# Patient Record
Sex: Male | Born: 1955 | Race: White | Hispanic: No | Marital: Married | Smoking: Never smoker
Health system: Southern US, Community
[De-identification: ages and names within clinical notes are randomized; demographics above are authoritative.]

## PROBLEM LIST (undated history)

## (undated) DIAGNOSIS — E669 Obesity, unspecified: Secondary | ICD-10-CM

## (undated) DIAGNOSIS — R609 Edema, unspecified: Secondary | ICD-10-CM

## (undated) DIAGNOSIS — G629 Polyneuropathy, unspecified: Secondary | ICD-10-CM

## (undated) DIAGNOSIS — R269 Unspecified abnormalities of gait and mobility: Secondary | ICD-10-CM

## (undated) DIAGNOSIS — Z87898 Personal history of other specified conditions: Secondary | ICD-10-CM

## (undated) DIAGNOSIS — K859 Acute pancreatitis without necrosis or infection, unspecified: Secondary | ICD-10-CM

## (undated) DIAGNOSIS — H269 Unspecified cataract: Secondary | ICD-10-CM

## (undated) DIAGNOSIS — G919 Hydrocephalus, unspecified: Secondary | ICD-10-CM

## (undated) DIAGNOSIS — G4733 Obstructive sleep apnea (adult) (pediatric): Secondary | ICD-10-CM

## (undated) DIAGNOSIS — M545 Low back pain, unspecified: Secondary | ICD-10-CM

## (undated) DIAGNOSIS — K831 Obstruction of bile duct: Secondary | ICD-10-CM

## (undated) DIAGNOSIS — F5104 Psychophysiologic insomnia: Secondary | ICD-10-CM

## (undated) DIAGNOSIS — I10 Essential (primary) hypertension: Secondary | ICD-10-CM

## (undated) DIAGNOSIS — Z85820 Personal history of malignant melanoma of skin: Secondary | ICD-10-CM

## (undated) DIAGNOSIS — R600 Localized edema: Secondary | ICD-10-CM

## (undated) DIAGNOSIS — Z9989 Dependence on other enabling machines and devices: Secondary | ICD-10-CM

## (undated) DIAGNOSIS — G8929 Other chronic pain: Secondary | ICD-10-CM

## (undated) HISTORY — DX: Obesity, unspecified: E66.9

## (undated) HISTORY — DX: Dependence on other enabling machines and devices: Z99.89

## (undated) HISTORY — DX: Low back pain: M54.5

## (undated) HISTORY — PX: CHOLECYSTECTOMY: SHX55

## (undated) HISTORY — DX: Acute pancreatitis without necrosis or infection, unspecified: K85.90

## (undated) HISTORY — DX: Polyneuropathy, unspecified: G62.9

## (undated) HISTORY — DX: Hydrocephalus, unspecified: G91.9

## (undated) HISTORY — DX: Localized edema: R60.0

## (undated) HISTORY — DX: Obstruction of bile duct: K83.1

## (undated) HISTORY — PX: CARPAL TUNNEL RELEASE: SHX101

## (undated) HISTORY — DX: Edema, unspecified: R60.9

## (undated) HISTORY — DX: Psychophysiologic insomnia: F51.04

## (undated) HISTORY — DX: Obstructive sleep apnea (adult) (pediatric): G47.33

## (undated) HISTORY — DX: Other chronic pain: G89.29

## (undated) HISTORY — PX: LUMBAR SPINE SURGERY: SHX701

## (undated) HISTORY — DX: Personal history of malignant melanoma of skin: Z85.820

## (undated) HISTORY — PX: TONSILLECTOMY: SUR1361

## (undated) HISTORY — DX: Essential (primary) hypertension: I10

## (undated) HISTORY — DX: Unspecified abnormalities of gait and mobility: R26.9

## (undated) HISTORY — PX: INGUINAL HERNIA REPAIR: SUR1180

## (undated) HISTORY — DX: Personal history of other specified conditions: Z87.898

## (undated) HISTORY — DX: Low back pain, unspecified: M54.50

---

## 1999-05-10 ENCOUNTER — Ambulatory Visit (HOSPITAL_COMMUNITY): Admission: RE | Admit: 1999-05-10 | Discharge: 1999-05-10 | Payer: Self-pay | Admitting: Emergency Medicine

## 1999-05-10 ENCOUNTER — Encounter: Payer: Self-pay | Admitting: Emergency Medicine

## 1999-06-16 ENCOUNTER — Encounter: Payer: Self-pay | Admitting: Neurosurgery

## 1999-06-18 ENCOUNTER — Observation Stay (HOSPITAL_COMMUNITY): Admission: RE | Admit: 1999-06-18 | Discharge: 1999-06-19 | Payer: Self-pay | Admitting: Neurosurgery

## 1999-06-18 ENCOUNTER — Encounter: Payer: Self-pay | Admitting: Neurosurgery

## 1999-10-15 ENCOUNTER — Ambulatory Visit (HOSPITAL_COMMUNITY): Admission: RE | Admit: 1999-10-15 | Discharge: 1999-10-15 | Payer: Self-pay | Admitting: Neurosurgery

## 1999-10-15 ENCOUNTER — Encounter: Payer: Self-pay | Admitting: Neurosurgery

## 2000-12-07 ENCOUNTER — Inpatient Hospital Stay (HOSPITAL_COMMUNITY): Admission: EM | Admit: 2000-12-07 | Discharge: 2000-12-08 | Payer: Self-pay | Admitting: *Deleted

## 2000-12-07 ENCOUNTER — Encounter: Payer: Self-pay | Admitting: Emergency Medicine

## 2001-06-26 ENCOUNTER — Inpatient Hospital Stay (HOSPITAL_COMMUNITY): Admission: EM | Admit: 2001-06-26 | Discharge: 2001-06-28 | Payer: Self-pay | Admitting: Emergency Medicine

## 2001-09-22 ENCOUNTER — Ambulatory Visit (HOSPITAL_BASED_OUTPATIENT_CLINIC_OR_DEPARTMENT_OTHER): Admission: RE | Admit: 2001-09-22 | Discharge: 2001-09-22 | Payer: Self-pay | Admitting: Orthopedic Surgery

## 2001-11-24 ENCOUNTER — Emergency Department (HOSPITAL_COMMUNITY): Admission: EM | Admit: 2001-11-24 | Discharge: 2001-11-24 | Payer: Self-pay | Admitting: Emergency Medicine

## 2002-06-06 ENCOUNTER — Encounter: Payer: Self-pay | Admitting: Emergency Medicine

## 2002-06-06 ENCOUNTER — Inpatient Hospital Stay (HOSPITAL_COMMUNITY): Admission: EM | Admit: 2002-06-06 | Discharge: 2002-06-11 | Payer: Self-pay | Admitting: Emergency Medicine

## 2002-06-07 ENCOUNTER — Encounter: Payer: Self-pay | Admitting: Internal Medicine

## 2003-12-27 ENCOUNTER — Inpatient Hospital Stay (HOSPITAL_COMMUNITY): Admission: EM | Admit: 2003-12-27 | Discharge: 2004-01-01 | Payer: Self-pay | Admitting: Emergency Medicine

## 2004-05-06 ENCOUNTER — Inpatient Hospital Stay (HOSPITAL_COMMUNITY): Admission: EM | Admit: 2004-05-06 | Discharge: 2004-05-13 | Payer: Self-pay | Admitting: Emergency Medicine

## 2004-05-11 ENCOUNTER — Encounter (INDEPENDENT_AMBULATORY_CARE_PROVIDER_SITE_OTHER): Payer: Self-pay | Admitting: *Deleted

## 2004-05-22 ENCOUNTER — Ambulatory Visit (HOSPITAL_BASED_OUTPATIENT_CLINIC_OR_DEPARTMENT_OTHER): Admission: RE | Admit: 2004-05-22 | Discharge: 2004-05-22 | Payer: Self-pay | Admitting: Family Medicine

## 2005-04-03 ENCOUNTER — Inpatient Hospital Stay (HOSPITAL_COMMUNITY): Admission: EM | Admit: 2005-04-03 | Discharge: 2005-04-06 | Payer: Self-pay | Admitting: Internal Medicine

## 2006-04-11 ENCOUNTER — Encounter: Admission: RE | Admit: 2006-04-11 | Discharge: 2006-05-11 | Payer: Self-pay | Admitting: Neurology

## 2009-03-06 ENCOUNTER — Ambulatory Visit (HOSPITAL_COMMUNITY): Admission: RE | Admit: 2009-03-06 | Discharge: 2009-03-06 | Payer: Self-pay | Admitting: General Surgery

## 2009-04-08 ENCOUNTER — Encounter (INDEPENDENT_AMBULATORY_CARE_PROVIDER_SITE_OTHER): Payer: Self-pay | Admitting: General Surgery

## 2009-04-08 ENCOUNTER — Ambulatory Visit (HOSPITAL_COMMUNITY): Admission: RE | Admit: 2009-04-08 | Discharge: 2009-04-08 | Payer: Self-pay | Admitting: General Surgery

## 2009-12-01 ENCOUNTER — Inpatient Hospital Stay (HOSPITAL_COMMUNITY): Admission: EM | Admit: 2009-12-01 | Discharge: 2009-12-05 | Payer: Self-pay | Admitting: Emergency Medicine

## 2009-12-03 ENCOUNTER — Encounter (INDEPENDENT_AMBULATORY_CARE_PROVIDER_SITE_OTHER): Payer: Self-pay | Admitting: General Surgery

## 2009-12-03 ENCOUNTER — Encounter: Payer: Self-pay | Admitting: General Surgery

## 2011-01-06 LAB — COMPREHENSIVE METABOLIC PANEL
ALT: 102 U/L — ABNORMAL HIGH (ref 0–53)
ALT: 139 U/L — ABNORMAL HIGH (ref 0–53)
ALT: 95 U/L — ABNORMAL HIGH (ref 0–53)
AST: 38 U/L — ABNORMAL HIGH (ref 0–37)
Albumin: 3.3 g/dL — ABNORMAL LOW (ref 3.5–5.2)
Albumin: 3.7 g/dL (ref 3.5–5.2)
Alkaline Phosphatase: 101 U/L (ref 39–117)
Alkaline Phosphatase: 68 U/L (ref 39–117)
BUN: 12 mg/dL (ref 6–23)
BUN: 12 mg/dL (ref 6–23)
BUN: 8 mg/dL (ref 6–23)
CO2: 28 mEq/L (ref 19–32)
Calcium: 8.3 mg/dL — ABNORMAL LOW (ref 8.4–10.5)
Calcium: 8.8 mg/dL (ref 8.4–10.5)
Chloride: 103 mEq/L (ref 96–112)
Creatinine, Ser: 0.95 mg/dL (ref 0.4–1.5)
GFR calc Af Amer: >60 mL/min (ref >60)
GFR calc non Af Amer: >60 mL/min (ref >60)
Glucose, Bld: 115 mg/dL — ABNORMAL HIGH (ref 70–99)
Potassium: 3.8 mEq/L (ref 3.5–5.1)
Potassium: 4.1 mEq/L (ref 3.5–5.1)
Sodium: 135 mEq/L (ref 135–145)
Total Bilirubin: 0.8 mg/dL (ref 0.3–1.2)
Total Protein: 6.6 g/dL (ref 6.0–8.3)
Total Protein: 7.3 g/dL (ref 6.0–8.3)

## 2011-01-06 LAB — CBC
HCT: 40.9 % (ref 39.0–52.0)
HCT: 41.4 % (ref 39.0–52.0)
Hemoglobin: 14.2 g/dL (ref 13.0–17.0)
MCHC: 34.2 g/dL (ref 30.0–36.0)
MCV: 96.2 fL (ref 78.0–100.0)
Platelets: 207 10*3/uL (ref 150–400)
RDW: 13.6 % (ref 11.5–15.5)
RDW: 13.6 % (ref 11.5–15.5)
RDW: 13.9 % (ref 11.5–15.5)
WBC: 10.5 10*3/uL (ref 4.0–10.5)
WBC: 7.9 10*3/uL (ref 4.0–10.5)

## 2011-01-06 LAB — URINALYSIS, ROUTINE W REFLEX MICROSCOPIC
Bilirubin Urine: NEGATIVE
Hgb urine dipstick: NEGATIVE
Ketones, ur: NEGATIVE mg/dL
Nitrite: NEGATIVE
Protein, ur: NEGATIVE mg/dL
pH: 7 (ref 5.0–8.0)

## 2011-01-06 LAB — PROTIME-INR
INR: 1.22 (ref 0.00–1.49)
Prothrombin Time: 15.3 seconds — ABNORMAL HIGH (ref 11.6–15.2)

## 2011-01-06 LAB — DIFFERENTIAL
Basophils Relative: 1 % (ref 0–1)
Eosinophils Relative: 2 % (ref 0–5)
Lymphocytes Relative: 10 % — ABNORMAL LOW (ref 12–46)
Lymphs Abs: 1 10*3/uL (ref 0.7–4.0)
Monocytes Absolute: 0.3 10*3/uL (ref 0.1–1.0)
Neutro Abs: 8.9 10*3/uL — ABNORMAL HIGH (ref 1.7–7.7)

## 2011-01-06 LAB — AMYLASE: Amylase: 39 U/L (ref 0–105)

## 2011-01-06 LAB — GLUCOSE, CAPILLARY: Glucose-Capillary: 108 mg/dL — ABNORMAL HIGH (ref 70–99)

## 2011-01-06 LAB — LIPASE, BLOOD: Lipase: 97 U/L — ABNORMAL HIGH (ref 11–59)

## 2011-01-08 LAB — COMPREHENSIVE METABOLIC PANEL
AST: 49 U/L — ABNORMAL HIGH (ref 0–37)
Albumin: 3.2 g/dL — ABNORMAL LOW (ref 3.5–5.2)
CO2: 26 mEq/L (ref 19–32)
Chloride: 102 mEq/L (ref 96–112)
GFR calc Af Amer: >60 mL/min (ref >60)
GFR calc non Af Amer: >60 mL/min (ref >60)
Potassium: 4.2 mEq/L (ref 3.5–5.1)
Sodium: 134 mEq/L — ABNORMAL LOW (ref 135–145)
Total Bilirubin: 1.6 mg/dL — ABNORMAL HIGH (ref 0.3–1.2)

## 2011-01-08 LAB — DIFFERENTIAL
Basophils Absolute: 0 10*3/uL (ref 0.0–0.1)
Basophils Relative: 0 % (ref 0–1)
Monocytes Relative: 6 % (ref 3–12)
Neutro Abs: 9.2 10*3/uL — ABNORMAL HIGH (ref 1.7–7.7)

## 2011-01-08 LAB — CBC: Hemoglobin: 13.8 g/dL (ref 13.0–17.0)

## 2011-01-14 ENCOUNTER — Other Ambulatory Visit: Payer: Self-pay

## 2011-01-25 LAB — COMPREHENSIVE METABOLIC PANEL
ALT: 35 U/L (ref 0–53)
AST: 25 U/L (ref 0–37)
CO2: 26 mEq/L (ref 19–32)
Chloride: 107 mEq/L (ref 96–112)
Creatinine, Ser: 0.85 mg/dL (ref 0.4–1.5)
GFR calc Af Amer: >60 mL/min (ref >60)
GFR calc non Af Amer: >60 mL/min (ref >60)
Total Bilirubin: 0.9 mg/dL (ref 0.3–1.2)

## 2011-01-25 LAB — CBC
HCT: 48.3 % (ref 39.0–52.0)
MCHC: 33.6 g/dL (ref 30.0–36.0)
MCV: 95.9 fL (ref 78.0–100.0)
Platelets: 186 10*3/uL (ref 150–400)
RBC: 5.04 MIL/uL (ref 4.22–5.81)
RDW: 13.8 % (ref 11.5–15.5)

## 2011-01-25 LAB — DIFFERENTIAL
Basophils Absolute: 0 10*3/uL (ref 0.0–0.1)
Eosinophils Absolute: 0.5 10*3/uL (ref 0.0–0.7)
Eosinophils Relative: 6 % — ABNORMAL HIGH (ref 0–5)
Lymphocytes Relative: 23 % (ref 12–46)

## 2011-02-03 ENCOUNTER — Other Ambulatory Visit: Payer: Self-pay

## 2011-02-12 ENCOUNTER — Ambulatory Visit
Admission: RE | Admit: 2011-02-12 | Discharge: 2011-02-12 | Disposition: A | Discharge status: Home / Self Care | Payer: Medicare Other | Source: Ambulatory Visit | Attending: Family Medicine | Admitting: Family Medicine

## 2011-02-12 ENCOUNTER — Other Ambulatory Visit: Payer: Self-pay | Admitting: Family Medicine

## 2011-02-12 DIAGNOSIS — R52 Pain, unspecified: Secondary | ICD-10-CM

## 2011-03-02 NOTE — Op Note (Signed)
[name], [name]                  ACCOUNT NO.:  [id number]   MEDICAL RECORD NO.:  [id number]          PATIENT TYPE:  AMB   LOCATION:  SDS                          FACILITY:  [facility]   PHYSICIAN:  Cherylynn Ridges, M.D.    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   PROCEDURES:  1. Excision of left upper back melanoma.  2. Excision of right upper back spreading pigmented lesion.  3. Left axillary sentinel lymph node biopsy.  4. Lymphatic mapping.   SURGEON:  Marta Lamas. Lindie Spruce, MD   ANESTHESIA:  General endotracheal.   ESTIMATED BLOOD LOSS:  Less than 50 mL.  It was done in a prone position  and placed back into the supine position.   COMPLICATIONS:  None.   CONDITION:  Stable.   FINDINGS:  Positive by NeoProbe sentinel lymph node with high counts and  some pigmentation from Lymphazurin.   INDICATIONS FOR OPERATION:  The patient is a 55 year old gentleman with  a diagnosis of melanoma of the left back.  He comes in for excision of  that plus a suspicious lesion in the right upper back.   OPERATION IN DETAIL:  The patient was taken to the operating room and  placed on table initially in the supine position.  After an adequate  endotracheal anesthetic was administered, he was flipped over into the  prone position on the table for preparation and injection with  Lymphazurin around the melanotic lesion.   The lesion on his left upper back was identified and we injected 5 mL of  Lymphazurin in the upper and lateral border of the lesion.  We rubbed  that in using a sponge and  then we went to excise the lesion on the  right back which was a speckled, fading pigmented lesion resembling a  superficial spreading melanoma.   We marked the borders which were approximately 1 cm-1.5 cm from the edge  of the pigmented lesion.  We made an oval incision using #10 blade and  taken down to the muscle fascia.  We used electrocautery to cut off the  lesion down to the fascia and then excised it completely.  We marked the  medial border and a deep border with a nylon suture.  The longer suture  being on the medial border.  We obtained hemostasis with electrocautery.  Then we closed in 2 layers of 3-0 Vicryl deep layer, then the skin was  closed using interrupted simple stitches of 3-0 nylon.  The total length  of the incision on the right side was about 13 cm long.   We excised the melanotic lesion in a similar manner getting a 2-cm  margin at least circumferentially.  We excised it out to the deep  fascia, however, our initial borders were somewhat slanted inward,  therefore we took the wall more lateral and deep margin.  We excised  down to the fascia, obtained hemostasis with electrocautery, and then  closed in 2 layers of 3-0 Vicryl deep layer and then the skin using  interrupted simple stitches of 2-0 nylon.  The total length of this was  approximately 15 cm.   Once we had completely excised these and placed Betadine ointment, 4 x  4s, and Hypafix dressing on both these lesions, we flipped the patient  on into the supine position with his left arm extended in 90 degrees to  his body.  We then prepped in usual manner with ChloraPrep and then  draped.  We identified the area of hyperactivity with the NeoProbe in  the lower portion of the left axilla.  We made an incision transversely  using the #15 blade and dissected into the subcu using electrocautery.  We used a Neoprobe to guide [place] to the highest level of activity where we  did find a group of lymph nodes which had  the highest activity of over  500 counts with the gain being 100.  No other areas had similar activity  once this area had been removed and it continued to show high level of  activity after it was removed.   What was removed appeared to be a group of about 3 lymph nodes, however,  the highest activity was within this group.  We obtained hemostasis with  electrocautery, then closed with 3-0 Vicryl deep subcu layer, and then  the skin was closed using stainless steel staples.  Betadine ointment, 4  x 4s, and Hypafix dressing was applied.  All counts were correct  including needles, sponges, and instruments.      Cherylynn Ridges, M.D.  Electronically Signed     JOW/MEDQ  D:  04/08/2009  T:  04/09/2009  Job:  [id number]   cc:   Joni Fears D. Maple Hudson, MD, FCCP, FACP

## 2011-03-05 NOTE — Discharge Summary (Signed)
[name], [name]                              ACCOUNT NO.:  [id number]   MEDICAL RECORD NO.:  [id number]                   PATIENT TYPE:  INP   LOCATION:  3030                                 FACILITY:  [facility]   PHYSICIAN:  Isla Pence, M.D.             DATE OF BIRTH:  [birth year]   DATE OF ADMISSION:  05/06/2004  DATE OF DISCHARGE:  05/13/2004                                 DISCHARGE SUMMARY   DISCHARGE DIAGNOSES:  1. Left lower extremity cellulitis secondary to chronic venous stasis.  2. Atypical chest pain, ruled out for myocardial infarction and pulmonary     embolus.  3. Question of obstructive sleep apnea or other sleep disorder.  Recommend     outpatient referral to pulmonary for workup of this.  4. History of previous deep venous thrombosis with cellulitis.  5. History of hypertension.  6. History of lumbar disc disease, status post surgery in 2000, at L1-L2 and     also at S1, currently with disease of the L2-L3 and L4-L5 that Dr. Luiz Blare     is following him up on.  The patient has chronic neuropathy as a result     of this lumbar disc disease and chronic sense of paresthesias in his     lower extremity.  7. Previous history of heme positive stools with colonoscopy done in     [place], [place], by Dr. Donnie Coffin, all of which were apparently     normal.  In 2003, it was followed by a barium enema which was apparently     also negative.  Positive findings were for diverticulosis.  8. History of status post tonsillectomy.  9. Left hydrocele repair.  10.      Surgery to L1-L2 and S1 in 2000.  11.      Status post left inguinal repair in 1997.  12.      History of insomnia.   DISCHARGE MEDICATIONS:  1. Keflex 500 mg p.o. q.i.d. x3 more days.  This will complete a 10 day     course of antibiotics for his left lower extremity cellulitis.  2. Lasix 40 mg p.o. q.d.  This is an increased dose from his previous of 20.  3. Toprol XL 100 mg p.o. q.d.  4. Altace 10 mg  p.o. b.i.d.  This is a decrease from 15 mg p.o. b.i.d. that     he came in with.  5. Neurontin 300 mg tablets, three tablets p.o. q.d. either q.a.m. or     q.h.s., it depends on him.  6. Elavil 25 mg two tablets p.o. q.h.s.  7. Ambien 5 mg p.o. every other day per patient.  8. Celebrex 400 mg p.o. q.d. with meals as before that was prescribed by Dr.     Luiz Blare.  9. Ultram 100 mg p.o. q.6h. as before.  10.      Triamcinolone cream as before.  He is to use it sparingly whenever     he has crusting on his left lower extremity.  He has been advised against     using it chronically every day.  11.      Prescription for Protonix 40 mg p.o. q.d. in case his stomach gets     upset while on Celebrex.  12.      Small hiatal hernia as seen on computed tomography of chest.   ACTIVITY:  The patient is to keep his lower extremities up when seated or in  bed.  I have also advised him not to wear his compression stockings until he  is done with his antibiotics.   DIET:  Low salt diet.   FOLLOW UP:  Follow up with Dr. Theresia Lo next week.  He will need a potassium  checked at that time so a BMET will need to be done.  Also, suggest referral  to pulmonary for workup of possible sleep apnea.   HOSPITAL COURSE:  This 55 year old gentleman, who is morbidly obese with  previous history of DVT in his lower extremity with chronic venous stasis,  comes in with complaints of atypical chest pain and also increasing warmth  and swelling in his left leg.  Please refer to the initial H&P for details  on the presentation of that.  The patient was brought in and started on IV  antibiotics.  He was initially on ceftriaxone and subsequently switched to  IV Kefzol which he responded nicely with.  He was then subsequently switched  to oral Keflex and has continued to respond to that.  His initial white  count was 25.9, H&H 17.2 and 49.9, platelet count 155,000.  On his  differential at the time of admission, he had  95% neutrophils and 3% lymphs.  His last white count done on the day of discharge after being on oral Keflex  for a few days shows a white count of 7.5, H&H 14.7 and 42.8, platelet count  of 242,000.  As part of his workup for his lower extremity swelling with  chronic venous stasis, he did have CT of his extremity as part of the  protocol for rule out PE which was not conclusive for excluding DVT  secondary to the swelling.  Therefore, a Doppler ultrasound was done which  showed no evidence of acute DVT bilaterally from the upper calf to the  common femoral vein.  Proximal to mid calf veins were not clearly visualized  secondary to swelling.  There was old fluid collection noted at the distal  medial ankle.  We felt overall comfortable that we were not dealing with an  acute DVT since this is a recurrent problem for the patient.  He was,  however, placed on Lovenox protocol initially and at the time of discharge,  he will be taken off of the Lovenox.  The patient was started on IV  diuretics to diurese some of his lower extremity edema which will help in  the long run with his cellulitis.  He was subsequently switched to oral  Lasix.   In regards to his atypical chest pain, the patient was ruled out for  myocardial infarction using serial cardiac enzymes and all were fairly  unremarkable with his highest CPK being 127 and the highest troponin being  0.01.  In light of his morbid obesity and his chronic venous stasis and his  previous DVT, spiral CT of the chest for PE protocol was obtained and that  was negative for pulmonary embolism.  Noted on the CT was a small hiatal  hernia.  A few days into his stay, the patient apparently got a little  wheezy and it was felt that maybe he was having cardiac asthma.  It was  during that time that the possibility of obstructive sleep apnea was obtained.  The patient apparently snores and this was verified by the wife  and that he does not have a  good night rest.  He actually, in fact, takes  Ambien and Elavil to help him with sleep.  We went ahead and did a 2D  echocardiogram to see if there was any suggestion of right ventricular  dysfunction or left ventricular diastolic dysfunction or any suggestion of  pulmonary hypertension.  Overall, the left ventricular systolic function was  normal.  His left ventricular size was normal.  His left ventricular  ejection fraction was 55-65%.  It was an inadequate study for left  ventricular regional wall motion which we were not looking for.  His left  ventricular wall thickness was at the upper limits of normal.  There was no  significant aortic valve disease noted.  There was no AI or AS.  There was  trivial mitral regurgitation.  The right ventricle was not visualized  clearly and the pulmonic valve was not visualized properly either.  Left  atrial size was on the upper limits of normal.  Based on this, we felt that  this was not likely cardiac in origin and that he probably might have a  component of obstructive sleep apnea.  Dr. Nehemiah Settle had given him 80 mg of  Lasix IV and he responded very nicely with good diuresis and was better for  that period of time.  He was on nebulizer treatments.  At the time of  discharge, the patient is feeling fine.  Based on this information and to  get a little bit more adequate diuresis of this legs and if he does have  pulmonary hypertension, which was not mentioned on the echocardiogram, his  Lasix dose is increased to 40 mg p.o. q.d.  Because his blood pressures had  come down with this regimen, his Altace was decreased down to 10 mg p.o.  b.i.d.  This can be modified by Dr. Theresia Lo.   In regards to his hypertension, as mentioned earlier because of the Lasix  dose being increased, we went ahead and decreased his Altace down since his  blood pressure was trending down.  At the time of discharge, in the morning,  his blood pressure is 104/54 and the  patient is feeling fine.   The patient, in spite of his diuresis, did need slight potassium  supplementation because of the IV Lasix.  He was then switched to oral Lasix  and without any additional supplementation, his potassium is keeping up  nicely with his BMET on July 26, showing a sodium of 134, potassium 4.1,  chloride 97, CO2 27, glucose 117, BUN 12, creatinine 0.9 with a calcium of  8.6.   The patient did get two sets of blood cultures at the time of admission and  these have remained negative x5 days.  The patient was given prescriptions  for all of his medications except for Celebrex which he says he has.  I will  also give him his prescription for Ultram at least to cover x2 weeks until  he sees Dr. Theresia Lo next week.  Isla Pence, M.D.    RRV/MEDQ  D:  05/13/2004  T:  05/13/2004  Job:  [id number]  cc:   Vikki Ports, M.D.  [address] [place] [place]  [place]  [place] [address]  Fax: [phone]   Harvie Junior, M.D.  [address]  [place]  [place] [address]  Fax: [phone]

## 2011-03-05 NOTE — Procedures (Signed)
[name], [name]                  ACCOUNT NO.:  [id number]   MEDICAL RECORD NO.:  [id number]          PATIENT TYPE:  OUT   LOCATION:  SLEEP CENTER                 FACILITY:  [facility]   PHYSICIAN:  Clinton D. Maple Hudson, M.D. DATE OF BIRTH:  10/31/1955   DATE OF ADMISSION:  05/22/2004  DATE OF DISCHARGE:  05/22/2004                              NOCTURNAL POLYSOMNOGRAM   REFERRING PHYSICIAN:  Dr. Lanell Persons   INDICATION FOR STUDY:  Hypersomnia with sleep apnea.   NECK SIZE:  17.5 inches   BODY MASS INDEX:  38.3   WEIGHT:  290 pounds   EPWORTH SLEEPINESS SCORE:  9/24   MEDICATIONS LISTED:  Altace, Toprol, Protonix, Lasix, Ultram, Elavil,  Celebrex, aspirin.   SLEEP ARCHITECTURE:  Total sleep time recorded 351 minutes for sleep  efficiency of 71%.  Stage I was 11%.  Stage II 45%.  Stages III and IV 3%.  REM was 12% of total sleep time.  Latency to sleep onset 42 minutes.  Latency to REM 207 minutes.  Awake after sleep onset 106 minutes reflecting  sustained waking for about two hours after midnight.  Arousal index was 31x  per hour.  The technician indicated that the patient had difficulty  maintaining sleep, but does not state whether he took his Elavil at bedtime.   RESPIRATORY DATA:  Split study protocol.  RDI 55 per hour consistent with  severe obstructive sleep apnea/hypopnea syndrome before CPAP.  This included  49 obstructive apneas and 44 hypopneas before CPAP.  Events were not  positional.  REM RDI was 3 per hour.  CPAP was titrated to 12 CDP, RDI 0 per  hour.  A large comfort curve mask was used with heated humidifier.  He  tolerated the CPAP pressure well, but could not maintain sleep easily with  this initial exposure to CPAP.   OXYGEN DATA:  Moderate snoring before CPAP with moderate to severe oxygen  desaturation to 77%.  At final CPAP pressures, oxygenation was being  maintained at 92-93%.  Baseline room air saturation while awake were 97%.   CARDIAC DATA:   Normal cardiac rhythm.   MOVEMENT/PARASOMNIA:  Occasional leg jerks with insignificant effect on  sleep.   IMPRESSION/RECOMMENDATION:  Severe obstructive sleep apnea/hypopnea  syndrome, RDI 55 per hour.  Good control at CPAP 12 CWP using a large  comfort curve mask with heated humidifier.  Difficulty maintaining sleep  before and after CPAP application.  Suggest providing sedative hypnotic  sleep medication during initial home trial of CPAP if appropriate.                                   ______________________________                                Rennis Chris. Maple Hudson, M.D.                                Diplomate, American  Board of Sleep Medicine    CDY/MEDQ  D:  05/31/2004 10:49:37  T:  05/31/2004 23:16:19  Job:  [id number]

## 2011-03-05 NOTE — H&P (Signed)
NAMEOSHEN, WLODARCZYK                              ACCOUNT NO.:  [id number]   MEDICAL RECORD NO.:  [id number]                   PATIENT TYPE:  EMS   LOCATION:  MINO                                 FACILITY:  [facility]   PHYSICIAN:  Isla Pence, M.D.             DATE OF BIRTH:  11-02-1955   DATE OF ADMISSION:  12/26/2003  DATE OF DISCHARGE:                                HISTORY & PHYSICAL   IDENTIFICATION STATEMENT:  This is a 55 year old gentleman whose primary  care physician is Dr. Theresia Lo.   CHIEF COMPLAINT:  Swelling and redness in the left leg times one day.   HISTORY OF PRESENT ILLNESS:  This is a gentleman with previous history of  left lower extremity cellulitis complicated with bacteremia with Strep  agalactiae in August 2003, and with history of chronic venous stasis comes  to the emergency room on the evening of March 10 with complaints of redness  and swelling.  The initial redness and swelling started out in the morning  of March 10, and since it progressed further, he decided to come on in  knowing his previous history.  He denies any previous injury, no known  fever.  He has paresthesias in his legs; therefore, he has no feelings in  his legs to complain of any pain.  He just has felt a sense of tightness in  his ankles.  The patient, also of significance, does have history of DVT in  both legs and that was diagnosed in early 2004 by Doppler ultrasound and he  was on Lovenox for six months.  He has had no other recurrence since then.   The patient denies any complaints of shortness of breath or chest pain,  although he says he has a sense of chest pressure when he lays on his right  to left side but that has resolved.   ALLERGIES:  No known drug allergies.   CURRENT MEDICATIONS:  1. Altace 10 mg p.o. b.i.d.  2. Norvasc 5 mg and he takes two p.o. daily. He has been on Norvasc for the     past year, he said.  3. Toprol XL 50 mg two p.o. daily.  4.  Hydrochlorothiazide 25 mg p.o. daily.  5. Neurontin 300 mg size tablets.  He takes one p.o. q.a.m., two tablets     p.o. at noon, 5 p.o. q.h.s.  6. Elavil 25 mg two tablets p.o. at night for sleep.  7. Ambien 5 mg one every other day at bedtime.  8. He stopped Celebrex recently for his chronic pain also.   PAST MEDICAL HISTORY:  1. As mentioned earlier, in August 2003, he was admitted for the left lower     extremity cellulitis with positive blood cultures for Strep agalactiae at     that time.  He was initially on Ancef and then subsequently switched to     ceftriaxone and  left him on Ceftin.  2. History of hypertension with his Toprol apparently just being recent     increased by Dr. Theresia Lo.  3. History of lumbar disk disease, status post surgery to the back pain,     with chronic pain in his back.  4. He was admitted in February 2002 with chest pains and underwent     Cardiolite and cardiac catheterization by Dr. Fraser Din which was     essentially negative for any coronary artery disease.  He had an EF of     70% at that time.  5. He denies history of irritable bowel, although there is mention of it in     his E-chart, but he definitely denies this.  6. History of insomnia.  7. History of heme-positive stool with colonoscopies four different times in     [place] by Dr. Donnie Coffin.  Last one was done in 2003 that had to be     followed with barium enema study because the prep was not fully clean,     but in any case all of his colonoscopies were negative for polyps or     malignancies.  Was positive just for hemorrhoids and diverticulosis.  8. As mentioned earlier, history of DVT diagnosed in early 2004 in both his     legs.  Was on Lovenox for six months.   PAST SURGICAL HISTORY:  1. He is status post tonsillectomy.  2. Status post left hydrocele repair.  3. Status post back surgery to the lumbar area back in August 2000.  4. Status post left inguinal hernia repair in 1977.    SOCIAL HISTORY:  He has been married for the past 20 years.  He has two  children, a boy and a girl.  Denies tobacco. He has an occasional  cigarette when guys have a baby.  May have an occasional glass of wine but  does not make a habit of drinking alcohol.  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   REVIEW OF SYSTEMS:  As per HPI, but otherwise denies any change in his  urinary symptoms, it sounds like related to his nocturia, and this can be  further evaluated by Dr. Theresia Lo.  Otherwise denies any melena,  hematochezia.   PHYSICAL EXAMINATION:  VITAL SIGNS:  His initial blood pressure was 159/93,  pulse 104, respiratory rate 24, temperature 97.6.  Subsequent blood pressure  138/83 and a heart rate of 85.  Most recent one which I think may have been  taken when he was asleep showed a blood pressure of 90/40 which I do not  really think is correct for him.  His saturations have been running 94-97%.  GENERAL:  He is in no apparent distress.  He is an obese gentleman, very  pleasant man.  LUNGS:  Clear to auscultation bilaterally without any crackles or wheezes.  HEART:  Regular rate and rhythm without any murmurs or rubs.  ABDOMEN:  Protuberant, large, obese abdomen.  Bowel sounds are normal.  Soft, nontender.  No appreciable organomegaly.  EXTREMITIES:  Lower extremity exam is chronic venous stasis changes in both  legs.  Left lower extremity has area of redness, just slightly below the  knee but involving the calf and the shin areas and maybe slightly into the  ankle.  It is certainly warm to touch but he cannot feel pain.   LABORATORY DATA:  He had a white count of 6.3, hemoglobin 16.2, hematocrit  46.9, platelet count  222,000.  He had normal differential.  His BMP showed a  sodium of 138, potassium 3.6, chloride and CO2 99 and 32, BUN 16, creatinine  1.2, glucose 95.   ASSESSMENT/PLAN: 1. Left lower extremity cellulitis with chronic venous stasis with a history      of deep venous thrombosis.  Will admit him for IV antibiotics.  He was     given Avelox in the emergency room but will use ceftriaxone on him since     he had done well with that in the past.  Will also set him up for a     Doppler of the lower extremities to rule out a deep venous thrombosis.     If that becomes positive for a new deep venous thrombosis, then he buys     himself chronic long-term anticoagulation, especially with his chronic     venous stasis.   Will use Lovenox subcutaneously until the deep venous thrombosis is ruled  out or he becomes a little bit more ambulatory.  Will also discontinue the  Norvasc since it can cause venous stasis and he is already having problems  with it.  Blood cultures have not been done at this time since his white  count really is essentially normal in the past when he came in 2003 and when  is white count was significantly elevated.   1. History of hypertension.  Since I am discontinuing the Norvasc, will     increase his Altace, continue his other medicines.   1. He had some transient diarrhea which has since resolved.   1. History of chronic pain, neuropathy secondary to disk disease.  Will     continue all of his medications.   1. History of insomnia.  Continue his current medications.   1. Nocturia.  We will leave this to Dr. Theresia Lo to work up as an     outpatient, looking for benign prostatic hypertrophy-like symptoms.                                                Isla Pence, M.D.    RRV/MEDQ  D:  12/27/2003  T:  12/28/2003  Job:  [id number]   cc:   Vikki Ports, M.D.  [address]  [place]  [place] [address]  Fax: [phone]
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
## 2011-03-05 NOTE — H&P (Signed)
NAMEJAMAAL, [name]                              ACCOUNT NO.:  [id number]   MEDICAL RECORD NO.:  [id number]                   PATIENT TYPE:  INP   LOCATION:  1826                                 FACILITY:  [facility]   PHYSICIAN:  [name], M.D.             DATE OF BIRTH:  1956-09-18   DATE OF ADMISSION:  05/06/2004  DATE OF DISCHARGE:                                HISTORY & PHYSICAL   IDENTIFYING INFORMATION/JUSTIFICATION FOR ADMISSION AND CARE:  This is a 55-  year-old gentleman who is morbidly obese whose primary care physician is Dr.  Theresia [name] with [facility], whose orthopedic doctor is Dr. Luiz [name], whose chief  complaint is chest pain and left lower extremity swelling and warmth this  morning.   HISTORY OF PRESENT ILLNESS:  This patient with history of recurrent DVTs was  seen at the [facility] by one of Dr. Aurelio [name] partners.  The patient  apparently was cleaning up after a birthday party of his daughter's when he  started having epigastric/substernal chest pain that he describes as sharp  and breathtaking and this happened this morning.  The pain has persisted in  spite of three nitroglycerin, however, he says it is tender to touch also.  He had nausea with it and also shortness of breath and diaphoresis.  Of  significance is the fact that he did have a cardiac catheterization done in  2002 by Dr. Fraser [name] and that showed normal coronaries and this was done  after he had episodes of chest pain or shortness of breath with an abnormal  Cardiolite.  It was felt that the shortness of breath at that time was  noncardiac in origin.  In terms of cardiac risk factors, he is morbidly  obese.  He has hypertension.  He apparently has high cholesterol for which  he is going through research study at [facility] and is on  nonpharmacotherapy.  He has an occasional cigarette but it is only on  special occasions like when someone has a baby or something and that is what  he told me the time  before.  He is not a diabetic.  He had received  nitroglycerin in the clinic and then one by the EMS people and one in the  emergency room here.  He says it relieved some of his pain but he still has  it persistent.  He describes the severity of the right shoulder pain as  being 7/8 where he says the pain has radiated to the shoulder and into the  posterior neck.  The base of the neck, he says the severity is a 7 to 9, the  chest is a 5 to 6.  The EKG in the clinic apparently showed some new ST  elevation which was apparently new.  The patient also has had some coughing  over the past one week on and off which has been nonproductive, but  denies  any fever, rhinorrhea or sore throat.  The patient took an extra dose of  Altace and Toprol and also Ultram which he says he has had, thinking that it  might help with his pain.  Apparently it did not help much.   In regard to his left lower extremity swelling and warmth to touch, the  daughter noticed that he was warm to touch but he had felt that it was also  swollen today.  He has significant pain all the way from his calf to the  spine but some of this may be related to his lumbar disc disease since he  continues to have pain in his back radiating down to both his legs with  paresthesias.  He denies any fever.   ALLERGIES:  No known drug allergies.   CURRENT MEDICATIONS:  1. Triamcinolone cream 0.1% where he applies a thin smear to his stasis     dermatitis changes.  He says now he only does it maybe twice a week, on a     Monday and a Friday, but not otherwise.  2. Lasix 20 mg p.o. daily.  3. Toprol XL 100 mg p.o. daily.  4. Altace 50 mg p.o. b.i.d.  5. Neurontin 900 mg p.o. daily. He takes it either in the morning or at     bedtime.  6. Elavil 25 mg two tablets p.o. q.h.s.  7. Ambien 5 mg every other day q.h.s.  8. Celebrex 400 mg p.o. daily that was just started on Tuesday by Dr.     Luiz [name], his orthopedic doctor, for his back.    PAST MEDICAL HISTORY:  1. He has had recurrent DVTs and cellulitis of his leg in August of 2003     when he was admitted with the left lower extremity cellulitis, he was     also bacteremic with Strep agalactiae that responded nicely with     ceftriaxone.  He also, as mentioned earlier, has the history of DVTs.  2. History of hypertension.  3. History of lumbar disc disease, status post surgery in 2000 at L1-S2     level, and also at S1.  He currently has disease of his L2-L3 and L4-5     that Dr. Luiz [name] is wanting to do surgery on.  4. He was admitted in February of 2002 with chest pain and underwent     Cardiolite and cardiac catheterization as previously mentioned.  His EF     at that time was 70%.  The patient has history of insomnia.  5. He also has history of heme-positive stools with colonoscopies at four     different times in [place], [place], by Dr. Donnie [name].  The last     one was done in 2003 that was followed by a barium enema study because a     prep was not fully clean, but apparently all of the colonoscopies were     negative.  It was positive only for hemorrhoids and diverticulosis.  6. 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   PAST SURGICAL HISTORY:  1. Status post tonsillectomy.  2. Status post left  hydrocele repair.  3. Status post surgery to the L1-L2 and S1 in 2000.  4. Status post left inguinal hernia repair in 1977.   SOCIAL HISTORY:  He has been married for the  past 20 years.  He has two  children, a boy and a girl.  He denies tobacco, although he may have an  occasional cigarette on very special rare occasions.  He may have an  occasional glass of wine but does not make a habit of drinking alcohol,  according to him.  He works as a Lawyer.  REVIEW OF SYMPTOMS:  As per HPI.  He otherwise denies any change in his  bowel  habits, heartburn, burning on urination or frequency of urination.  He  has continued persistent paresthesias in his leg.   PHYSICAL EXAMINATION:  GENERAL APPEARANCE:  He does not appear to be in any  distress.  VITAL SIGNS:  His blood pressure is 139/61, pulse 88, respiratory rate 20,  temperature 99.9, saturations 97% on room air.  HEENT:  He certainly is very tender along the right shoulder where he was  saying the pain had radiated to.  The rest of the HEENT shows some mild  postnasal drip in the oropharynx.  TMs were normal on the left.  The right  was not clearly visualized secondary to cerumen.  Nose was normal mucosa.  CHEST:  He had chest wall tenderness when I pushed at the epigastric  substernal area and he was actually exquisitely tender.  LUNGS:  Actually clear to auscultation bilaterally without any crackles or  wheezes.  CARDIOVASCULAR:  Regular rate and rhythm.  ABDOMEN:  He has a very protuberant abdomen secondary to morbid obesity,  moderately soft, bowel sounds are present.  He is slightly tender in the  left lower quadrant.  EXTREMITIES:  Lower extremity examination shows he does have some edema at  about 1+.  The left lower extremity certainly is erythematous around his  calf and into his shin area and is certainly warm to touch.  I have marked  out the extent of the erythema.  He has certainly edema there also with some  chronic venostasis changes in the shin area.  He has about 1 to 2+ edema.  I  did not push it all the way down since he is tender.  NEUROLOGIC:  There are no gross deficits.  I did not do a full neurologic  examination since it was not pertinent to this visit.   Chest x-ray was done that showed no acute disease.  There was no pulmonary  vascular congestion either.   An EKG that accompanied him from the clinic shows normal sinus rhythm.  I  think it is early repolarization rather than actual ST elevation.   The rest of his laboratory work-up  shows cardiac markers, the first set is  negative with a troponin of less than 0.5, CK-MB 1.1, his myoglobin is 6.9.  His CBC with differential and electrolyte panels are still pending.  I think  they were just drawn.  The ER physician called me before any labs were  actually done.   ASSESSMENT/PLAN:  1. Patient with atypical chest pain that seems more musculoskeletal with     tenderness on examination, however, with some of his risk factors, will     go ahead and rule him out for rule out myocardial infarction protocol.     Will place him on aspirin, continue his Toprol, place him on oxygen.     Will place him on Lovenox also for two reasons, from his cellulitis and     deep venous thrombosis prophylaxis standpoint, and also as a rule out     myocardial infarction  protocol.  We will also set him up for a CT of the     chest and extremities to rule out pulmonary embolus and deep venous    thrombosis.  I am fairly certain he is going to be negative for an     myocardial infarction.  2. In regard to left lower extremity cellulitis secondary to venostasis,     will once again rule him out for a deep venous thrombosis.  Will place     him on ceftriaxone which he had done well on and the Lovenox as deep     venous thrombosis prophylaxis.  3. In regard to his history of hypertension, we will continue his     medications.  4. In regard to his gastrointestinal prophylaxis, we will place him on     Protonix.  5. In regard to his lumbar disc disease with neuropathy, I am going to hold     his Celebrex while we are ruling him out for an myocardial infarction.     Will place him on morphine sulfate to help with pain.  Will continue his     Neurontin.  6. For his insomnia, will continue his medications.                                                [name], M.D.    RRV/MEDQ  D:  05/06/2004  T:  05/06/2004  Job:  [id number]   cc:   Vikki Ports, M.D.  [address]  [place]  [place] [address]  Fax: [phone]   Harvie Junior, M.D.  [address]  [place]  [place] [address]  Fax: [phone]

## 2011-03-05 NOTE — H&P (Signed)
[name], [name]                  ACCOUNT NO.:  [id number]   MEDICAL RECORD NO.:  [id number]          PATIENT TYPE:  INP   LOCATION:  5710                         FACILITY:  [facility]   PHYSICIAN:  Corinna L. Lendell Caprice, MDDATE OF BIRTH:  Feb 03, 1956   DATE OF ADMISSION:  04/03/2005  DATE OF DISCHARGE:                                HISTORY & PHYSICAL   CHIEF COMPLAINT:  Leg pain.   HISTORY OF PRESENT ILLNESS:  [name] is a 55 year old white male with  chronic venous stasis and recurrent cellulitis of the legs as well as DVTs  who was directly admitted from the [facility] with cellulitis of  his left lower extremity. He woke up with severe pain, warmth, redness, and  swelling of his leg and this was similar to previous episodes of cellulitis.  He went to the walk-in clinic and was directly admitted to the hospital. He  has been having chills. No fevers. He has felt nauseated. He has had no  recent long trips.   PAST MEDICAL HISTORY:  1.  Recurrent cellulitis of the legs.  2.  Venous stasis dermatitis.  3.  Chronic peripheral edema.  4.  Peripheral neuropathy.  5.  Hypertension.  6.  Degenerative disc disease.   MEDICATIONS:  1.  Altace 10 mg p.o. b.i.d.  2.  Toprol XL 100 mg daily.  3.  Neurontin 1200 mg in the morning, in the afternoon, and 900 mg at night.  4.  Celebrex 200 mg p.o. b.i.d.  5.  Ambien 10 mg p.o. q.h.s. p.r.n.  6.  Elavil 25 mg p.o. q.h.s.  7.  Tramadol p.r.n.  8.  Lasix 40 mg daily.  9.  Aspirin 325 mg p.o. b.i.d.   ALLERGIES:  None.   SOCIAL HISTORY:  Reviewed and as per previous H&P's.   FAMILY HISTORY:  Reviewed and as per previous H&P's.   REVIEW OF SYMPTOMS:  As above. Otherwise negative.   PHYSICAL EXAMINATION:  VITAL SIGNS:  Not entered into the computer yet.  GENERAL:  The patient is an obese white male in no acute distress.  HEENT:  Normocephalic and atraumatic. Pupils are equal, round, and reactive  to light. Moist mucus membranes.  NECK:  Supple.  LUNGS:  Clear to auscultation bilaterally without wheezes, rhonchi, or  rales.  CARDIOVASCULAR:  Regular rate and rhythm without murmurs, gallops, or rubs.  ABDOMEN:  Obese, soft, and nontender.  GENITOURINARY:  Deferred.  EXTREMITIES:  He has circumferential erythema, induration, and warmth from  the ankle proximally to the mid-calf and then somewhat less erythematous  area to the knee circumferentially.  NEUROLOGICAL:  The patient is alert and oriented. Cranial nerves and sensory  motor exam are grossly intact.   LABORATORY DATA:  Pending.   ASSESSMENT/PLAN:  1.  Cellulitis, recurrent of the left lower extremity:  I will give      intravenous vancomycin as he has had deep vein thrombosis in the past. I      will check a Doppler to rule out deep vein thrombosis. His legs should  remain elevated.  2.  Chronic venous stasis.  3.  Hypertension.  4.  Peripheral neuropathy.  5.  Obesity.   DISPOSITION:  I will continue his outpatient medications and give DVT  prophylaxis.       CLS/MEDQ  D:  04/03/2005  T:  04/03/2005  Job:  [id number]   cc:   Vikki Ports, M.D.  [address]  [place]  [place] [address]  Fax: [phone]

## 2011-03-05 NOTE — Discharge Summary (Signed)
[name], [name]                              ACCOUNT NO.:  [id number]   MEDICAL RECORD NO.:  [id number]                   PATIENT TYPE:  INP   LOCATION:  5019                                 FACILITY:  [facility]   PHYSICIAN:  Isla Pence, M.D.             DATE OF BIRTH:  03-23-1956   DATE OF ADMISSION:  12/26/2003  DATE OF DISCHARGE:  01/01/2004                                 DISCHARGE SUMMARY   DISCHARGE DIAGNOSES:  1. Left lower extremity cellulitis with chronic venostasis changes     contributing towards his cellulitis.  2. 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  3. History of deep venous thrombosis in both his legs in early 2004     contributing some towards his chronic venostasis.  4. History of hypertension.  5. History of lumbar disk disease status post surgery to the back with     chronic pain in his back.  6. He was admitted in February 2002 with chest pain and underwent Cardiolite     and cardiac cath by Dr. Fraser Din which was essentially negative for     coronary artery disease.  He had an ejection fraction of 70%.  7. History of insomnia.  8. History of heme-positive stool with colonoscopies 4 different times in     [place] by Dr. Kathe Becton.  All of these apparently have been negative.     Please refer to the initial H&P for further details.  9. He also has a history of similar left lower extremity cellulitis with     positive blood cultures for __________  TA in August 2003.   DISCHARGE MEDICATIONS:  1. Triamcinolone cream 0.1%, apply thin smear to the stasis dermatitis areas     on his legs twice a day for the most 1 week at a time.  The patient has     been advised against chronic use of this since it is a steroid and can     leave long-term sequela just like taking oral steroids.  2. Ceftin 500 mg p.o. b.i.d. for another 7 days.  3. Lasix 20 mg p.o. q.d.  This is to replace his hydrochlorothiazide in the     hopes that we can try  and get slightly more fluid off of his legs.  4. Toprol XL 100 mg p.o. q.d.  5. Altace 15 mg p.o. b.i.d.  This is an increase from his home dose.  6. Neurontin as he has been taking at home.  7. Elavil as he has been taking at home.  8. Ambien as he has been taking at home.  He is to stop HCTZ and Norvasc.   DISCHARGE ACTIVITY:  The patient has been advised to minimize extensive  ambulation.  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   DISCHARGE DIET:  Low salt.   FOLLOWUP:  The patient has been advised to follow up with Dr. Theresia Lo in 1  week.  I would recommend a BMP in 1 week since he has been put on Lasix and  with this increased dose in the Altace.   HOSPITAL COURSE:  This patient was admitted on March 10 with swelling and  redness in the left leg x 1 day prior to admission.  Because of previous  history and concern for worsening condition, he brought himself in to the  emergency room late that night.  His initial examination showed certainly  the edema in both his legs though left was more than the right.  His initial  white count showed a white count of 6.3, H&H of 16.2 and 46.9, platelet  count of 222,000.  He had a normal differential.  The patient was afebrile.  He was brought in to the hospital, placed on IV antibiotics, initially was  placed on ceftriaxone and subsequently switched to Zosyn by Dr. Ladona Ridgel who  as taking care of him with concerns that maybe ceftriaxone was not quite  doing the job.  Three days prior to discharge, there was some concern that  maybe there was slight worsening of the cellulitis, but it sounds like there  was just condensing more to localized area.  Patient, therefore, was  switched to Zosyn at that time.  His blood cultures have remained negative,  and this was verified by calling the micro lab since today will be the  final  for the blood culture from the 10th and 11th.  The patient did have Doppler  of his legs to rule out a DVT, and that was negative for any DVTs.   Because of the chronic venostasis dermatitis contributing towards his  cellulitis, the patient was started on triamcinolone cream which does a very  nice job with the dermatitis related to this situation, and that remedy has  helped with the crackly skin.  The patient has been advised not to use this  chronically every day because of the nature of the medication.   Also, because of his lower extremity edema, his Norvasc was discontinued.  He was taking 10 mg q.d.  This was discontinued and for his hypertension,  his Altace was increased to 15 mg p.o. b.i.d.  Hopefully, all of these will  help with his lower extremity edema.  In addition, I switched him from  hydrochlorothiazide to Lasix hoping that maybe we might get a little bit  more of the edema in his legs removed which will certainly help with the  venostasis changes.  Would recommend that BMP be done at the time of visit  with Dr. Theresia Lo since the Lasix was just started yesterday, and he had  received just 1 dose yesterday.  I had ordered a BMP for this morning,  however, they never did get it, and I do not feel the need for his discharge  to be held back because of that.   In terms of his latest blood work, he had a white count done on March 13 and  that has remained normal at 10.1 thousand, H&H of 15.9 and 47 on the  hematocrit, platelet count of 191,000.  As mentioned earlier, his initial  white count was 6.3 thousand and on March 12 he had a slight bump in his  white count to  13.2 which was when the Zosyn change was made.  In terms of  his latest BMP, that too was done on March 13, and that showed a sodium of  135, potassium of 3.8, chloride of 100, CO2 of 27, glucose 108, BUN and  creatinine 11 and 0.9 and calcium of 8.3.  For his chronic pain, the patient is continued on  all of his home  medications.   The patient is being discharged to home in stable condition.                                                Isla Pence, M.D.    RRV/MEDQ  D:  01/01/2004  T:  01/03/2004  Job:  [id number]   cc:   Vikki Ports, M.D.  [address]  [place]  [place] [address]  Fax: [phone]
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
## 2011-03-05 NOTE — Discharge Summary (Signed)
[name], [name]                  ACCOUNT NO.:  [id number]   MEDICAL RECORD NO.:  [id number]          PATIENT TYPE:  INP   LOCATION:  5710                         FACILITY:  [facility]   PHYSICIAN:  Hollice Espy, M.D.DATE OF BIRTH:  05/11/1956   DATE OF ADMISSION:  04/03/2005  DATE OF DISCHARGE:  04/06/2005                                 DISCHARGE SUMMARY   DISCHARGE DIAGNOSES:  1.  Cellulitis of the left lower extremity.  2.  Episode of hypotension.  3.  Hypertension.  4.  Obesity.  5.  History of peripheral neuropathy.  6.  Diabetes.   DISCHARGE MEDICATIONS:  1.  Keflex 500 mg one p.o. q.6h.  This will be continued for one more week      following discharge.  2.  Patient will continue all his previous medications.  Lasix 40 p.o.      daily.  3.  Neurontin 1200 mg at 8 p.m. and 2 p.m. and then 900 mg at 9 p.m.  4.  Celebrex 200 mg one p.o. b.i.d.  5.  Altace 10 mg p.o. b.i.d.  6.  Aspirin 325 p.o. b.i.d.  7.  Toprol XL 100 mg one p.o. daily.  8.  Elavil 25 p.o. daily.  9.  Ultram 100 mg q.4h. p.r.n.  10. Ambien 10 q.h.s. p.r.n.   HOSPITAL COURSE:  Patient is a 55 year old white male.  Past medical history  of hypertension, diabetes, venous stasis ulcers, and previous DVT who  presented with increased swelling of his leg and fever.  Was found to have a  cellulitis of his leg.  Initially he was started on broad-spectrum  antibiotics, vancomycin, and Zosyn which he responded to well.  He was  admitted use of IV antibiotics as he had failed previous outpatient therapy.  By June 19 the area of cellulitis by previous markings had decreased in size  and coverage of the leg.  Initially he continued his medications.  However,  his blood pressure dropped to 108/61 on June 18.  Decision was made to hold  his antihypertensives until following discharge.  By April 06, 2005 the  patient was feeling much better.  His leg while swollen was clinically  clearly much better.  Cellulitis  continued to improve.  He remained  afebrile.  He was changed over to p.o. antibiotics and sent home.  Plan will  be for him to follow up with his PCP, Dr. Theresia Lo.  He is limited to no  heavy exertion or exercise until the cellulitis is fully resolved.   DISCHARGE DIET:  Low sodium, carbohydrate modified diet.   DISPOSITION:  Overall is improved.  He is being discharged to home.      Hollice Espy, M.D.  Electronically Signed     SKK/MEDQ  D:  06/23/2005  T:  06/24/2005  Job:  [id number]   cc:   Vikki Ports, M.D.  [address]  [place]  [place] [address]  Fax: [phone]
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
## 2011-03-05 NOTE — Op Note (Signed)
[facility]  Patient:    [name], Warren Visit Number: [id number] MRN: [id number]          Service Type: DSU Location: [facility] Attending Physician:  Milly Jakob Dictated by:   Harvie Junior, M.D. Proc. Date: 09/22/01 Admit Date:  09/22/2001                             Operative Report  PREOPERATIVE DIAGNOSIS:  Carpal tunnel syndrome, right.  POSTOPERATIVE DIAGNOSIS:  Carpal tunnel syndrome, right.  OPERATION PERFORMED:  Right carpal tunnel release.  SURGEON:  Harvie Junior, M.D.  ANESTHESIA:  Forearm based IV regional.  INDICATIONS FOR PROCEDURE:  The patient is a 55 year old male with a long history of having multiple significant nerve problems to the back.  He also had some upper extremity nerve problems in the neck and had a ____________ documented right carpal tunnel syndrome.  I also had him worked up by a neurosurgeon who felt that the neck would not be an issue with his carpal tunnel and ultimately felt that a carpal tunnel release would be most appropriate and he came back in for evaluation of this.  DESCRIPTION OF PROCEDURE:  The patient was brought to the operating room and after adequate anesthesia was obtained with a forearm based IV regional, the patient was placed supine on the operating table.  The right arm was prepped and draped in the usual sterile fashion.  Following this, a curvilinear incision was made just ulnar to the midline wrist crease.  Subcutaneous tissues were dissected down to the level of the volar carpal ligament which was clearly identified and divided in its midportion.  Care was taken with a Freer elevator to make sure the nerve was not adherent to the undersurface and the nerve was divided both proximally and distally with a gloved finger put in the wound proximally and distally.  The nerve was then identified and was noted to be compressed significantly just under the carpal ligament.  The motor branch  of the median nerve was identified and noted to be clear.  At this point the wound was copiously irrigated and suctioned dry.  The skin was then closed with a combination of 4-0 interrupted and running sutures.  A sterile compressive dressing was applied as well as volar plaster.  The patient was taken to the recovery room where she was noted to be in satisfactory condition.  Estimated blood loss for this procedure was none. Dictated by:   Harvie Junior, M.D. Attending Physician:  Milly Jakob DD:  09/22/01 TD:  09/22/01 Job: [id number]

## 2011-03-05 NOTE — Discharge Summary (Signed)
[name], [name]                              ACCOUNT NO.:  [id number]   MEDICAL RECORD NO.:  [id number]                   PATIENT TYPE:  INP   LOCATION:  5004                                 FACILITY:  [facility]   PHYSICIAN:  Jackie Plum, M.D.             DATE OF BIRTH:  Apr 21, 1956   DATE OF ADMISSION:  06/06/2002  DATE OF DISCHARGE:  06/11/2002                                 DISCHARGE SUMMARY   DISCHARGE DIAGNOSES:  1. Acute left lower extremity cellulitis -- improved.  2. Sepsis secondary to diagnosis #1 -- improved.     a. Blood culture positive for Streptococcus agalactiae in two bottles.  3. Morbid obesity.  4. Hypertension.  5. History of lumbar disk disease.  6. History of chronic pain.   DISCHARGE MEDICATIONS:  The patient is going to continue all his pre-  admission medications as he was taking prior to admission; these medications  include:  1. Neurontin 300 mg t.i.d.  2. Altace 10 mg b.i.d.  3. Celebrex 200 mg b.i.d.  4. Elavil 25 mg q.h.s.  5. Ambien 10 mg q.h.s.  6. Ultram 50 mg q.i.d.  7. Aspirin 325 mg q.d.   In addition, the patient is going to continue these two new medications  which are:  1. Percocet one to two tablets every four hours p.r.n.  2. Ceftin 500 mg p.o. b.i.d. to make a total of 14 days of antibiotic     treatment.   DISCHARGE LABORATORY DATA:  WBC count 8.6, hemoglobin 14.4, hematocrit  __________ , platelet count 219,000, MCV 94.4.  Sodium 140, potassium 3.8,  chloride 104, CO2 30, glucose 111, BUN 9, creatinine 0.9, calcium 8.2.   ACTIVITY:  Activities as tolerated.   DIET:  Low-salt diet.   SPECIAL INSTRUCTIONS:  The patient is to keep his affected leg elevated  while sleeping or sitting.   FOLLOWUP:  Follow up in one to two weeks with his primary care physician,  Dr. Vikki Ports, and Dr. Roxan Hockey; the patient is to call for an  appointment.   CONSULTANTS:  Dr. Rockey Situ. Roxan Hockey and Dr. Ginnie Smart of  infectious  disease.   PROCEDURES:  Not applicable.   CONDITION ON DISCHARGE:  Improved.   REASON FOR HOSPITALIZATION:  Acute cellulitis and sepsis.   HOSPITAL COURSE:  1. SEPSIS:  The patient presented with a temperature of 105 degrees     Fahrenheit.  He was tachycardic with admitting pulse rate of 120 per     minute.  His physical exam was unremarkable except for left lower     extremity erythema and swelling with warmth to touch on skin with     cellulitis.  He had leukocytosis of 20,800 on admission.  He was     initiated on IV antibiotics, initially Ancef, which was subsequently     changed to Rocephin on growing Strep group  B in his blood cultures in two     bottles.  With antibiotic treatment, the patient's temperature came down     and he was afebrile 24 hours prior to discharge.  His leukocytosis     resolved, with his discharge leukocyte count of 8600.  His constitutional     symptomatology also improved and the patient is going home to continue     with p.o. antibiotics as mentioned above as outpatient.   1. CELLULITIS:  The patient was continued on antibiotics as mentioned above.     Ultrasound was obtained which ruled out any evidence of DVT.  MRI was     obtained which was negative for any abscess.  There was questionable     osteomyelitis on the patient's lower extremity x-ray which was negated by     the MRI study.  The patient is going home to continue lower extremity     elevations as mentioned above with p.o. antibiotics.   1. UNCONTROLLED HYPERTENSION:    The patient's blood pressure was     uncontrolled during hospitalization.  His discharge BP was 154/98; I     therefore have added hydrochlorothiazide 25 mg p.o. q.d. to the patient's     medication regimen for optimization of hypertension control.   CONDITION ON DISCHARGE:  Improved.                                               Jackie Plum, M.D.    GO/MEDQ  D:  06/11/2002  T:  06/13/2002  Job:   [id number]   cc:   Vikki Ports, M.D.   Ginnie Smart, M.D.

## 2011-03-05 NOTE — Cardiovascular Report (Signed)
[place]  Patient:    [name], [name]                           MRN: [id number] Proc. Date: 12/08/00 Adm. Date:  12/07/00 Attending:  Meade Maw, M.D. CC:         Vikki Ports, M.D.   Cardiac Catheterization  INDICATIONS:  Persistent chest pain and anxiety component.  PROCEDURES PERFORMED: 1. Selective coronary angiography. 2. Left ventriculography.  REFERRING PHYSICIAN:  Vikki Ports, M.D.  DESCRIPTION OF PROCEDURE:  After obtaining written informed consent, the patient was brought to the cardiac catheterization lab in the postabsorptive state. Preoperative sedation was achieved using IV Versed and IV fentanyl. The right groin was prepped and draped using standard fashion. Local anesthesia was achieved using 1% Xylocaine. A #6 French hemostasis sheath was placed into the right femoral artery using modified Seldinger technique. Selective coronary angiography was performed using a JL-4 and JR-4 Judkins catheter. Multiple views were obtained. Single plane ventriculogram was performed in the RAO position using a #6 French pigtail curved catheter. All catheter exchanges were made over a guidewire. Following the procedure, the patient was transferred to the holding area. Hemostasis was achieved using digital pressure.  FINDINGS:  CORONARY ANGIOGRAPHY: 1. Left main coronary artery:  The left main coronary artery bifurcated into    the left anterior descending artery and circumflex vessels. There was no    significant disease in the left main coronary artery. 2. Left anterior descending artery:  The left anterior descending artery gave    rise to a small diagonal one and moderate diagonal two and went on to end    as an apical recurrent branch. There was no disease in the left anterior    descending artery nor its branches. 3. Circumflex coronary artery:  The circumflex vessel was a large codominant    vessel that gave rise to a  moderate sized obtuse marginal one and    large obtuse marginal two, moderate obtuse marginal three, moderate    obtuse marginal four and a posterior descending vessel. There was no    disease in the circumflex or its branches. 4. Right coronary artery:  The right coronary artery was a large dominant    artery that gave rise to a small RV marginal one, small RV marginal    two, a small RV marginal three and a posterior descending branch. There    was no disease in the right coronary artery.  LEFT VENTRICULOGRAM: 1. Single plane ventriculogram revealed normal wall motion. 2. There was no mitral regurgitation noted. 3. Ejection fraction of approximately 70%.  IMPRESSION: 1. Normal coronary arteries. 2. Normal ventriculogram.  RECOMMENDATIONS:  To consider other etiologies for his chest pain. DD:  12/08/00 TD:  12/09/00 Job: [id number]

## 2011-07-07 ENCOUNTER — Other Ambulatory Visit: Payer: Self-pay | Admitting: Dermatology

## 2012-01-26 ENCOUNTER — Other Ambulatory Visit: Payer: Self-pay | Admitting: Dermatology

## 2012-02-24 ENCOUNTER — Ambulatory Visit
Admission: RE | Admit: 2012-02-24 | Discharge: 2012-02-24 | Disposition: A | Discharge status: Home / Self Care | Payer: Medicare Other | Source: Ambulatory Visit | Attending: Neurology | Admitting: Neurology

## 2012-02-24 ENCOUNTER — Other Ambulatory Visit: Payer: Self-pay | Admitting: Neurology

## 2012-02-24 DIAGNOSIS — R269 Unspecified abnormalities of gait and mobility: Secondary | ICD-10-CM

## 2012-03-09 ENCOUNTER — Other Ambulatory Visit: Payer: Self-pay | Admitting: Neurology

## 2012-03-09 DIAGNOSIS — R269 Unspecified abnormalities of gait and mobility: Secondary | ICD-10-CM

## 2012-03-09 DIAGNOSIS — M545 Low back pain: Secondary | ICD-10-CM

## 2012-03-09 DIAGNOSIS — G4733 Obstructive sleep apnea (adult) (pediatric): Secondary | ICD-10-CM

## 2012-03-09 DIAGNOSIS — R51 Headache: Secondary | ICD-10-CM

## 2012-03-09 DIAGNOSIS — G63 Polyneuropathy in diseases classified elsewhere: Secondary | ICD-10-CM

## 2012-03-25 ENCOUNTER — Ambulatory Visit
Admission: RE | Admit: 2012-03-25 | Discharge: 2012-03-25 | Disposition: A | Discharge status: Home / Self Care | Payer: Medicare Other | Source: Ambulatory Visit | Attending: Neurology | Admitting: Neurology

## 2012-03-25 DIAGNOSIS — G4733 Obstructive sleep apnea (adult) (pediatric): Secondary | ICD-10-CM

## 2012-03-25 DIAGNOSIS — R269 Unspecified abnormalities of gait and mobility: Secondary | ICD-10-CM

## 2012-03-25 DIAGNOSIS — G63 Polyneuropathy in diseases classified elsewhere: Secondary | ICD-10-CM

## 2012-03-25 DIAGNOSIS — M545 Low back pain: Secondary | ICD-10-CM

## 2012-03-25 DIAGNOSIS — R51 Headache: Secondary | ICD-10-CM

## 2012-03-25 MED ORDER — GADOBENATE DIMEGLUMINE 529 MG/ML IV SOLN
20.0000 mL | Freq: Once | INTRAVENOUS | Status: AC | PRN
  Administered 2012-03-25: 20 mL via INTRAVENOUS

## 2012-03-29 ENCOUNTER — Other Ambulatory Visit: Payer: Self-pay | Admitting: Neurology

## 2012-03-29 DIAGNOSIS — M545 Low back pain: Secondary | ICD-10-CM

## 2012-04-03 ENCOUNTER — Ambulatory Visit
Admission: RE | Admit: 2012-04-03 | Discharge: 2012-04-03 | Disposition: A | Discharge status: Home / Self Care | Payer: Medicare Other | Source: Ambulatory Visit | Attending: Neurology | Admitting: Neurology

## 2012-04-03 VITALS — BP 120/53 | HR 82

## 2012-04-03 DIAGNOSIS — M545 Low back pain: Secondary | ICD-10-CM

## 2012-04-03 MED ORDER — METHYLPREDNISOLONE ACETATE 40 MG/ML INJ SUSP (RADIOLOG
120.0000 mg | Freq: Once | INTRAMUSCULAR | Status: AC
  Administered 2012-04-03: 120 mg via EPIDURAL

## 2012-04-03 MED ORDER — IOHEXOL 180 MG/ML  SOLN
1.0000 mL | Freq: Once | INTRAMUSCULAR | Status: AC | PRN
  Administered 2012-04-03: 1 mL via EPIDURAL

## 2012-04-03 NOTE — Discharge Instructions (Signed)

## 2012-11-22 DIAGNOSIS — M542 Cervicalgia: Secondary | ICD-10-CM | POA: Insufficient documentation

## 2012-11-22 DIAGNOSIS — R51 Headache: Secondary | ICD-10-CM | POA: Insufficient documentation

## 2012-11-22 DIAGNOSIS — R269 Unspecified abnormalities of gait and mobility: Secondary | ICD-10-CM | POA: Insufficient documentation

## 2012-11-22 DIAGNOSIS — G911 Obstructive hydrocephalus: Secondary | ICD-10-CM | POA: Insufficient documentation

## 2012-11-22 DIAGNOSIS — R519 Headache, unspecified: Secondary | ICD-10-CM | POA: Insufficient documentation

## 2012-11-22 DIAGNOSIS — IMO0002 Reserved for concepts with insufficient information to code with codable children: Secondary | ICD-10-CM | POA: Insufficient documentation

## 2012-11-22 DIAGNOSIS — M4802 Spinal stenosis, cervical region: Secondary | ICD-10-CM | POA: Insufficient documentation

## 2012-11-22 DIAGNOSIS — M79609 Pain in unspecified limb: Secondary | ICD-10-CM | POA: Insufficient documentation

## 2012-11-22 DIAGNOSIS — G63 Polyneuropathy in diseases classified elsewhere: Secondary | ICD-10-CM | POA: Insufficient documentation

## 2012-11-22 DIAGNOSIS — G4733 Obstructive sleep apnea (adult) (pediatric): Secondary | ICD-10-CM | POA: Insufficient documentation

## 2012-11-22 DIAGNOSIS — M545 Low back pain: Secondary | ICD-10-CM | POA: Insufficient documentation

## 2012-11-22 DIAGNOSIS — M5030 Other cervical disc degeneration, unspecified cervical region: Secondary | ICD-10-CM | POA: Insufficient documentation

## 2012-12-07 ENCOUNTER — Ambulatory Visit: Payer: Medicare Other | Attending: Neurology | Admitting: Rehabilitative and Restorative Service Providers"

## 2012-12-07 DIAGNOSIS — IMO0001 Reserved for inherently not codable concepts without codable children: Secondary | ICD-10-CM | POA: Insufficient documentation

## 2012-12-07 DIAGNOSIS — M545 Low back pain, unspecified: Secondary | ICD-10-CM | POA: Insufficient documentation

## 2012-12-07 DIAGNOSIS — M542 Cervicalgia: Secondary | ICD-10-CM | POA: Insufficient documentation

## 2012-12-07 DIAGNOSIS — G609 Hereditary and idiopathic neuropathy, unspecified: Secondary | ICD-10-CM | POA: Insufficient documentation

## 2012-12-08 ENCOUNTER — Ambulatory Visit: Payer: Medicare Other | Admitting: Rehabilitative and Restorative Service Providers"

## 2012-12-11 ENCOUNTER — Ambulatory Visit: Payer: Medicare Other | Admitting: Rehabilitative and Restorative Service Providers"

## 2012-12-14 ENCOUNTER — Other Ambulatory Visit: Payer: Self-pay | Admitting: Neurology

## 2012-12-14 DIAGNOSIS — M545 Low back pain: Secondary | ICD-10-CM

## 2012-12-15 ENCOUNTER — Ambulatory Visit: Payer: Medicare Other | Admitting: Rehabilitative and Restorative Service Providers"

## 2012-12-19 ENCOUNTER — Ambulatory Visit: Payer: Medicare Other | Admitting: Rehabilitative and Restorative Service Providers"

## 2012-12-21 ENCOUNTER — Ambulatory Visit
Admission: RE | Admit: 2012-12-21 | Discharge: 2012-12-21 | Disposition: A | Discharge status: Home / Self Care | Payer: Medicare Other | Source: Ambulatory Visit | Attending: Neurology | Admitting: Neurology

## 2012-12-21 ENCOUNTER — Ambulatory Visit: Payer: Medicare Other | Admitting: Rehabilitative and Restorative Service Providers"

## 2012-12-21 VITALS — BP 191/85 | HR 80

## 2012-12-21 DIAGNOSIS — M545 Low back pain: Secondary | ICD-10-CM

## 2012-12-21 MED ORDER — METHYLPREDNISOLONE ACETATE 40 MG/ML INJ SUSP (RADIOLOG
120.0000 mg | Freq: Once | INTRAMUSCULAR | Status: AC
  Administered 2012-12-21: 120 mg via EPIDURAL

## 2012-12-21 MED ORDER — IOHEXOL 180 MG/ML  SOLN
1.0000 mL | Freq: Once | INTRAMUSCULAR | Status: AC | PRN
  Administered 2012-12-21: 1 mL via EPIDURAL

## 2012-12-26 ENCOUNTER — Ambulatory Visit: Payer: Medicare Other | Admitting: Rehabilitative and Restorative Service Providers"

## 2012-12-27 ENCOUNTER — Other Ambulatory Visit: Payer: Self-pay

## 2012-12-29 ENCOUNTER — Ambulatory Visit: Payer: Medicare Other | Attending: Neurology | Admitting: Rehabilitative and Restorative Service Providers"

## 2012-12-29 DIAGNOSIS — M542 Cervicalgia: Secondary | ICD-10-CM | POA: Insufficient documentation

## 2012-12-29 DIAGNOSIS — M545 Low back pain, unspecified: Secondary | ICD-10-CM | POA: Insufficient documentation

## 2012-12-29 DIAGNOSIS — IMO0001 Reserved for inherently not codable concepts without codable children: Secondary | ICD-10-CM | POA: Insufficient documentation

## 2012-12-29 DIAGNOSIS — G609 Hereditary and idiopathic neuropathy, unspecified: Secondary | ICD-10-CM | POA: Insufficient documentation

## 2013-01-01 ENCOUNTER — Ambulatory Visit: Payer: Medicare Other | Admitting: Rehabilitative and Restorative Service Providers"

## 2013-01-04 ENCOUNTER — Ambulatory Visit: Payer: Medicare Other | Admitting: Rehabilitative and Restorative Service Providers"

## 2013-01-05 ENCOUNTER — Ambulatory Visit: Payer: Medicare Other | Admitting: Rehabilitative and Restorative Service Providers"

## 2013-01-10 ENCOUNTER — Ambulatory Visit: Payer: Medicare Other | Admitting: Rehabilitative and Restorative Service Providers"

## 2013-01-12 ENCOUNTER — Ambulatory Visit: Payer: Medicare Other | Admitting: Rehabilitative and Restorative Service Providers"

## 2013-01-22 ENCOUNTER — Ambulatory Visit: Payer: Medicare Other | Attending: Neurology | Admitting: Rehabilitative and Restorative Service Providers"

## 2013-01-22 DIAGNOSIS — M542 Cervicalgia: Secondary | ICD-10-CM | POA: Insufficient documentation

## 2013-01-22 DIAGNOSIS — G609 Hereditary and idiopathic neuropathy, unspecified: Secondary | ICD-10-CM | POA: Insufficient documentation

## 2013-01-22 DIAGNOSIS — M545 Low back pain, unspecified: Secondary | ICD-10-CM | POA: Insufficient documentation

## 2013-01-22 DIAGNOSIS — IMO0001 Reserved for inherently not codable concepts without codable children: Secondary | ICD-10-CM | POA: Insufficient documentation

## 2013-01-24 ENCOUNTER — Other Ambulatory Visit: Payer: Self-pay | Admitting: Neurology

## 2013-03-29 ENCOUNTER — Encounter: Payer: Self-pay | Admitting: Neurology

## 2013-03-29 DIAGNOSIS — G911 Obstructive hydrocephalus: Secondary | ICD-10-CM

## 2013-03-29 DIAGNOSIS — M545 Low back pain: Secondary | ICD-10-CM

## 2013-03-29 DIAGNOSIS — R269 Unspecified abnormalities of gait and mobility: Secondary | ICD-10-CM

## 2013-03-29 DIAGNOSIS — IMO0002 Reserved for concepts with insufficient information to code with codable children: Secondary | ICD-10-CM

## 2013-03-29 DIAGNOSIS — M5030 Other cervical disc degeneration, unspecified cervical region: Secondary | ICD-10-CM

## 2013-03-29 DIAGNOSIS — G63 Polyneuropathy in diseases classified elsewhere: Secondary | ICD-10-CM

## 2013-03-29 DIAGNOSIS — M79609 Pain in unspecified limb: Secondary | ICD-10-CM

## 2013-03-29 DIAGNOSIS — M4802 Spinal stenosis, cervical region: Secondary | ICD-10-CM

## 2013-03-29 DIAGNOSIS — R51 Headache: Secondary | ICD-10-CM

## 2013-03-29 DIAGNOSIS — G4733 Obstructive sleep apnea (adult) (pediatric): Secondary | ICD-10-CM

## 2013-03-29 DIAGNOSIS — M542 Cervicalgia: Secondary | ICD-10-CM

## 2013-03-30 ENCOUNTER — Ambulatory Visit (INDEPENDENT_AMBULATORY_CARE_PROVIDER_SITE_OTHER): Payer: Medicare Other | Admitting: Neurology

## 2013-03-30 ENCOUNTER — Encounter: Payer: Self-pay | Admitting: Neurology

## 2013-03-30 VITALS — BP 152/85 | HR 95 | Ht 71.0 in | Wt 340.0 lb

## 2013-03-30 DIAGNOSIS — M545 Low back pain, unspecified: Secondary | ICD-10-CM

## 2013-03-30 DIAGNOSIS — G63 Polyneuropathy in diseases classified elsewhere: Secondary | ICD-10-CM

## 2013-03-30 DIAGNOSIS — R269 Unspecified abnormalities of gait and mobility: Secondary | ICD-10-CM

## 2013-03-30 MED ORDER — HYDROCODONE-ACETAMINOPHEN 5-325 MG PO TABS: 1.0000 | ORAL_TABLET | Freq: Four times a day (QID) | ORAL | Status: DC | PRN

## 2013-03-30 NOTE — Progress Notes (Signed)
Reason for visit: Gait disorder  [name] is an 57 y.o. male  History of present illness:  [name] is a 57 year old right-handed white male with a history of obesity, peripheral edema, and obstructive hydrocephalus, compensated, and a history of cervical and lumbosacral spine discomfort. The patient has undergone an epidural steroid injection on the low back, and he indicates that this has helped his low back pain immensely. The patient has had one fall since last seen when he was going downstairs and miscalculated the steps. The patient is using a cane for ambulation. The patient denies any problems controlling the bowels or the bladder. In the past, the patient has seen Dr. Jeral Fruit for the hydrocephalus, and a VP shunt was not recommended. The patient returns for an evaluation. The patient has a peripheral neuropathy by nerve conduction study.  Past Medical History  Diagnosis Date  . Peripheral neuropathy   . Obesity   . Obstructive sleep apnea on CPAP   . Hydrocephalus   . History of headache   . Peripheral edema     Chronic venous stasis  . Gait disorder   . Hypertension   . History of melanoma   . Chronic insomnia   . Chronic low back pain   . Pancreatitis     Secondary to gallstones  . Pancreatitis due to biliary obstruction     Past Surgical History  Procedure Laterality Date  . Lumbar spine surgery    . Carpal tunnel release Bilateral   . Inguinal hernia repair    . Cholecystectomy    . Tonsillectomy      Family History  Problem Relation Age of Onset  . Cancer Mother     Liver cancer  . Cancer Father     brain cancer  . Cancer Maternal Grandmother     Liver cancer  . Heart attack Maternal Grandfather     Social history:  reports that he has never smoked. He does not have any smokeless tobacco history on file. He reports that  drinks alcohol. He reports that he does not use illicit drugs.  Allergies: No Known Allergies  Medications:  Current  Outpatient Prescriptions on File Prior to Visit  Medication Sig Dispense Refill  . aspirin 325 MG tablet Take 325 mg by mouth daily.      . cephALEXin (KEFLEX) 500 MG capsule Take 500 mg by mouth every 6 (six) hours as needed.      . diclofenac (VOLTAREN) 75 MG EC tablet Take 75 mg by mouth 2 (two) times daily.      . furosemide (LASIX) 40 MG tablet Take 40 mg by mouth 2 (two) times daily.      Marland Kitchen gabapentin (NEURONTIN) 300 MG capsule Take 300 mg by mouth 4 (four) times daily.      . nortriptyline (PAMELOR) 10 MG capsule Take 30 mg by mouth at bedtime.       . ramipril (ALTACE) 10 MG capsule Take 10 mg by mouth 2 (two) times daily.      Marland Kitchen tiZANidine (ZANAFLEX) 4 MG capsule Take 4 mg by mouth 3 (three) times daily.      Marland Kitchen triamcinolone cream (KENALOG) 0.1 % Apply 1 application topically 2 (two) times daily.      Marland Kitchen zolpidem (AMBIEN) 10 MG tablet Take 10 mg by mouth at bedtime as needed for sleep.       No current facility-administered medications on file prior to visit.    ROS:  Out  of a complete 14 system review of symptoms, the patient complains only of the following symptoms, and all other reviewed systems are negative.  Swelling the legs Ringing in the ears Incontinence Constipation Impotence  Not enough sleep  Restless legs   Blood pressure 152/85, pulse 95, height 5\' 11"  (1.803 m), weight 340 lb (154.223 kg).  Physical Exam  General: The patient is alert and cooperative at the time of the examination.The patient is markedly obese.  Skin: 4+ woody edema is noted in both legs below the knees.   Neurologic Exam  Cranial nerves: Facial symmetry is present. Speech is normal, no aphasia or dysarthria is noted. Extraocular movements are full. Visual fields are full.  Motor: The patient has good strength in all 4 extremities.  Coordination: The patient has good finger-nose-finger and heel-to-shin bilaterally.  Gait and station: The patient has a slightly wide-based, unsteady  gait. The patient uses a cane for ambulation. Tandem gait was slightly unsteady.  Romberg is negative. No drift is seen.  Reflexes: Deep tendon reflexes are symmetric, but are depressed.   Assessment/Plan:  One. Gait disorder  2. Obstructive hydrocephalus, compensated  3. Peripheral neuropathy  4. Severe peripheral edema  5. Cervical and lumbosacral spondylosis  The patient likely has a multifactorial gait disorder. If the walking deteriorates over time, a repeat MRI of the brain and cervical spine can be done. The patient indicates that he got benefit with physical therapy with his ability to walk. The epidural steroid injection of the low back was very helpful, and this can be repeated if needed. The patient will followup in 6-8 months.  Marlan Palau MD 03/30/2013 5:56 PM  [facility] [address] [place], [place] [address]  Phone [phone] Fax [phone]

## 2013-04-17 ENCOUNTER — Other Ambulatory Visit: Payer: Self-pay | Admitting: Neurology

## 2013-08-09 ENCOUNTER — Other Ambulatory Visit: Payer: Self-pay | Admitting: Dermatology

## 2013-08-13 ENCOUNTER — Telehealth: Payer: Self-pay | Admitting: Neurology

## 2013-08-14 ENCOUNTER — Telehealth: Payer: Self-pay | Admitting: Neurology

## 2013-08-14 ENCOUNTER — Other Ambulatory Visit: Payer: Self-pay

## 2013-08-14 MED ORDER — HYDROCODONE-ACETAMINOPHEN 5-325 MG PO TABS: 1.0000 | ORAL_TABLET | Freq: Four times a day (QID) | ORAL | Status: DC | PRN

## 2013-08-14 NOTE — Telephone Encounter (Signed)
Request sent to provider for approval.  

## 2013-08-14 NOTE — Telephone Encounter (Signed)
Call pt about his medication, Norco. Advised pt that his prescription was ready to be pick up at the office. Pt verbalized understanding.

## 2013-08-30 ENCOUNTER — Other Ambulatory Visit: Payer: Self-pay | Admitting: Neurology

## 2013-09-17 ENCOUNTER — Other Ambulatory Visit: Payer: Self-pay | Admitting: Neurology

## 2013-09-17 ENCOUNTER — Other Ambulatory Visit: Payer: Self-pay

## 2013-09-17 MED ORDER — HYDROCODONE-ACETAMINOPHEN 5-325 MG PO TABS: 1.0000 | ORAL_TABLET | Freq: Four times a day (QID) | ORAL | Status: DC | PRN

## 2013-09-17 NOTE — Telephone Encounter (Signed)
I called patient and let him know Rx will be available at the front desk after 4:30 p.m. For pick up. Patient states he will be here in the morning.

## 2013-09-18 ENCOUNTER — Other Ambulatory Visit: Payer: Self-pay | Admitting: Neurology

## 2013-10-12 ENCOUNTER — Encounter: Payer: Self-pay | Admitting: Neurology

## 2013-10-12 ENCOUNTER — Encounter (INDEPENDENT_AMBULATORY_CARE_PROVIDER_SITE_OTHER): Payer: Self-pay

## 2013-10-12 ENCOUNTER — Ambulatory Visit (INDEPENDENT_AMBULATORY_CARE_PROVIDER_SITE_OTHER): Payer: Medicare Other | Admitting: Neurology

## 2013-10-12 VITALS — BP 144/79 | HR 99 | Ht 72.0 in | Wt 310.0 lb

## 2013-10-12 DIAGNOSIS — M4716 Other spondylosis with myelopathy, lumbar region: Secondary | ICD-10-CM

## 2013-10-12 DIAGNOSIS — R269 Unspecified abnormalities of gait and mobility: Secondary | ICD-10-CM

## 2013-10-12 DIAGNOSIS — G5710 Meralgia paresthetica, unspecified lower limb: Secondary | ICD-10-CM | POA: Insufficient documentation

## 2013-10-12 DIAGNOSIS — M4802 Spinal stenosis, cervical region: Secondary | ICD-10-CM

## 2013-10-12 DIAGNOSIS — G63 Polyneuropathy in diseases classified elsewhere: Secondary | ICD-10-CM

## 2013-10-12 MED ORDER — HYDROCODONE-ACETAMINOPHEN 5-325 MG PO TABS: 1.0000 | ORAL_TABLET | Freq: Four times a day (QID) | ORAL | Status: DC | PRN

## 2013-10-12 NOTE — Progress Notes (Signed)
Reason for visit: Peripheral neuropathy  [name] is an 57 y.o. male  History of present illness:  [name] is a 57 year old right-handed white male with a history of a peripheral neuropathy, significant peripheral edema, obesity, and low back pain. The patient more recently has noted bilateral, left greater than right, anterolateral thigh discomfort and numbness and tingling sensations. The patient in the past has gained a lot of benefit with the back pain with epidural steroid injections. The last injection was done in June of 2013. The patient takes hydrocodone if needed, usually one a day. The patient is also on low-dose nortriptyline, 30 mg at night. The patient takes Voltaren. The patient returns to this office for an evaluation. The patient uses a cane for ambulation, he reports no falls since last seen.  Past Medical History  Diagnosis Date  . Peripheral neuropathy   . Obesity   . Obstructive sleep apnea on CPAP   . Hydrocephalus   . History of headache   . Peripheral edema     Chronic venous stasis  . Gait disorder   . Hypertension   . History of melanoma   . Chronic insomnia   . Chronic low back pain   . Pancreatitis     Secondary to gallstones  . Pancreatitis due to biliary obstruction     Past Surgical History  Procedure Laterality Date  . Lumbar spine surgery    . Carpal tunnel release Bilateral   . Inguinal hernia repair    . Cholecystectomy    . Tonsillectomy      Family History  Problem Relation Age of Onset  . Cancer Mother     Liver cancer  . Cancer Father     brain cancer  . Cancer Maternal Grandmother     Liver cancer  . Heart attack Maternal Grandfather     Social history:  reports that he has never smoked. He does not have any smokeless tobacco history on file. He reports that he drinks alcohol. He reports that he does not use illicit drugs.   No Known Allergies  Medications:  Current Outpatient Prescriptions on File Prior to Visit    Medication Sig Dispense Refill  . cephALEXin (KEFLEX) 500 MG capsule Take 500 mg by mouth every 6 (six) hours as needed.      . diclofenac (VOLTAREN) 75 MG EC tablet TAKE 1 TABLET BY MOUTH TWICE A DAY WITH FOOD  60 tablet  3  . furosemide (LASIX) 40 MG tablet Take 40 mg by mouth 2 (two) times daily.      Marland Kitchen gabapentin (NEURONTIN) 300 MG capsule Take 300 mg by mouth 4 (four) times daily.      . nortriptyline (PAMELOR) 10 MG capsule TAKE 3 CAPSULES (30 MG TOTAL) BY MOUTH EVERY EVENING.  90 capsule  11  . ramipril (ALTACE) 10 MG capsule Take 10 mg by mouth 2 (two) times daily.      Marland Kitchen tiZANidine (ZANAFLEX) 4 MG capsule Take 4 mg by mouth 3 (three) times daily.      Marland Kitchen triamcinolone cream (KENALOG) 0.1 % Apply 1 application topically 2 (two) times daily.      Marland Kitchen zolpidem (AMBIEN) 10 MG tablet Take 10 mg by mouth at bedtime as needed for sleep.       No current facility-administered medications on file prior to visit.    ROS:  Out of a complete 14 system review of symptoms, the patient complains only of the following  symptoms, and all other reviewed systems are negative.  Ringing in the ears Leg swelling Sleep apnea, snoring Joint pain  Blood pressure 144/79, pulse 99, height 6' (1.829 m), weight 310 lb (140.615 kg).  Physical Exam  General: The patient is alert and cooperative at the time of the examination. The patient is markedly obese.  Skin: 3-4+ edema, woody edema, is noted in both lower extremities below the knees.   Neurologic Exam  Mental status: The patient is oriented x 3.  Cranial nerves: Facial symmetry is present. Speech is normal, no aphasia or dysarthria is noted. Extraocular movements are full. Visual fields are full.  Motor: The patient has good strength in all 4 extremities.  Sensory examination: Soft touch sensation in the face and hands is symmetric. The patient has decreased sensation below the knees bilaterally.  Coordination: The patient has good  finger-nose-finger and heel-to-shin bilaterally.  Gait and station: The patient has a wide-based, unsteady gait. The patient uses a cane for ambulation. Tandem gait was not attempted. Romberg is negative. No drift is seen.  Reflexes: Deep tendon reflexes are symmetric, but are depressed.   Assessment/Plan:  One. Lumbosacral spondylosis  2. Peripheral neuropathy  3. Peripheral edema  4. Bilateral meralgia paresthetica, left greater than right  5. Gait disorder  The patient will be sent for an epidural steroid injection. The patient was given a prescription for hydrocodone today. I have recommended weight loss and exercise for the meralgia paresthetica and for the low back pain. The patient will followup through this office in 6 months.  Marlan Palau MD 10/12/2013 6:48 PM  [facility] [address] [place], [place] [address]  Phone [phone] Fax [phone]

## 2013-10-12 NOTE — Patient Instructions (Signed)

## 2013-10-15 ENCOUNTER — Other Ambulatory Visit: Payer: Self-pay | Admitting: Neurology

## 2013-10-15 DIAGNOSIS — M4716 Other spondylosis with myelopathy, lumbar region: Secondary | ICD-10-CM

## 2013-10-16 ENCOUNTER — Ambulatory Visit
Admission: RE | Admit: 2013-10-16 | Discharge: 2013-10-16 | Disposition: A | Discharge status: Home / Self Care | Payer: Medicare Other | Source: Ambulatory Visit | Attending: Neurology | Admitting: Neurology

## 2013-10-16 DIAGNOSIS — M4716 Other spondylosis with myelopathy, lumbar region: Secondary | ICD-10-CM

## 2013-10-16 MED ORDER — METHYLPREDNISOLONE ACETATE 40 MG/ML INJ SUSP (RADIOLOG
120.0000 mg | Freq: Once | INTRAMUSCULAR | Status: AC
  Administered 2013-10-16: 120 mg via EPIDURAL

## 2013-10-16 MED ORDER — IOHEXOL 180 MG/ML  SOLN
1.0000 mL | Freq: Once | INTRAMUSCULAR | Status: AC | PRN
  Administered 2013-10-16: 1 mL via EPIDURAL

## 2013-11-15 ENCOUNTER — Other Ambulatory Visit: Payer: Self-pay | Admitting: Neurology

## 2013-11-15 MED ORDER — HYDROCODONE-ACETAMINOPHEN 5-325 MG PO TABS: 1.0000 | ORAL_TABLET | Freq: Four times a day (QID) | ORAL | Status: DC | PRN

## 2013-11-15 NOTE — Telephone Encounter (Signed)
Pt called again to check the status on his written Rx.  He stated that he only has 2 pills left.  Please call when ready

## 2013-11-15 NOTE — Telephone Encounter (Signed)
Called patient to inform him that his Rx was ready to be picked up at the front desk and if he has any other problems, questions or concerns to call the office. Patient verbalized understanding. °

## 2013-11-15 NOTE — Telephone Encounter (Signed)
PT called again regarding his Rx for hydrocodone.  He asked if written Rx could be ready by 2 pm so that his wife can pick it up.  Please call when ready.  Thank you

## 2013-11-15 NOTE — Telephone Encounter (Signed)
Needs RX for hydrocodone. Would like it be ready today states he needs his wife to be able to pick it up since she works near the office

## 2013-12-21 ENCOUNTER — Other Ambulatory Visit: Payer: Self-pay | Admitting: Neurology

## 2013-12-21 MED ORDER — HYDROCODONE-ACETAMINOPHEN 5-325 MG PO TABS: 1.0000 | ORAL_TABLET | Freq: Four times a day (QID) | ORAL | Status: DC | PRN

## 2013-12-21 NOTE — Telephone Encounter (Signed)
Pt called to get a refill on his HYDROcodone-acetaminophen (NORCO/VICODIN) 5-325 MG per tablet.  Please call once the prescription is ready for pick up.  Thank you

## 2013-12-21 NOTE — Telephone Encounter (Signed)
Left a message that the rx is ready to be picked up at the front desk.

## 2014-02-11 ENCOUNTER — Other Ambulatory Visit: Payer: Self-pay | Admitting: Neurology

## 2014-02-11 MED ORDER — HYDROCODONE-ACETAMINOPHEN 5-325 MG PO TABS: 1.0000 | ORAL_TABLET | Freq: Four times a day (QID) | ORAL | Status: DC | PRN

## 2014-02-11 NOTE — Telephone Encounter (Signed)
Request forwarded to provider for approval  

## 2014-02-11 NOTE — Telephone Encounter (Signed)
Called pt to inform him that his Rx was ready to be picked up at the front desk and if he has any other problems, questions or concerns to call the office. Pt verbalized understanding. °

## 2014-02-11 NOTE — Telephone Encounter (Signed)
Pt called needs written prescription for his HYDROcodone-acetaminophen (NORCO/VICODIN) 5-325 MG per tablet, please call when ready for p/u. Thanks

## 2014-04-12 ENCOUNTER — Encounter: Payer: Self-pay | Admitting: Neurology

## 2014-04-12 ENCOUNTER — Encounter (INDEPENDENT_AMBULATORY_CARE_PROVIDER_SITE_OTHER): Payer: Self-pay

## 2014-04-12 ENCOUNTER — Ambulatory Visit (INDEPENDENT_AMBULATORY_CARE_PROVIDER_SITE_OTHER): Payer: Medicare Other | Admitting: Neurology

## 2014-04-12 VITALS — BP 127/77 | HR 102 | Ht 73.0 in | Wt 342.0 lb

## 2014-04-12 DIAGNOSIS — G63 Polyneuropathy in diseases classified elsewhere: Secondary | ICD-10-CM

## 2014-04-12 DIAGNOSIS — M545 Low back pain, unspecified: Secondary | ICD-10-CM

## 2014-04-12 DIAGNOSIS — R269 Unspecified abnormalities of gait and mobility: Secondary | ICD-10-CM

## 2014-04-12 DIAGNOSIS — G5710 Meralgia paresthetica, unspecified lower limb: Secondary | ICD-10-CM

## 2014-04-12 MED ORDER — HYDROCODONE-ACETAMINOPHEN 5-325 MG PO TABS: 1.0000 | ORAL_TABLET | Freq: Four times a day (QID) | ORAL | Status: DC | PRN

## 2014-04-12 NOTE — Patient Instructions (Signed)

## 2014-04-12 NOTE — Progress Notes (Signed)
Reason for visit: Chronic low back pain  Caroll Rancherdmund L Engelken is an 58 y.o. male  History of present illness:  Mr. [name] is a 58 year old right-handed white male with a history of obesity, chronic low back pain, peripheral neuropathy, lymphedema of the legs, and bilateral meralgia paresthetica. The patient has gotten epidural steroid injections in the past, the last procedure was done in December of 2014. The was able to eliminate the pain in the back almost completely for about one month. The patient is on nortriptyline at night, taking 30 mg. Indicates that he sleeps fairly well. The patient continues to have tingling sensations in the anterolateral thighs bilaterally. He recently has had blisters, on the right lower extremity associated with his lymphedema, and they are currently draining. He returns to this office for an evaluation. He will occasionally take hydrocodone if needed for pain. The patient has some gait instability, he has fallen several times in the last 2 or 3 weeks. He has a cane, but he does not consistently use this.  Past Medical History  Diagnosis Date  . Peripheral neuropathy   . Obesity   . Obstructive sleep apnea on CPAP   . Hydrocephalus   . History of headache   . Peripheral edema     Chronic venous stasis  . Gait disorder   . Hypertension   . History of melanoma   . Chronic insomnia   . Chronic low back pain   . Pancreatitis     Secondary to gallstones  . Pancreatitis due to biliary obstruction     Past Surgical History  Procedure Laterality Date  . Lumbar spine surgery    . Carpal tunnel release Bilateral   . Inguinal hernia repair    . Cholecystectomy    . Tonsillectomy      Family History  Problem Relation Age of Onset  . Cancer Mother     Liver cancer  . Cancer Father     brain cancer  . Cancer Maternal Grandmother     Liver cancer  . Heart attack Maternal Grandfather     Social history:  reports that he has never smoked. He does not have  any smokeless tobacco history on file. He reports that he drinks alcohol. He reports that he does not use illicit drugs.   No Known Allergies  Medications:  Current Outpatient Prescriptions on File Prior to Visit  Medication Sig Dispense Refill  . aspirin 81 MG tablet Take 81 mg by mouth daily.      . cephALEXin (KEFLEX) 500 MG capsule Take 500 mg by mouth every 6 (six) hours as needed.      . diclofenac (VOLTAREN) 75 MG EC tablet TAKE 1 TABLET BY MOUTH TWICE A DAY WITH FOOD  60 tablet  3  . furosemide (LASIX) 40 MG tablet Take 40 mg by mouth 2 (two) times daily.      Marland Kitchen. gabapentin (NEURONTIN) 300 MG capsule Take 300 mg by mouth 4 (four) times daily.      . nortriptyline (PAMELOR) 10 MG capsule TAKE 3 CAPSULES (30 MG TOTAL) BY MOUTH EVERY EVENING.  90 capsule  11  . ramipril (ALTACE) 10 MG capsule Take 10 mg by mouth 2 (two) times daily.      Marland Kitchen. tiZANidine (ZANAFLEX) 4 MG capsule Take 4 mg by mouth 3 (three) times daily.      Marland Kitchen. triamcinolone cream (KENALOG) 0.1 % Apply 1 application topically 2 (two) times daily.  No current facility-administered medications on file prior to visit.    ROS:  Out of a complete 14 system review of symptoms, the patient complains only of the following symptoms, and all other reviewed systems are negative.  Difficulty swallowing Light sensitivity, blurred vision Leg swelling Sleep apnea Low back pain, walking difficulties, neck stiffness Numbness, weakness  Blood pressure 127/77, pulse 102, height 6\' 1"  (1.854 m), weight 342 lb (155.13 kg).  Physical Exam  General: The patient is alert and cooperative at the time of the examination. The patient is markedly obese.  Skin: 3-4+ edema below the knees is noted bilaterally. There are 2 draining skin ulcerations on the right leg.   Neurologic Exam  Mental status: The patient is oriented x 3.  Cranial nerves: Facial symmetry is present. Speech is normal, no aphasia or dysarthria is noted.  Extraocular movements are full. Visual fields are full.  Motor: The patient has good strength in all 4 extremities.  Sensory examination: Soft touch sensation is symmetric on the face, arms, and legs, decreased sensation below the knees bilaterally.  Coordination: The patient has good finger-nose-finger and heel-to-shin bilaterally.  Gait and station: The patient has a slightly wide-based gait. Tandem gait is unsteady. Romberg is negative. No drift is seen.  Reflexes: Deep tendon reflexes are symmetric, but are depressed.   MRI lumbar spine 03/25/2012:  Impression: This MRI scan of the lumbar spine  shows prominent degenerative changes throughout with facet arthropathy  most severe at L 3-4  resulting in mild biforaminal narrowing .   Assessment/Plan:  1. Chronic low back pain  2. Peripheral neuropathy  3. Bilateral meralgia paresthetica  4. Obesity  5. Bilateral lower extremity lymphedema  The patient has ongoing problems with low back pain, but he has responded well to epidural steroid injections in the past. The patient was given a prescription for hydrocodone today, and he will have a repeat epidural steroid injection. He will followup through this office in approximately 6 months. The patient needs to lose weight.  Marlan Palau. Keith [name] MD 04/14/2014 12:33 PM  [facility] [address] [place], [place] [address]  Phone [phone] Fax [phone]

## 2014-04-15 ENCOUNTER — Other Ambulatory Visit: Payer: Self-pay | Admitting: Neurology

## 2014-04-15 DIAGNOSIS — M545 Low back pain, unspecified: Secondary | ICD-10-CM

## 2014-04-15 DIAGNOSIS — G63 Polyneuropathy in diseases classified elsewhere: Secondary | ICD-10-CM

## 2014-04-15 DIAGNOSIS — R269 Unspecified abnormalities of gait and mobility: Secondary | ICD-10-CM

## 2014-04-15 DIAGNOSIS — G5710 Meralgia paresthetica, unspecified lower limb: Secondary | ICD-10-CM

## 2014-04-18 ENCOUNTER — Ambulatory Visit
Admission: RE | Admit: 2014-04-18 | Discharge: 2014-04-18 | Disposition: A | Discharge status: Home / Self Care | Payer: Medicare Other | Source: Ambulatory Visit | Attending: Neurology | Admitting: Neurology

## 2014-04-18 VITALS — BP 158/83 | HR 109

## 2014-04-18 DIAGNOSIS — IMO0002 Reserved for concepts with insufficient information to code with codable children: Secondary | ICD-10-CM

## 2014-04-18 DIAGNOSIS — M545 Low back pain, unspecified: Secondary | ICD-10-CM

## 2014-04-18 DIAGNOSIS — G63 Polyneuropathy in diseases classified elsewhere: Secondary | ICD-10-CM

## 2014-04-18 DIAGNOSIS — G5710 Meralgia paresthetica, unspecified lower limb: Secondary | ICD-10-CM

## 2014-04-18 DIAGNOSIS — R269 Unspecified abnormalities of gait and mobility: Secondary | ICD-10-CM

## 2014-04-18 MED ORDER — METHYLPREDNISOLONE ACETATE 40 MG/ML INJ SUSP (RADIOLOG
120.0000 mg | Freq: Once | INTRAMUSCULAR | Status: AC
  Administered 2014-04-18: 120 mg via EPIDURAL

## 2014-04-18 MED ORDER — IOHEXOL 180 MG/ML  SOLN
1.0000 mL | Freq: Once | INTRAMUSCULAR | Status: AC | PRN
  Administered 2014-04-18: 1 mL via EPIDURAL

## 2014-04-18 NOTE — Discharge Instructions (Signed)

## 2014-05-21 ENCOUNTER — Other Ambulatory Visit: Payer: Self-pay | Admitting: Neurology

## 2014-05-21 MED ORDER — HYDROCODONE-ACETAMINOPHEN 5-325 MG PO TABS: 1.0000 | ORAL_TABLET | Freq: Four times a day (QID) | ORAL | Status: DC | PRN

## 2014-05-21 NOTE — Telephone Encounter (Signed)
Called pt to inform him that his Rx was ready to be picked up at the front desk and if she has any other problems, questions or concerns to call the office. Pt verbalized understanding. °

## 2014-05-21 NOTE — Telephone Encounter (Signed)
Request forwarded to provider for approval  

## 2014-05-21 NOTE — Telephone Encounter (Signed)
Patient requesting Rx refill for HYDROcodone-acetaminophen (NORCO/VICODIN) 5-325 MG per tablet, has one tablet left.  Please call anytime when ready for Rx pick up and may leave message if not available.  Thanks

## 2014-06-14 ENCOUNTER — Encounter (HOSPITAL_COMMUNITY): Payer: Self-pay | Admitting: Emergency Medicine

## 2014-06-14 ENCOUNTER — Emergency Department (HOSPITAL_COMMUNITY)
Admission: EM | Admit: 2014-06-14 | Discharge: 2014-06-14 | Disposition: A | Discharge status: Home / Self Care | Payer: Medicare Other | Attending: Emergency Medicine | Admitting: Emergency Medicine

## 2014-06-14 DIAGNOSIS — Z9981 Dependence on supplemental oxygen: Secondary | ICD-10-CM | POA: Insufficient documentation

## 2014-06-14 DIAGNOSIS — G8929 Other chronic pain: Secondary | ICD-10-CM | POA: Insufficient documentation

## 2014-06-14 DIAGNOSIS — L02419 Cutaneous abscess of limb, unspecified: Secondary | ICD-10-CM | POA: Diagnosis not present

## 2014-06-14 DIAGNOSIS — Z791 Long term (current) use of non-steroidal anti-inflammatories (NSAID): Secondary | ICD-10-CM | POA: Insufficient documentation

## 2014-06-14 DIAGNOSIS — M7989 Other specified soft tissue disorders: Secondary | ICD-10-CM | POA: Insufficient documentation

## 2014-06-14 DIAGNOSIS — Z792 Long term (current) use of antibiotics: Secondary | ICD-10-CM | POA: Diagnosis not present

## 2014-06-14 DIAGNOSIS — Z7982 Long term (current) use of aspirin: Secondary | ICD-10-CM | POA: Insufficient documentation

## 2014-06-14 DIAGNOSIS — G4733 Obstructive sleep apnea (adult) (pediatric): Secondary | ICD-10-CM | POA: Diagnosis not present

## 2014-06-14 DIAGNOSIS — I10 Essential (primary) hypertension: Secondary | ICD-10-CM | POA: Insufficient documentation

## 2014-06-14 DIAGNOSIS — Z8719 Personal history of other diseases of the digestive system: Secondary | ICD-10-CM | POA: Diagnosis not present

## 2014-06-14 DIAGNOSIS — E669 Obesity, unspecified: Secondary | ICD-10-CM | POA: Insufficient documentation

## 2014-06-14 DIAGNOSIS — Z79899 Other long term (current) drug therapy: Secondary | ICD-10-CM | POA: Insufficient documentation

## 2014-06-14 DIAGNOSIS — M79609 Pain in unspecified limb: Secondary | ICD-10-CM

## 2014-06-14 DIAGNOSIS — L03119 Cellulitis of unspecified part of limb: Principal | ICD-10-CM

## 2014-06-14 DIAGNOSIS — L0291 Cutaneous abscess, unspecified: Secondary | ICD-10-CM

## 2014-06-14 HISTORY — DX: Unspecified cataract: H26.9

## 2014-06-14 LAB — CBC WITH DIFFERENTIAL/PLATELET
BASOS ABS: 0.1 10*3/uL (ref 0.0–0.1)
Basophils Relative: 1 % (ref 0–1)
EOS PCT: 5 % (ref 0–5)
Eosinophils Absolute: 0.7 10*3/uL (ref 0.0–0.7)
HCT: 43.5 % (ref 39.0–52.0)
Hemoglobin: 14.3 g/dL (ref 13.0–17.0)
LYMPHS PCT: 14 % (ref 12–46)
Lymphs Abs: 2 10*3/uL (ref 0.7–4.0)
MCH: 30.9 pg (ref 26.0–34.0)
MCHC: 32.9 g/dL (ref 30.0–36.0)
MCV: 94 fL (ref 78.0–100.0)
Monocytes Absolute: 1.4 10*3/uL — ABNORMAL HIGH (ref 0.1–1.0)
Monocytes Relative: 10 % (ref 3–12)
Neutro Abs: 10.4 10*3/uL — ABNORMAL HIGH (ref 1.7–7.7)
Neutrophils Relative %: 72 % (ref 43–77)
PLATELETS: 363 10*3/uL (ref 150–400)
RBC: 4.63 MIL/uL (ref 4.22–5.81)
RDW: 13.2 % (ref 11.5–15.5)
WBC: 14.5 10*3/uL — ABNORMAL HIGH (ref 4.0–10.5)

## 2014-06-14 LAB — BASIC METABOLIC PANEL
ANION GAP: 14 (ref 5–15)
BUN: 15 mg/dL (ref 6–23)
CALCIUM: 9.1 mg/dL (ref 8.4–10.5)
CO2: 25 mEq/L (ref 19–32)
CREATININE: 1.05 mg/dL (ref 0.50–1.35)
Chloride: 95 mEq/L — ABNORMAL LOW (ref 96–112)
GFR calc Af Amer: 88 mL/min — ABNORMAL LOW (ref >90)
GFR calc non Af Amer: 76 mL/min — ABNORMAL LOW (ref >90)
GLUCOSE: 116 mg/dL — AB (ref 70–99)
Potassium: 4.7 mEq/L (ref 3.7–5.3)
Sodium: 134 mEq/L — ABNORMAL LOW (ref 137–147)

## 2014-06-14 LAB — PROTIME-INR
INR: 1.18 (ref 0.00–1.49)
Prothrombin Time: 15 seconds (ref 11.6–15.2)

## 2014-06-14 MED ORDER — ONDANSETRON 4 MG PO TBDP
8.0000 mg | ORAL_TABLET | Freq: Once | ORAL | Status: AC
  Administered 2014-06-14: 8 mg via ORAL
  Filled 2014-06-14: qty 2

## 2014-06-14 MED ORDER — SULFAMETHOXAZOLE-TMP DS 800-160 MG PO TABS: 1.0000 | ORAL_TABLET | Freq: Two times a day (BID) | ORAL | Status: DC

## 2014-06-14 MED ORDER — OXYCODONE-ACETAMINOPHEN 5-325 MG PO TABS
1.0000 | ORAL_TABLET | Freq: Once | ORAL | Status: AC
  Administered 2014-06-14: 1 via ORAL
  Filled 2014-06-14: qty 1

## 2014-06-14 MED ORDER — SULFAMETHOXAZOLE-TMP DS 800-160 MG PO TABS
1.0000 | ORAL_TABLET | Freq: Once | ORAL | Status: AC
  Administered 2014-06-14: 1 via ORAL
  Filled 2014-06-14: qty 1

## 2014-06-14 MED ORDER — CEPHALEXIN 500 MG PO CAPS: 500.0000 mg | ORAL_CAPSULE | Freq: Four times a day (QID) | ORAL | Status: DC

## 2014-06-14 NOTE — ED Notes (Addendum)
Pt was moved to Fast [facility] room to get doppler study. Tech called out to notify RN that pt was bleeding. Upon assessment, pt has abscess to L leg that has opened and begun draining. Charge nurse notified.

## 2014-06-14 NOTE — ED Provider Notes (Signed)
CSN: [id number]     Arrival date & time 06/14/14  1643 History   First MD Initiated Contact with Patient 06/14/14 1929     Chief Complaint  Patient presents with  . Leg Swelling     (Consider location/radiation/quality/duration/timing/severity/associated sxs/prior Treatment) HPI Comments: Patient presents to the ER for evaluation of leg swelling. Patient reports that for the last one week he is demonstrating increased swelling of the left leg. He denies any injury. He was sent to ER by his primary physician to further evaluate for DVT or cellulitis. He has not had any fever. There is no chest pain or shortness of breath.  Patient reports that he has noticed progressive swelling in the anteromedial aspect of the left lower leg today. He reports a large nodule in this region which is tender to touch.   Past Medical History  Diagnosis Date  . Peripheral neuropathy   . Obesity   . Obstructive sleep apnea on CPAP   . Hydrocephalus   . History of headache   . Peripheral edema     Chronic venous stasis  . Gait disorder   . Hypertension   . History of melanoma   . Chronic insomnia   . Chronic low back pain   . Pancreatitis     Secondary to gallstones  . Pancreatitis due to biliary obstruction   . Cataracts, bilateral    Past Surgical History  Procedure Laterality Date  . Lumbar spine surgery    . Carpal tunnel release Bilateral   . Inguinal hernia repair    . Cholecystectomy    . Tonsillectomy     Family History  Problem Relation Age of Onset  . Cancer Mother     Liver cancer  . Cancer Father     brain cancer  . Cancer Maternal Grandmother     Liver cancer  . Heart attack Maternal Grandfather    History  Substance Use Topics  . Smoking status: Never Smoker   . Smokeless tobacco: Not on file  . Alcohol Use: Yes     Comment: Consumes alcohol on occasion    Review of Systems  Skin: Positive for color change and wound.  All other systems reviewed and are  negative.     Allergies  Review of patient's allergies indicates no known allergies.  Home Medications   Prior to Admission medications   Medication Sig Start Date End Date Taking? Authorizing Provider  ammonium lactate (AMLACTIN) 12 % cream Apply 1 application topically 2 (two) times daily as needed for dry skin.  03/19/14  Yes Historical Provider, MD  aspirin 81 MG tablet Take 81 mg by mouth daily.   Yes Historical Provider, MD  cephALEXin (KEFLEX) 500 MG capsule Take 500 mg by mouth every 6 (six) hours as needed. Legs flair up   Yes Historical Provider, MD  diclofenac (VOLTAREN) 75 MG EC tablet Take 75 mg by mouth 2 (two) times daily.   Yes Historical Provider, MD  furosemide (LASIX) 40 MG tablet Take 40 mg by mouth 2 (two) times daily.   Yes Historical Provider, MD  gabapentin (NEURONTIN) 300 MG capsule Take 300 mg by mouth 4 (four) times daily.   Yes Historical Provider, MD  HYDROcodone-acetaminophen (NORCO/VICODIN) 5-325 MG per tablet Take 1 tablet by mouth every 6 (six) hours as needed (Must last 28 days.). 05/21/14  Yes York Spaniel, MD  nortriptyline (PAMELOR) 10 MG capsule Take 10 mg by mouth 3 (three) times daily.   Yes Historical  Provider, MD  ramipril (ALTACE) 10 MG capsule Take 10 mg by mouth 2 (two) times daily.   Yes Historical Provider, MD  tiZANidine (ZANAFLEX) 4 MG capsule Take 4 mg by mouth 3 (three) times daily.   Yes Historical Provider, MD  traZODone (DESYREL) 50 MG tablet Take 1 tablet by mouth at bedtime. 03/19/14  Yes Historical Provider, MD  triamcinolone cream (KENALOG) 0.1 % Apply 1 application topically daily as needed. For rash   Yes Historical Provider, MD  sulfamethoxazole-trimethoprim (BACTRIM DS) 800-160 MG per tablet Take 1 tablet by mouth 2 (two) times daily. 06/14/14   Gilda Crease, MD   BP 102/58  Pulse 88  Temp(Src) 98.9 F (37.2 C) (Oral)  Resp 18  SpO2 96% Physical Exam  Constitutional: He is oriented to person, place, and time. He  appears well-developed and well-nourished. No distress.  HENT:  Head: Normocephalic and atraumatic.  Right Ear: Hearing normal.  Left Ear: Hearing normal.  Nose: Nose normal.  Mouth/Throat: Oropharynx is clear and moist and mucous membranes are normal.  Eyes: Conjunctivae and EOM are normal. Pupils are equal, round, and reactive to light.  Neck: Normal range of motion. Neck supple.  Cardiovascular: Regular rhythm, S1 normal and S2 normal.  Exam reveals no gallop and no friction rub.   No murmur heard. Pulmonary/Chest: Effort normal and breath sounds normal. No respiratory distress. He exhibits no tenderness.  Abdominal: Soft. Normal appearance and bowel sounds are normal. There is no hepatosplenomegaly. There is no tenderness. There is no rebound, no guarding, no tenderness at McBurney's point and negative Murphy's sign. No hernia.  Musculoskeletal: Normal range of motion.  Neurological: He is alert and oriented to person, place, and time. He has normal strength. No cranial nerve deficit or sensory deficit. Coordination normal. GCS eye subscore is 4. GCS verbal subscore is 5. GCS motor subscore is 6.  Skin: Skin is warm, dry and intact. No rash noted. No cyanosis.  Significant stasis dermatitis bilateral lower extremities  1 cm opening in the skin anterolateral left lower leg bleeding, oozing some serosanguineous fluid as well  Psychiatric: He has a normal mood and affect. His speech is normal and behavior is normal. Thought content normal.    ED Course  Procedures (including critical care time) Labs Review Labs Reviewed  CBC WITH DIFFERENTIAL - Abnormal; Notable for the following:    WBC 14.5 (*)    Neutro Abs 10.4 (*)    Monocytes Absolute 1.4 (*)    All other components within normal limits  BASIC METABOLIC PANEL - Abnormal; Notable for the following:    Sodium 134 (*)    Chloride 95 (*)    Glucose, Bld 116 (*)    GFR calc non Af Amer 76 (*)    GFR calc Af Amer 88 (*)    All  other components within normal limits  PROTIME-INR    Imaging Review No results found.   EKG Interpretation None      MDM   Final diagnoses:  Abscess   Patient presented for pain and swelling of the left leg. He reports a small nodule in the anteromedial aspect of the left lower leg that has been getting larger. Venous duplex was ordered and was performed prior to my evaluation. Apparently during the study, the nodule burst open and started bleeding. Nurses feel like there was some pus in there, and now he has mild oozing of blood as well as some serous sanguinous inflammatory fluid from the edematous  skin. There may be some very slight surrounding erythema in the region. Hemostasis was easily achieved with simple pressure. Laboratory unremarkable other than slightly elevated white count. Patient is not febrile. He will be initiated on Keflex and Bactrim empirically to treat for possible cellulitis and suspected spontaneously drained abscess. Venous duplex was negative for DVT. Patient is to have follow up with primary doctor on Monday, return if his symptoms worsen.    Gilda Crease, MD 06/14/14 2234

## 2014-06-14 NOTE — ED Notes (Signed)
Dressing applied to wound, wound cleaned, blood cleaned up off of legs.

## 2014-06-14 NOTE — Progress Notes (Signed)
*  PRELIMINARY RESULTS* Vascular Ultrasound Left lower extremity venous duplex has been completed.  Preliminary findings: No evidence of DVT of the left common femoral, femoral, and popliteal veins. Could not image calf veins due to actively draining medial calf.  Farrel Demark, RDMS, RVT  06/14/2014, 7:43 PM

## 2014-06-14 NOTE — ED Notes (Signed)
Presents with one week of left leg pain and swelling behind left leg. Sent over by [facility] physicians for doppler and r/o cellulitis.  Denies fevers. Denies cp and SOB. Venous stasis noted to bilateral lower extremities.

## 2014-06-14 NOTE — Discharge Instructions (Signed)

## 2014-06-27 ENCOUNTER — Telehealth: Payer: Self-pay | Admitting: Neurology

## 2014-06-27 MED ORDER — HYDROCODONE-ACETAMINOPHEN 7.5-325 MG PO TABS: 1.0000 | ORAL_TABLET | Freq: Four times a day (QID) | ORAL | Status: DC | PRN

## 2014-06-27 NOTE — Telephone Encounter (Signed)
Patient requesting Rx refill for HYDROcodone-acetaminophen (NORCO/VICODIN) 5-325 MG per tablet, patient has only two pills left.  Please call when ready for pick up.

## 2014-06-27 NOTE — Telephone Encounter (Signed)
I called patient. The patient gets 40 of the 5 mg hydrocodone tablets a month. I will go up to 7.5 mg tablet, but if the pain remains significant, we will need to find other means to control the pain.

## 2014-06-27 NOTE — Telephone Encounter (Signed)
Patient calling for the doctor to prescribe the next higher dose on the Hydrocodone.  Please advise.

## 2014-08-04 ENCOUNTER — Other Ambulatory Visit: Payer: Self-pay | Admitting: Neurology

## 2014-08-07 ENCOUNTER — Other Ambulatory Visit: Payer: Self-pay | Admitting: Neurology

## 2014-08-07 MED ORDER — HYDROCODONE-ACETAMINOPHEN 7.5-325 MG PO TABS: 1.0000 | ORAL_TABLET | Freq: Four times a day (QID) | ORAL | Status: DC | PRN

## 2014-08-07 NOTE — Telephone Encounter (Signed)
I called the patient to let them know their Rx for Norco/Hydrocodone was ready for pickup. Patient was instructed to bring Photo ID. 

## 2014-08-07 NOTE — Telephone Encounter (Signed)
Request entered, forwarded to provider for approval.  

## 2014-08-07 NOTE — Telephone Encounter (Signed)
Patient requesting refill of hydrocodone, please call when ready for pick up.  °

## 2014-08-28 ENCOUNTER — Other Ambulatory Visit: Payer: Self-pay | Admitting: Neurology

## 2014-09-04 ENCOUNTER — Encounter: Payer: Self-pay | Admitting: Neurology

## 2014-09-10 ENCOUNTER — Encounter: Payer: Self-pay | Admitting: Neurology

## 2014-09-16 ENCOUNTER — Other Ambulatory Visit: Payer: Self-pay | Admitting: Neurology

## 2014-09-16 MED ORDER — HYDROCODONE-ACETAMINOPHEN 7.5-325 MG PO TABS: 1.0000 | ORAL_TABLET | Freq: Four times a day (QID) | ORAL | Status: DC | PRN

## 2014-09-16 NOTE — Telephone Encounter (Signed)
Request entered, forwarded to provider for approval.  

## 2014-09-16 NOTE — Telephone Encounter (Signed)
Patient requesting Rx refill for HYDROcodone-acetaminophen (NORCO) 7.5-325 MG per tablet.  Please call when ready for pick up.

## 2014-09-17 ENCOUNTER — Telehealth: Payer: Self-pay | Admitting: *Deleted

## 2014-09-17 NOTE — Telephone Encounter (Signed)
I called the patient to let them know their Rx for Hydrocodone was ready for pickup. Patient was instructed to bring Photo ID. 

## 2014-09-19 NOTE — Telephone Encounter (Signed)
Spoke to patient and relayed handicap form is ready.  He will come tomorrow to pick up at front desk.

## 2014-09-24 ENCOUNTER — Other Ambulatory Visit: Payer: Self-pay | Admitting: Neurology

## 2014-10-14 ENCOUNTER — Encounter: Payer: Self-pay | Admitting: Adult Health

## 2014-10-14 ENCOUNTER — Ambulatory Visit (INDEPENDENT_AMBULATORY_CARE_PROVIDER_SITE_OTHER): Payer: Medicare Other | Admitting: Adult Health

## 2014-10-14 VITALS — BP 151/76 | HR 106 | Ht 73.0 in | Wt 332.0 lb

## 2014-10-14 DIAGNOSIS — M545 Low back pain: Secondary | ICD-10-CM

## 2014-10-14 DIAGNOSIS — E669 Obesity, unspecified: Secondary | ICD-10-CM

## 2014-10-14 DIAGNOSIS — G5710 Meralgia paresthetica, unspecified lower limb: Secondary | ICD-10-CM

## 2014-10-14 DIAGNOSIS — G8929 Other chronic pain: Secondary | ICD-10-CM

## 2014-10-14 DIAGNOSIS — G63 Polyneuropathy in diseases classified elsewhere: Secondary | ICD-10-CM

## 2014-10-14 NOTE — Progress Notes (Signed)
PATIENT: [name] DOB: 10/15/1956  REASON FOR VISIT: follow up HISTORY FROM: patient  HISTORY OF PRESENT ILLNESS: [name] is a 58 year old male with a history of obesity, chronic low back pain, peripheral neuropathy, lymphedema of the legs and bilateral meralgia paresthetica. He returns today for follow-up. He is currently taking nortriptyline 30 mg daily QID and hydrocodone as needed for pain. He normally only takes the hydrocodone at night to help him sleep with his back pain.  His last epidural steroid injection was 04/18/14 and he reports that it was helpful. He reports that he lasted about 2 1/2 months. He states 76 after the injection his pain had improved and he was able to sleep at night. He continues to use a cane to ambulate. No falls since the last visit. He continues to have the numbness in bilateral thighs. Patient is trying to diet. He states that he has cut back his portions and is trying to drink water. He states that he is unable to exercise due to his legs. However with his new insurance he can participate in silver sneakers.   HISTORY 04/12/14 ([name]): [age] year old right-handed white male with a history of obesity, chronic low back pain, peripheral neuropathy, lymphedema of the legs, and bilateral meralgia paresthetica. The patient has gotten epidural steroid injections in the past, the last procedure was done in December of 2014. The was able to eliminate the pain in the back almost completely for about one month. The patient is on nortriptyline at night, taking 30 mg. Indicates that he sleeps fairly well. The patient continues to have tingling sensations in the anterolateral thighs bilaterally. He recently has had blisters, on the right lower extremity associated with his lymphedema, and they are currently draining. He returns to this office for an evaluation. He will occasionally take hydrocodone if needed for pain. The patient has some gait instability, he has fallen several  times in the last 2 or 3 weeks. He has a cane, but he does not consistently use this.  REVIEW OF SYSTEMS: Out of a complete 14 system review of symptoms, the patient complains only of the following symptoms, and all other reviewed systems are negative.  Eye redness, loss of vision, blurred vision, ringing in ears, leg swelling, apnea, daytime sleepiness, snoring, back pain, walking difficulty, neck stiffness, numbness, weakness  ALLERGIES: No Known Allergies  HOME MEDICATIONS: Outpatient Prescriptions Prior to Visit  Medication Sig Dispense Refill  . ammonium lactate (AMLACTIN) 12 % cream Apply 1 application topically 2 (two) times daily as needed for dry skin.     Marland Kitchen. aspirin 81 MG tablet Take 81 mg by mouth daily.    . cephALEXin (KEFLEX) 500 MG capsule Take 500 mg by mouth every 6 (six) hours as needed. Legs flair up    . cephALEXin (KEFLEX) 500 MG capsule Take 1 capsule (500 mg total) by mouth 4 (four) times daily. 40 capsule 0  . diclofenac (VOLTAREN) 75 MG EC tablet Take 75 mg by mouth 2 (two) times daily.    . diclofenac (VOLTAREN) 75 MG EC tablet TAKE 1 TABLET BY MOUTH TWICE A DAY WITH FOOD 60 tablet 6  . furosemide (LASIX) 40 MG tablet Take 40 mg by mouth 2 (two) times daily.    Marland Kitchen. gabapentin (NEURONTIN) 300 MG capsule Take 300 mg by mouth 4 (four) times daily.    Marland Kitchen. HYDROcodone-acetaminophen (NORCO) 7.5-325 MG per tablet Take 1 tablet by mouth every 6 (six) hours as needed for  moderate pain (Must last 28 days). 40 tablet 0  . nortriptyline (PAMELOR) 10 MG capsule Take 10 mg by mouth 3 (three) times daily.    . nortriptyline (PAMELOR) 10 MG capsule TAKE 3 CAPSULES (30 MG TOTAL) BY MOUTH EVERY EVENING. 90 capsule 3  . ramipril (ALTACE) 10 MG capsule Take 10 mg by mouth 2 (two) times daily.    Marland Kitchen. sulfamethoxazole-trimethoprim (BACTRIM DS) 800-160 MG per tablet Take 1 tablet by mouth 2 (two) times daily. 20 tablet 0  . tiZANidine (ZANAFLEX) 4 MG tablet ONE TABLET THREE TIMES A DAY 90 tablet  3  . traZODone (DESYREL) 50 MG tablet Take 1 tablet by mouth at bedtime.    . triamcinolone cream (KENALOG) 0.1 % Apply 1 application topically daily as needed. For rash     No facility-administered medications prior to visit.    PAST MEDICAL HISTORY: Past Medical History  Diagnosis Date  . Peripheral neuropathy   . Obesity   . Obstructive sleep apnea on CPAP   . Hydrocephalus   . History of headache   . Peripheral edema     Chronic venous stasis  . Gait disorder   . Hypertension   . History of melanoma   . Chronic insomnia   . Chronic low back pain   . Pancreatitis     Secondary to gallstones  . Pancreatitis due to biliary obstruction   . Cataracts, bilateral     PAST SURGICAL HISTORY: Past Surgical History  Procedure Laterality Date  . Lumbar spine surgery    . Carpal tunnel release Bilateral   . Inguinal hernia repair    . Cholecystectomy    . Tonsillectomy      FAMILY HISTORY: Family History  Problem Relation Age of Onset  . Cancer Mother     Liver cancer  . Cancer Father     brain cancer  . Cancer Maternal Grandmother     Liver cancer  . Heart attack Maternal Grandfather     SOCIAL HISTORY: History   Social History  . Marital Status: Married    Spouse Name: N/A    Number of Children: 2  . Years of Education: 16   Occupational History  .      Employed by school system   Social History Main Topics  . Smoking status: Never Smoker   . Smokeless tobacco: Not on file  . Alcohol Use: Yes     Comment: Consumes alcohol on occasion  . Drug Use: No  . Sexual Activity: Not on file   Other Topics Concern  . Not on file   Social History Narrative  . No narrative on file      PHYSICAL EXAM  Filed Vitals:   10/14/14 0948  BP: 151/76  Pulse: 106  Height: 6\' 1"  (1.854 m)  Weight: 332 lb (150.594 kg)   Body mass index is 43.81 kg/(m^2).  Generalized: Well developed, in no acute distress  Skin: Lymphedema noted in lower legs  bilaterally  Neurological examination  Mentation: Alert oriented to time, place, history taking. Follows all commands speech and language fluent Cranial nerve II-XII: Pupils were equal round reactive to light. Extraocular movements were full, visual field were full on confrontational test. Facial sensation and strength were normal. Uvula tongue midline. Head turning and shoulder shrug  were normal and symmetric. Motor: The motor testing reveals 5 over 5 strength of all 4 extremities. Good symmetric motor tone is noted throughout.  Sensory: Sensory testing is intact to soft  touch on all 4 extremities. No evidence of extinction is noted.  Coordination: Cerebellar testing reveals good finger-nose-finger and heel-to-shin bilaterally.  Gait and station: Gait is slightly wide-based. Uses a cane for ambulation. Tandem gait was not attempted. Romberg is negative. No drift is seen.  Reflexes: Deep tendon reflexes are symmetric but depressed throughout. Marland Kitchen   DIAGNOSTIC DATA (LABS, IMAGING, TESTING) - I reviewed patient records, labs, notes, testing and imaging myself where available.  Lab Results  Component Value Date   WBC 14.5* 06/14/2014   HGB 14.3 06/14/2014   HCT 43.5 06/14/2014   MCV 94.0 06/14/2014   PLT 363 06/14/2014      Component Value Date/Time   NA 134* 06/14/2014 1930   K 4.7 06/14/2014 1930   CL 95* 06/14/2014 1930   CO2 25 06/14/2014 1930   GLUCOSE 116* 06/14/2014 1930   BUN 15 06/14/2014 1930   CREATININE 1.05 06/14/2014 1930   CALCIUM 9.1 06/14/2014 1930   PROT 6.6 12/04/2009 0826   ALBUMIN 3.2* 12/04/2009 0826   AST 49* 12/04/2009 0826   ALT 80* 12/04/2009 0826   ALKPHOS 77 12/04/2009 0826   BILITOT 1.6* 12/04/2009 0826   GFRNONAA 76* 06/14/2014 1930   GFRAA 88* 06/14/2014 1930      ASSESSMENT AND PLAN 58 y.o. year old male  has a past medical history of Peripheral neuropathy; Obesity; Obstructive sleep apnea on CPAP; Hydrocephalus; History of headache;  Peripheral edema; Gait disorder; Hypertension; History of melanoma; Chronic insomnia; Chronic low back pain; Pancreatitis; Pancreatitis due to biliary obstruction; and Cataracts, bilateral. here with:  1. Chronic low back pain 2. Peripheral neuropathy 3. Bilateral meralgia paresthetica 4. Obesity  The patient continues to have chronic low back pain. He states after his epidural steroid injection he did notice good benefit for 2-1/2 months. He states that 7 days after his injection his pain had improved and he was able to sleep well at night. I will set the patient up for another lumbar epidural steroid injection. He will continue taking the hydrocodone as needed for pain. He will continue taking nortriptyline daily. In the future he may benefit from a pain clinic. If his symptoms worsen or he develops new symptoms he should let us know. Otherwise he will follow-up in 6 months.   Butch Penny, MSN, NP-C 10/14/2014, 9:48 AM [facility] [address], [address] [place], [place] [address] [phone]  Note: This document was prepared with digital dictation and possible smart phrase technology. Any transcriptional errors that result from this process are unintentional.

## 2014-10-14 NOTE — Patient Instructions (Signed)

## 2014-10-14 NOTE — Progress Notes (Signed)
I have read the note, and I agree with the clinical assessment and plan.  [name]   

## 2014-10-25 ENCOUNTER — Ambulatory Visit
Admission: RE | Admit: 2014-10-25 | Discharge: 2014-10-25 | Disposition: A | Discharge status: Home / Self Care | Payer: Medicare Other | Source: Ambulatory Visit | Attending: Adult Health | Admitting: Adult Health

## 2014-10-25 ENCOUNTER — Other Ambulatory Visit: Payer: Self-pay | Admitting: Neurology

## 2014-10-25 DIAGNOSIS — M545 Low back pain, unspecified: Secondary | ICD-10-CM

## 2014-10-25 DIAGNOSIS — G8929 Other chronic pain: Secondary | ICD-10-CM

## 2014-10-25 MED ORDER — HYDROCODONE-ACETAMINOPHEN 7.5-325 MG PO TABS: 1.0000 | ORAL_TABLET | Freq: Four times a day (QID) | ORAL | Status: DC | PRN

## 2014-10-25 MED ORDER — IOHEXOL 180 MG/ML  SOLN
1.0000 mL | Freq: Once | INTRAMUSCULAR | Status: AC | PRN
  Administered 2014-10-25: 1 mL via EPIDURAL

## 2014-10-25 MED ORDER — METHYLPREDNISOLONE ACETATE 40 MG/ML INJ SUSP (RADIOLOG
120.0000 mg | Freq: Once | INTRAMUSCULAR | Status: AC
  Administered 2014-10-25: 120 mg via EPIDURAL

## 2014-10-25 NOTE — Telephone Encounter (Signed)
Pt is calling to request a written Rx for HYDROcodone-acetaminophen (NORCO) 7.5-325 MG per tablet. Wants to know if he can possibly get this today.  Please call and advise.

## 2014-10-25 NOTE — Discharge Instructions (Signed)

## 2014-10-25 NOTE — Telephone Encounter (Signed)
Request entered, forwarded to provider for approval.  

## 2014-10-28 NOTE — Telephone Encounter (Signed)
I called the patient to let them know their Rx for Hydrocodone was ready for pickup. Patient was instructed to bring Photo ID. 

## 2014-11-13 ENCOUNTER — Ambulatory Visit
Admission: RE | Admit: 2014-11-13 | Discharge: 2014-11-13 | Disposition: A | Discharge status: Home / Self Care | Payer: Medicare Other | Source: Ambulatory Visit | Attending: Family Medicine | Admitting: Family Medicine

## 2014-11-13 ENCOUNTER — Other Ambulatory Visit: Payer: Self-pay | Admitting: Family Medicine

## 2014-11-13 DIAGNOSIS — W19XXXA Unspecified fall, initial encounter: Secondary | ICD-10-CM

## 2014-12-13 ENCOUNTER — Other Ambulatory Visit: Payer: Self-pay | Admitting: Neurology

## 2014-12-13 MED ORDER — HYDROCODONE-ACETAMINOPHEN 7.5-325 MG PO TABS: 1.0000 | ORAL_TABLET | Freq: Four times a day (QID) | ORAL | Status: DC | PRN

## 2014-12-13 NOTE — Telephone Encounter (Signed)
Request entered, forwarded to provider for approval.  

## 2014-12-13 NOTE — Telephone Encounter (Signed)
Patient is calling to get a written Rx for HYDROcodone-acetaminophen (NORCO) 7.5-325 MG per tablet. Patient sates he is completely out of the medication and has been for three days.. Please call patient when ready for pickup. Thank you.

## 2014-12-16 ENCOUNTER — Telehealth: Payer: Self-pay | Admitting: *Deleted

## 2014-12-16 NOTE — Telephone Encounter (Signed)
Patient calling wanting to know if is Rx is ready for pick up. Please advise.

## 2014-12-16 NOTE — Telephone Encounter (Signed)
Called patient and informed Rx ready for pick up at front desk.Spoke to spouse, she verbalized understanding.

## 2014-12-17 ENCOUNTER — Telehealth: Payer: Self-pay | Admitting: Neurology

## 2014-12-17 NOTE — Telephone Encounter (Signed)
[name] has not slept in 6 nights because he has been without his medication.   And he wants to know how he can keep this from not happening. He said he called on this medication last Thursday, and wants to get this RX before it goes out, so that way he will be able to sleep.

## 2014-12-17 NOTE — Telephone Encounter (Signed)
A message was left with  Patient 's wife that his medication was ready.  I also left a message that it was ready, and called his wife who relayed that he came to pick up Rx today from office.

## 2015-01-19 ENCOUNTER — Other Ambulatory Visit: Payer: Self-pay | Admitting: Neurology

## 2015-01-20 ENCOUNTER — Other Ambulatory Visit: Payer: Self-pay | Admitting: Neurology

## 2015-01-20 MED ORDER — HYDROCODONE-ACETAMINOPHEN 7.5-325 MG PO TABS: 1.0000 | ORAL_TABLET | Freq: Four times a day (QID) | ORAL | Status: DC | PRN

## 2015-01-20 NOTE — Telephone Encounter (Signed)
Pt is calling requesting a written Rx for HYDROcodone-acetaminophen (NORCO) 7.5-325 MG per tablet. Please call when ready for pick up. °

## 2015-01-21 ENCOUNTER — Telehealth: Payer: Self-pay

## 2015-01-21 NOTE — Telephone Encounter (Signed)
Called patient and informed Rx ready for pick up at front desk. Patient verbalized understanding.  

## 2015-03-04 ENCOUNTER — Other Ambulatory Visit: Payer: Self-pay | Admitting: Neurology

## 2015-03-04 MED ORDER — HYDROCODONE-ACETAMINOPHEN 7.5-325 MG PO TABS: 1.0000 | ORAL_TABLET | Freq: Four times a day (QID) | ORAL | Status: DC | PRN

## 2015-03-04 NOTE — Telephone Encounter (Signed)
Patient called requesting a refill for HYDROcodone-acetaminophen (NORCO) 7.5-325 MG per tablet. Patient can be reached @ [phone]. This patient was made aware that the script will be ready for pick up in 24hrs unless he is advised otherwise by the nurse.

## 2015-03-04 NOTE — Telephone Encounter (Signed)
Request entered, forwarded to provider for approval.  

## 2015-03-05 ENCOUNTER — Telehealth: Payer: Self-pay

## 2015-03-05 NOTE — Telephone Encounter (Signed)
Rx ready for pick up. 

## 2015-04-11 ENCOUNTER — Telehealth: Payer: Self-pay | Admitting: Neurology

## 2015-04-11 MED ORDER — NORTRIPTYLINE HCL 10 MG PO CAPS: ORAL_CAPSULE | ORAL | Status: AC

## 2015-04-11 MED ORDER — DICLOFENAC SODIUM 75 MG PO TBEC: 75.0000 mg | DELAYED_RELEASE_TABLET | Freq: Two times a day (BID) | ORAL | Status: DC

## 2015-04-11 MED ORDER — HYDROCODONE-ACETAMINOPHEN 7.5-325 MG PO TABS: 1.0000 | ORAL_TABLET | Freq: Four times a day (QID) | ORAL | Status: DC | PRN

## 2015-04-11 MED ORDER — TIZANIDINE HCL 4 MG PO TABS: 4.0000 mg | ORAL_TABLET | Freq: Three times a day (TID) | ORAL | Status: DC

## 2015-04-11 NOTE — Telephone Encounter (Signed)
Will refill his medications.

## 2015-04-11 NOTE — Telephone Encounter (Signed)
Patient called stating he was changing pharmacy to [facility] from [facility] and will need 90 day supply. Please change in computer. Patient can be reached at [phone]. Patient needs refill for HYDROcodone-acetaminophen (NORCO) 7.5-325 MG per tablet . RX will be ready within 24 hours unless otherwise informed by RN.

## 2015-04-11 NOTE — Telephone Encounter (Signed)
[facility] has been added to chart per patient request.  I called back and asked the patient if he needs meds sent to [facility] at this time.  He asked that we send Nortriptyline, Diclofenac and Tizanidine all for 90 days per fill.  He is requesting regular Hydrocodone Rx, as he will continue to get this from local pharmacy.   Request entered, forwarded to provider for approval.

## 2015-04-14 ENCOUNTER — Telehealth: Payer: Self-pay

## 2015-04-14 NOTE — Telephone Encounter (Signed)
Rx ready for pick up. 

## 2015-04-22 ENCOUNTER — Encounter: Payer: Self-pay | Admitting: Adult Health

## 2015-04-22 ENCOUNTER — Ambulatory Visit (INDEPENDENT_AMBULATORY_CARE_PROVIDER_SITE_OTHER): Payer: Medicare Other | Admitting: Adult Health

## 2015-04-22 VITALS — BP 140/83 | HR 83 | Ht 73.0 in | Wt 333.0 lb

## 2015-04-22 DIAGNOSIS — M545 Low back pain: Secondary | ICD-10-CM

## 2015-04-22 DIAGNOSIS — G63 Polyneuropathy in diseases classified elsewhere: Secondary | ICD-10-CM

## 2015-04-22 DIAGNOSIS — G5710 Meralgia paresthetica, unspecified lower limb: Secondary | ICD-10-CM | POA: Diagnosis not present

## 2015-04-22 DIAGNOSIS — G8929 Other chronic pain: Secondary | ICD-10-CM | POA: Diagnosis not present

## 2015-04-22 NOTE — Progress Notes (Signed)
PATIENT: [name] DOB: 01/19/1956  REASON FOR VISIT: follow up- peripheral neuropathy, bilateral meralgia paresthetica, chronic low back pain, obesity HISTORY FROM: patient  HISTORY OF PRESENT ILLNESS: [name] is a 59 -year-old male with a history of obesity, chronic low back pain, peripheral neuropathy, lymphedema of the legs and bilateral meralgia paresthetica. He returns today for follow-up. The patient continues to take nortriptyline 30 mg at bedtime and hydrocodone as needed. He states that these medications are working well for him. He states that the hydrocodone especially helps with his back pain that occurs at bedtime. Patient states that he just recently had an episode of "venous stasis" on the left lower leg. He states that it took him approximately 8 days to recover from this. He states that since then he just feels weaker on the left leg. The patient continues to have numbness in both legs but feels that the left side is worse. Denies any tingling sensations. Patient denies any other neurological symptoms. He returns today for an evaluation.  HISTORY 10/14/14: [name] is a 59 year old male with a history of obesity, chronic low back pain, peripheral neuropathy, lymphedema of the legs and bilateral meralgia paresthetica. He returns today for follow-up. He is currently taking nortriptyline 30 mg daily QID and hydrocodone as needed for pain. He normally only takes the hydrocodone at night to help him sleep with his back pain. His last epidural steroid injection was 04/18/14 and he reports that it was helpful. He reports that he lasted about 2 1/2 months. He states 76 after the injection his pain had improved and he was able to sleep at night. He continues to use a cane to ambulate. No falls since the last visit. He continues to have the numbness in bilateral thighs. Patient is trying to diet. He states that he has cut back his portions and is trying to drink water. He states that he is  unable to exercise due to his legs. However with his new insurance he can participate in silver sneakers.   HISTORY 04/12/14 ([name]): 59 year old right-handed white male with a history of obesity, chronic low back pain, peripheral neuropathy, lymphedema of the legs, and bilateral meralgia paresthetica. The patient has gotten epidural steroid injections in the past, the last procedure was done in December of 2014. The was able to eliminate the pain in the back almost completely for about one month. The patient is on nortriptyline at night, taking 30 mg. Indicates that he sleeps fairly well. The patient continues to have tingling sensations in the anterolateral thighs bilaterally. He recently has had blisters, on the right lower extremity associated with his lymphedema, and they are currently draining. He returns to this office for an evaluation. He will occasionally take hydrocodone if needed for pain. The patient has some gait instability, he has fallen several times in the last 2 or 3 weeks. He has a cane, but he does not consistently use this.  REVIEW OF SYSTEMS: Out of a complete 14 system review of symptoms, the patient complains only of the following symptoms, and all other reviewed systems are negative.  Hearing loss, ringing in ears, leg swelling, apnea, snoring, frequency of urination, back pain, walking difficulty, numbness, weakness, agitation  ALLERGIES: No Known Allergies  HOME MEDICATIONS: Outpatient Prescriptions Prior to Visit  Medication Sig Dispense Refill  . ammonium lactate (AMLACTIN) 12 % cream Apply 1 application topically 2 (two) times daily as needed for dry skin.     .Marland Kitchen  aspirin 81 MG tablet Take 81 mg by mouth daily.    Marland Kitchen atorvastatin (LIPITOR) 20 MG tablet     . cephALEXin (KEFLEX) 500 MG capsule Take 1 capsule (500 mg total) by mouth 4 (four) times daily. 40 capsule 0  . diclofenac (VOLTAREN) 75 MG EC tablet Take 1 tablet (75 mg total) by mouth 2 (two) times daily with a  meal. 180 tablet 1  . furosemide (LASIX) 40 MG tablet Take 40 mg by mouth 2 (two) times daily.    Marland Kitchen gabapentin (NEURONTIN) 300 MG capsule Take 300 mg by mouth 4 (four) times daily.    Marland Kitchen HYDROcodone-acetaminophen (NORCO) 7.5-325 MG per tablet Take 1 tablet by mouth every 6 (six) hours as needed for moderate pain (Must last 28 days). 40 tablet 0  . nortriptyline (PAMELOR) 10 MG capsule TAKE 3 CAPSULES (30 MG TOTAL) BY MOUTH EVERY EVENING. 270 capsule 1  . ramipril (ALTACE) 10 MG capsule Take 10 mg by mouth 2 (two) times daily.    Marland Kitchen sulfamethoxazole-trimethoprim (BACTRIM DS) 800-160 MG per tablet Take 1 tablet by mouth 2 (two) times daily. 20 tablet 0  . tiZANidine (ZANAFLEX) 4 MG tablet Take 1 tablet (4 mg total) by mouth 3 (three) times daily. 270 tablet 1  . traZODone (DESYREL) 50 MG tablet Take 1 tablet by mouth at bedtime.    . triamcinolone cream (KENALOG) 0.1 % Apply 1 application topically daily as needed. For rash    . cephALEXin (KEFLEX) 500 MG capsule Take 500 mg by mouth every 6 (six) hours as needed. Legs flair up     No facility-administered medications prior to visit.    PAST MEDICAL HISTORY: Past Medical History  Diagnosis Date  . Peripheral neuropathy   . Obesity   . Obstructive sleep apnea on CPAP   . Hydrocephalus   . History of headache   . Peripheral edema     Chronic venous stasis  . Gait disorder   . Hypertension   . History of melanoma   . Chronic insomnia   . Chronic low back pain   . Pancreatitis     Secondary to gallstones  . Pancreatitis due to biliary obstruction   . Cataracts, bilateral     PAST SURGICAL HISTORY: Past Surgical History  Procedure Laterality Date  . Lumbar spine surgery    . Carpal tunnel release Bilateral   . Inguinal hernia repair    . Cholecystectomy    . Tonsillectomy      FAMILY HISTORY: Family History  Problem Relation Age of Onset  . Cancer Mother     Liver cancer  . Cancer Father     brain cancer  . Cancer Maternal  Grandmother     Liver cancer  . Heart attack Maternal Grandfather     SOCIAL HISTORY: History   Social History  . Marital Status: Married    Spouse Name: Steward Drone  . Number of Children: 2  . Years of Education: 16   Occupational History  .      Employed by school system   Social History Main Topics  . Smoking status: Never Smoker   . Smokeless tobacco: Never Used  . Alcohol Use: 0.0 oz/week    0 Standard drinks or equivalent per week     Comment: Consumes alcohol on occasion  . Drug Use: No  . Sexual Activity: Not on file   Other Topics Concern  . Not on file   Social History Narrative   Patient  lives at home with his wife Steward Drone).   Patient is retired/disabled.   Education college.   Both hands.   Caffeine four cups of coffee daily.      PHYSICAL EXAM  Filed Vitals:   04/22/15 0906  BP: 140/83  Pulse: 83  Height: 6\' 1"  (1.854 m)  Weight: 333 lb (151.048 kg)   Body mass index is 43.94 kg/(m^2). Generalized: Well developed, in no acute distress  Skin: Lymphedema noted in lower legs bilaterally  Neurological examination  Mentation: Alert oriented to time, place, history taking. Follows all commands speech and language fluent Cranial nerve II-XII: Pupils were equal round reactive to light. Extraocular movements were full, visual field were full on confrontational test. Facial sensation and strength were normal. Uvula tongue midline. Head turning and shoulder shrug  were normal and symmetric. Motor: The motor testing reveals 5 over 5 strength of all 4 extremities. Good symmetric motor tone is noted throughout.  Sensory: Sensory testing is intact to soft touch on all 4 extremities. No evidence of extinction is noted.  Coordination: Cerebellar testing reveals good finger-nose-finger and heel-to-shin bilaterally.  Gait and station: Gait is slightly wide-based, uses a cane when ambulating. Tandem gait not attempted..  Reflexes: Deep tendon reflexes are symmetric and  normal bilaterally.     DIAGNOSTIC DATA (LABS, IMAGING, TESTING) - I reviewed patient records, labs, notes, testing and imaging myself where available.     ASSESSMENT AND PLAN 59 y.o. year old male  has a past medical history of Peripheral neuropathy; Obesity; Obstructive sleep apnea on CPAP; Hydrocephalus; History of headache; Peripheral edema; Gait disorder; Hypertension; History of melanoma; Chronic insomnia; Chronic low back pain; Pancreatitis; Pancreatitis due to biliary obstruction; and Cataracts, bilateral. here with:  1. Low back pain 2. Peripheral neuropathy 3. Bilateral meralgia paresthetica 4. Obesity 5. Lymphedema bilateral legs  Overall the patient has remained stable. He will continue taken hydrocodone and nortriptyline. He does not need refills at this time. Patient is reporting some weakness in the left leg after having an episode of venous stasis. I have advised the patient that if his symptoms do not improve within one to 2 weeks he should let us or Dr. Modesto Charon know. Patient verbalized understanding. He will follow-up in 6 months or sooner if needed.   Butch Penny, MSN, NP-C 04/22/2015, 9:13 AM [facility] [address] [place], [place] [address] [phone]  Note: This document was prepared with digital dictation and possible smart phrase technology. Any transcriptional errors that result from this process are unintentional.

## 2015-04-22 NOTE — Patient Instructions (Signed)
Continue Nortriptyline and Hydrocodone.  If symptoms worsen or you develop new symptoms please let us know.

## 2015-04-22 NOTE — Progress Notes (Signed)
I have read the note, and I agree with the clinical assessment and plan.  [name],CHARLES KEITH   

## 2015-05-14 ENCOUNTER — Telehealth: Payer: Self-pay | Admitting: Neurology

## 2015-05-14 DIAGNOSIS — M47817 Spondylosis without myelopathy or radiculopathy, lumbosacral region: Secondary | ICD-10-CM

## 2015-05-14 NOTE — Telephone Encounter (Signed)
Patient called and requested to speak with Dr. Anne Hahn regarding a request for an injection to relieve his back pain. Please call and advise.

## 2015-05-14 NOTE — Telephone Encounter (Signed)
I called patient. He has gotten epidural steroid injections in the low back in the past, the last one was done in January 2016. He does get transient benefit with the procedure. I'll try to get this set up for him.

## 2015-05-14 NOTE — Telephone Encounter (Signed)
I called the patient. He was recently here to see [facility] [place] [facility] 04/22/15. He states over the past 2 weeks his back pain has gotten worse. He said he has had an epidural steroid injection in the past that helped for a little while and would like to have another one. I told him I would check with Dr. Anne Hahn and call him back.

## 2015-05-15 ENCOUNTER — Other Ambulatory Visit: Payer: Self-pay | Admitting: Neurology

## 2015-05-15 DIAGNOSIS — M47817 Spondylosis without myelopathy or radiculopathy, lumbosacral region: Secondary | ICD-10-CM

## 2015-05-20 ENCOUNTER — Ambulatory Visit
Admission: RE | Admit: 2015-05-20 | Discharge: 2015-05-20 | Disposition: A | Discharge status: Home / Self Care | Payer: Medicare Other | Source: Ambulatory Visit | Attending: Neurology | Admitting: Neurology

## 2015-05-20 DIAGNOSIS — M47817 Spondylosis without myelopathy or radiculopathy, lumbosacral region: Secondary | ICD-10-CM

## 2015-05-20 MED ORDER — IOHEXOL 180 MG/ML  SOLN
1.0000 mL | Freq: Once | INTRAMUSCULAR | Status: AC | PRN
  Administered 2015-05-20: 1 mL via EPIDURAL

## 2015-05-20 MED ORDER — METHYLPREDNISOLONE ACETATE 40 MG/ML INJ SUSP (RADIOLOG
120.0000 mg | Freq: Once | INTRAMUSCULAR | Status: AC
  Administered 2015-05-20: 120 mg via EPIDURAL

## 2015-05-20 NOTE — Discharge Instructions (Signed)

## 2015-05-22 ENCOUNTER — Other Ambulatory Visit: Payer: Self-pay | Admitting: Neurology

## 2015-05-22 ENCOUNTER — Telehealth: Payer: Self-pay | Admitting: Neurology

## 2015-05-22 ENCOUNTER — Telehealth: Payer: Self-pay

## 2015-05-22 MED ORDER — HYDROCODONE-ACETAMINOPHEN 7.5-325 MG PO TABS: 1.0000 | ORAL_TABLET | Freq: Four times a day (QID) | ORAL | Status: DC | PRN

## 2015-05-22 NOTE — Telephone Encounter (Signed)
Request entered, forwarded to provider for approval.  

## 2015-05-22 NOTE — Telephone Encounter (Signed)
Pt called requesting refill on HYDROcodone-acetaminophen (NORCO) 7.5-325 MG per tablet

## 2015-05-22 NOTE — Telephone Encounter (Signed)
Rx ready for pick up. 

## 2015-06-30 ENCOUNTER — Telehealth: Payer: Self-pay | Admitting: Neurology

## 2015-06-30 MED ORDER — HYDROCODONE-ACETAMINOPHEN 7.5-325 MG PO TABS: 1.0000 | ORAL_TABLET | Freq: Four times a day (QID) | ORAL | Status: DC | PRN

## 2015-06-30 NOTE — Telephone Encounter (Signed)
Pt called and is moving to [place], [place], wants to know if Dr. Anne Hahn can refer pt to someone in [place].  Pt also called to have refill on HYDROcodone-acetaminophen (NORCO) 7.5-325 MG per tablet. Thank you

## 2015-06-30 NOTE — Telephone Encounter (Signed)
I called patient. He is not moving to [place], just into the [place]. I have written a prescription to have him come and pick up the hydrocodone.

## 2015-07-01 NOTE — Telephone Encounter (Signed)
Error

## 2015-08-11 ENCOUNTER — Telehealth: Payer: Self-pay

## 2015-08-11 ENCOUNTER — Other Ambulatory Visit: Payer: Self-pay | Admitting: Neurology

## 2015-08-11 MED ORDER — HYDROCODONE-ACETAMINOPHEN 7.5-325 MG PO TABS: 1.0000 | ORAL_TABLET | Freq: Four times a day (QID) | ORAL | Status: DC | PRN

## 2015-08-11 NOTE — Telephone Encounter (Signed)
Rx ready for pick up. 

## 2015-08-11 NOTE — Telephone Encounter (Signed)
Patient called to request refill of HYDROcodone-acetaminophen (NORCO) 7.5-325 MG per tablet

## 2015-08-12 ENCOUNTER — Other Ambulatory Visit: Payer: Self-pay | Admitting: Neurology

## 2015-09-12 ENCOUNTER — Other Ambulatory Visit: Payer: Self-pay | Admitting: Neurology

## 2015-09-18 ENCOUNTER — Other Ambulatory Visit: Payer: Self-pay | Admitting: Neurology

## 2015-09-18 MED ORDER — HYDROCODONE-ACETAMINOPHEN 7.5-325 MG PO TABS: 1.0000 | ORAL_TABLET | Freq: Four times a day (QID) | ORAL | Status: DC | PRN

## 2015-09-18 NOTE — Telephone Encounter (Signed)
Pt called and needs refill on HYDROcodone-acetaminophen (NORCO) 7.5-325 MG tablet.  Thank you

## 2015-09-18 NOTE — Telephone Encounter (Signed)
Called patient and informed Rx ready for pick up at front desk. Patient verbalized understanding.  

## 2015-10-23 ENCOUNTER — Encounter: Payer: Self-pay | Admitting: Adult Health

## 2015-10-23 ENCOUNTER — Ambulatory Visit (INDEPENDENT_AMBULATORY_CARE_PROVIDER_SITE_OTHER): Payer: Medicare Other | Admitting: Adult Health

## 2015-10-23 VITALS — BP 128/78 | HR 90 | Ht 73.0 in | Wt 356.0 lb

## 2015-10-23 DIAGNOSIS — G8929 Other chronic pain: Secondary | ICD-10-CM

## 2015-10-23 DIAGNOSIS — G609 Hereditary and idiopathic neuropathy, unspecified: Secondary | ICD-10-CM

## 2015-10-23 DIAGNOSIS — M549 Dorsalgia, unspecified: Secondary | ICD-10-CM | POA: Diagnosis not present

## 2015-10-23 MED ORDER — HYDROCODONE-ACETAMINOPHEN 7.5-325 MG PO TABS: 1.0000 | ORAL_TABLET | Freq: Four times a day (QID) | ORAL | Status: DC | PRN

## 2015-10-23 NOTE — Progress Notes (Signed)
I have read the note, and I agree with the clinical assessment and plan.  [name],[name]   

## 2015-10-23 NOTE — Progress Notes (Signed)
PATIENT: [name] DOB: Feb 28, 1956  REASON FOR VISIT: follow up-  chronic low back pain, peripheral neuropathy  HISTORY FROM: patient  HISTORY OF PRESENT ILLNESS:  [name] is a 60 year old male with a history of obesity, back pain, peripheral neuropathy, lymphedema of the legs and bilateral meralgia paresthetica. He returns today for follow-up. The patient is currently taking nortriptyline 30 mg at bedtime as well as hydrocodone as needed for pain. The patient states that his pain is controlled with the hydrocodone. He states that he has noticed that weather is a tremor that makes his discomfort worse. He uses a cane when ambulating. The patient did have a fall when he was in his chart however he did not suffer any injuries. The patient continues to have issues with venous stasis in the lower leg He denies any new neurological symptoms. He returns today for an evaluation.   HISTORY 04/22/15: [name] is a 39 -year-old male with a history of obesity, chronic low back pain, peripheral neuropathy, lymphedema of the legs and bilateral meralgia paresthetica. He returns today for follow-up. The patient continues to take nortriptyline 30 mg at bedtime and hydrocodone as needed. He states that these medications are working well for him. He states that the hydrocodone especially helps with his back pain that occurs at bedtime. Patient states that he just recently had an episode of "venous stasis" on the left lower leg. He states that it took him approximately 8 days to recover from this. He states that since then he just feels weaker on the left leg. The patient continues to have numbness in both legs but feels that the left side is worse. Denies any tingling sensations. Patient denies any other neurological symptoms. He returns today for an evaluation   REVIEW OF SYSTEMS: Out of a complete 14 system review of symptoms, the patient complains only of the following symptoms, and all other reviewed systems are  negative.  Cold intolerance, rectal bleeding, constipation, restless leg, apnea, leg swelling, ringing in ears, numbness, weakness   ALLERGIES: No Known Allergies  HOME MEDICATIONS: Outpatient Prescriptions Prior to Visit  Medication Sig Dispense Refill  . ammonium lactate (AMLACTIN) 12 % cream Apply 1 application topically 2 (two) times daily as needed for dry skin.     Marland Kitchen aspirin 81 MG tablet Take 81 mg by mouth daily.    Marland Kitchen atorvastatin (LIPITOR) 20 MG tablet     . cephALEXin (KEFLEX) 500 MG capsule Take 1 capsule (500 mg total) by mouth 4 (four) times daily. 40 capsule 0  . diclofenac (VOLTAREN) 75 MG EC tablet TAKE 1 TABLET BY MOUTH 2  TIMES DAILY WITH A MEAL 180 tablet 1  . furosemide (LASIX) 40 MG tablet Take 40 mg by mouth 2 (two) times daily.    Marland Kitchen gabapentin (NEURONTIN) 300 MG capsule Take 300 mg by mouth 3 (three) times daily.     Marland Kitchen HYDROcodone-acetaminophen (NORCO) 7.5-325 MG tablet Take 1 tablet by mouth every 6 (six) hours as needed for moderate pain (Must last 28 days). 40 tablet 0  . nortriptyline (PAMELOR) 10 MG capsule TAKE 3 CAPSULES (30 MG TOTAL) BY MOUTH EVERY EVENING. 270 capsule 1  . ramipril (ALTACE) 10 MG capsule Take 10 mg by mouth 2 (two) times daily.    Marland Kitchen sulfamethoxazole-trimethoprim (BACTRIM DS) 800-160 MG per tablet Take 1 tablet by mouth 2 (two) times daily. 20 tablet 0  . tiZANidine (ZANAFLEX) 4 MG tablet Take 1 tablet by mouth 3  times daily 270 tablet 0  . traZODone (DESYREL) 50 MG tablet Take 1 tablet by mouth at bedtime.    . triamcinolone cream (KENALOG) 0.1 % Apply 1 application topically daily as needed. For rash     No facility-administered medications prior to visit.    PAST MEDICAL HISTORY: Past Medical History  Diagnosis Date  . Peripheral neuropathy (HCC)   . Obesity   . Obstructive sleep apnea on CPAP   . Hydrocephalus   . History of headache   . Peripheral edema     Chronic venous stasis  . Gait disorder   . Hypertension   . History  of melanoma   . Chronic insomnia   . Chronic low back pain   . Pancreatitis     Secondary to gallstones  . Pancreatitis due to biliary obstruction   . Cataracts, bilateral     PAST SURGICAL HISTORY: Past Surgical History  Procedure Laterality Date  . Lumbar spine surgery    . Carpal tunnel release Bilateral   . Inguinal hernia repair    . Cholecystectomy    . Tonsillectomy      FAMILY HISTORY: Family History  Problem Relation Age of Onset  . Cancer Mother     Liver cancer  . Cancer Father     brain cancer  . Cancer Maternal Grandmother     Liver cancer  . Heart attack Maternal Grandfather     SOCIAL HISTORY: Social History   Social History  . Marital Status: Married    Spouse Name: [name]  . Number of Children: 2  . Years of Education: 16   Occupational History  .      Employed by school system   Social History Main Topics  . Smoking status: Never Smoker   . Smokeless tobacco: Never Used  . Alcohol Use: 0.0 oz/week    0 Standard drinks or equivalent per week     Comment: Consumes alcohol on occasion  . Drug Use: No  . Sexual Activity: Not on file   Other Topics Concern  . Not on file   Social History Narrative   Patient lives at home with his wife [name]).   Patient is retired/disabled.   Education college.   Both hands.   Caffeine four cups of coffee daily.      PHYSICAL EXAM  Filed Vitals:   10/23/15 0852  BP: 128/78  Pulse: 90  Height: 6\' 1"  (1.854 m)  Weight: 356 lb (161.481 kg)   Body mass index is 46.98 kg/(m^2).  Generalized: Well developed, in no acute distress   Neurological examination  Mentation: Alert oriented to time, place, history taking. Follows all commands speech and language fluent Cranial nerve II-XII: Pupils were equal round reactive to light. Extraocular movements were full, visual field were full on confrontational test. Facial sensation and strength were normal. Uvula tongue midline. Head turning and shoulder  shrug  were normal and symmetric. Motor: The motor testing reveals 5 over 5 strength of all 4 extremities- some giveaway weakness noted in the left lower extremity.Peri Jefferson symmetric motor tone is noted throughout.  Sensory: Sensory testing is intact to soft touch on all 4 extremities. No evidence of extinction is noted.  Coordination: Cerebellar testing reveals good finger-nose-finger and heel-to-shin bilaterally.  Gait and station: Patient uses a cane when ambulating. Gait is slightly unsteady. Tandem gait not attempted. Reflexes: Deep tendon reflexes are symmetric but depressed throughout   DIAGNOSTIC DATA (LABS, IMAGING, TESTING) - I reviewed patient  records, labs, notes, testing and imaging myself where available.  ASSESSMENT AND PLAN 60 y.o. year old male  has a past medical history of Peripheral neuropathy (HCC); Obesity; Obstructive sleep apnea on CPAP; Hydrocephalus; History of headache; Peripheral edema; Gait disorder; Hypertension; History of melanoma; Chronic insomnia; Chronic low back pain; Pancreatitis; Pancreatitis due to biliary obstruction; and Cataracts, bilateral. here with:  1. Chronic low back pain 2. Peripheral neuropathy  Overall the patient has remained stable. He will continue using nortriptyline and hydrocodone for his discomfort. He received a refill of hydrocodone today. Patient advised that if his symptoms worsen or he develops any new symptoms he should let us know. He will follow-up in 6 months with Dr. Nechama GuardWillis   Amani Marseille, MSN, NP-C 10/23/2015, 9:12 AM [facility] [address] [place], [place] [address] [phone]

## 2015-10-23 NOTE — Patient Instructions (Signed)
Continue Nortriptyline and Hydrocodone If your symptoms worsen or you develop new symptoms please let us know.

## 2015-12-08 ENCOUNTER — Telehealth: Payer: Self-pay | Admitting: Neurology

## 2015-12-08 MED ORDER — HYDROCODONE-ACETAMINOPHEN 7.5-325 MG PO TABS: 1.0000 | ORAL_TABLET | Freq: Four times a day (QID) | ORAL | Status: DC | PRN

## 2015-12-08 NOTE — Telephone Encounter (Signed)
Pt needs refill on HYDROcodone-acetaminophen (NORCO) 7.5-325 MG tablet . Thank you °

## 2015-12-08 NOTE — Addendum Note (Signed)
Addended by: Stephanie Acre on: 12/08/2015 05:12 PM   Modules accepted: Orders

## 2015-12-08 NOTE — Telephone Encounter (Signed)
The patient is due for her prescription for hydrocodone.

## 2015-12-09 ENCOUNTER — Telehealth: Payer: Self-pay

## 2015-12-09 NOTE — Telephone Encounter (Signed)
Rx ready for pick up. 

## 2015-12-18 ENCOUNTER — Other Ambulatory Visit: Payer: Self-pay | Admitting: Neurology

## 2015-12-19 ENCOUNTER — Other Ambulatory Visit: Payer: Self-pay | Admitting: *Deleted

## 2015-12-19 MED ORDER — TIZANIDINE HCL 4 MG PO TABS: ORAL_TABLET | ORAL | Status: DC

## 2016-01-21 ENCOUNTER — Telehealth: Payer: Self-pay | Admitting: Neurology

## 2016-01-21 ENCOUNTER — Other Ambulatory Visit: Payer: Self-pay | Admitting: Neurology

## 2016-01-21 MED ORDER — HYDROCODONE-ACETAMINOPHEN 7.5-325 MG PO TABS: 1.0000 | ORAL_TABLET | Freq: Four times a day (QID) | ORAL | Status: DC | PRN

## 2016-01-21 NOTE — Telephone Encounter (Signed)
Pt request refill for HYDROcodone-acetaminophen (NORCO) 7.5-325 MG tablet . Pt is out of medication

## 2016-01-21 NOTE — Telephone Encounter (Signed)
Last refill 12/08/15 - appts up-to-date.

## 2016-01-21 NOTE — Telephone Encounter (Signed)
Spoke to pt and he will pick up.

## 2016-01-21 NOTE — Telephone Encounter (Signed)
Patient called to request refill of HYDROcodone-acetaminophen (NORCO) 7.5-325 MG tablet °

## 2016-02-05 ENCOUNTER — Other Ambulatory Visit: Payer: Self-pay | Admitting: Neurology

## 2016-02-25 ENCOUNTER — Other Ambulatory Visit: Payer: Self-pay | Admitting: Neurology

## 2016-02-25 MED ORDER — HYDROCODONE-ACETAMINOPHEN 7.5-325 MG PO TABS: 1.0000 | ORAL_TABLET | Freq: Four times a day (QID) | ORAL | Status: DC | PRN

## 2016-02-25 NOTE — Telephone Encounter (Signed)
Last OV was 10/23/15 w/ f/u scheduled 04/21/16. Last refill 01/21/16.

## 2016-02-25 NOTE — Telephone Encounter (Signed)
Patient requesting refill of HYDROcodone-acetaminophen (NORCO) 7.5-325 MG tablet. ° ° °

## 2016-02-26 NOTE — Telephone Encounter (Signed)
Rx printed, signed, up front for pick-up. 

## 2016-02-27 ENCOUNTER — Other Ambulatory Visit: Payer: Self-pay

## 2016-02-27 NOTE — Telephone Encounter (Signed)
Received faxed request from [facility] for gabapentin refill. Patient last saw Megan NP on 10/23/15 for OV w/ recommendations to continue nortriptyline and hydrocodone. Has f/u scheduled 04/21/16 w/ Dr. Anne HahnWillis.

## 2016-02-29 MED ORDER — GABAPENTIN 300 MG PO CAPS
300.0000 mg | ORAL_CAPSULE | Freq: Three times a day (TID) | ORAL | Status: AC
Start: 2016-02-29 — End: ?

## 2016-04-05 ENCOUNTER — Other Ambulatory Visit: Payer: Self-pay | Admitting: Neurology

## 2016-04-05 MED ORDER — HYDROCODONE-ACETAMINOPHEN 7.5-325 MG PO TABS: 1.0000 | ORAL_TABLET | Freq: Four times a day (QID) | ORAL | Status: DC | PRN

## 2016-04-05 NOTE — Telephone Encounter (Signed)
Patient requesting refill of HYDROcodone-acetaminophen (NORCO) 7.5-325 MG tablet Pharmacy: Pick up

## 2016-04-05 NOTE — Telephone Encounter (Signed)
Last OV 10/23/15 w/ Megan NP for back pain and neuropathy.  F/u scheduled 04/21/16. Last filled 02/25/16.

## 2016-04-06 NOTE — Telephone Encounter (Signed)
Rx printed, signed, up front for pick-up. 

## 2016-04-21 ENCOUNTER — Ambulatory Visit (INDEPENDENT_AMBULATORY_CARE_PROVIDER_SITE_OTHER): Payer: Medicare Other | Admitting: Neurology

## 2016-04-21 ENCOUNTER — Encounter: Payer: Self-pay | Admitting: Neurology

## 2016-04-21 VITALS — BP 140/80 | HR 80 | Ht 73.0 in | Wt 365.0 lb

## 2016-04-21 DIAGNOSIS — G8929 Other chronic pain: Secondary | ICD-10-CM

## 2016-04-21 DIAGNOSIS — G63 Polyneuropathy in diseases classified elsewhere: Secondary | ICD-10-CM

## 2016-04-21 DIAGNOSIS — R269 Unspecified abnormalities of gait and mobility: Secondary | ICD-10-CM

## 2016-04-21 DIAGNOSIS — M545 Low back pain, unspecified: Secondary | ICD-10-CM

## 2016-04-21 DIAGNOSIS — G5710 Meralgia paresthetica, unspecified lower limb: Secondary | ICD-10-CM | POA: Diagnosis not present

## 2016-04-21 MED ORDER — TIZANIDINE HCL 4 MG PO TABS: ORAL_TABLET | ORAL | Status: DC

## 2016-04-21 MED ORDER — HYDROCODONE-ACETAMINOPHEN 7.5-325 MG PO TABS: 1.0000 | ORAL_TABLET | Freq: Four times a day (QID) | ORAL | Status: DC | PRN

## 2016-04-21 NOTE — Progress Notes (Signed)
Reason for visit: Low back pain, peripheral neuropathy  [name] is an 60 y.o. male  History of present illness:  Mr. [name] is a 60 year old right-handed white male with a history of chronic low back pain and pain down both legs. The patient has had some issues with neuropathy as well, he is on gabapentin, he takes tizanidine for the back, and occasionally he will take some hydrocodone. The patient is managing fairly well, he last had an epidural steroid injection in the low back in August 2016 which has benefited him since that time, the pain is now coming back. The patient has not had any recent falls, he may stumble on occasion, he uses a cane for ambulation. He indicates that he sleeps fairly well at night. He has significant swelling in both legs. This is a chronic issue.  Past Medical History  Diagnosis Date  . Peripheral neuropathy (HCC)   . Obesity   . Obstructive sleep apnea on CPAP   . Hydrocephalus   . History of headache   . Peripheral edema     Chronic venous stasis  . Gait disorder   . Hypertension   . History of melanoma   . Chronic insomnia   . Chronic low back pain   . Pancreatitis     Secondary to gallstones  . Pancreatitis due to biliary obstruction   . Cataracts, bilateral     Past Surgical History  Procedure Laterality Date  . Lumbar spine surgery    . Carpal tunnel release Bilateral   . Inguinal hernia repair    . Cholecystectomy    . Tonsillectomy      Family History  Problem Relation Age of Onset  . Cancer Mother     Liver cancer  . Cancer Father     brain cancer  . Cancer Maternal Grandmother     Liver cancer  . Heart attack Maternal Grandfather     Social history:  reports that he has never smoked. He has never used smokeless tobacco. He reports that he drinks alcohol. He reports that he does not use illicit drugs.   No Known Allergies  Medications:  Prior to Admission medications   Medication Sig Start Date End Date Taking?  Authorizing Provider  ammonium lactate (AMLACTIN) 12 % cream Apply 1 application topically 2 (two) times daily as needed for dry skin.  03/19/14  Yes Historical Provider, MD  aspirin 81 MG tablet Take 81 mg by mouth daily.   Yes Historical Provider, MD  atorvastatin (LIPITOR) 20 MG tablet  10/13/14  Yes Historical Provider, MD  cephALEXin (KEFLEX) 500 MG capsule Take 1 capsule (500 mg total) by mouth 4 (four) times daily. 06/14/14  Yes Gilda Creasehristopher J Pollina, MD  diclofenac (VOLTAREN) 75 MG EC tablet TAKE 1 TABLET BY MOUTH 2  TIMES DAILY WITH A MEAL 02/05/16  Yes York Spanielharles K Renner Sebald, MD  furosemide (LASIX) 40 MG tablet Take 40 mg by mouth 2 (two) times daily.   Yes Historical Provider, MD  gabapentin (NEURONTIN) 300 MG capsule Take 1 capsule (300 mg total) by mouth 3 (three) times daily. 02/29/16  Yes York Spanielharles K Jenea Dake, MD  HYDROcodone-acetaminophen (NORCO) 7.5-325 MG tablet Take 1 tablet by mouth every 6 (six) hours as needed for moderate pain (Must last 28 days). 04/05/16  Yes York Spanielharles K Terria Deschepper, MD  nortriptyline (PAMELOR) 10 MG capsule TAKE 3 CAPSULES (30 MG TOTAL) BY MOUTH EVERY EVENING. 04/11/15  Yes York Spanielharles K Yash Cacciola, MD  ramipril (ALTACE)  10 MG capsule Take 10 mg by mouth 2 (two) times daily.   Yes Historical Provider, MD  sulfamethoxazole-trimethoprim (BACTRIM DS) 800-160 MG per tablet Take 1 tablet by mouth 2 (two) times daily. 06/14/14  Yes Gilda Creasehristopher J Pollina, MD  tiZANidine (ZANAFLEX) 4 MG tablet Take 1 tablet by mouth 3  times daily 12/19/15  Yes York Spanielharles K Kaedance Magos, MD  traZODone (DESYREL) 50 MG tablet Take 1 tablet by mouth at bedtime. 03/19/14  Yes Historical Provider, MD  triamcinolone cream (KENALOG) 0.1 % Apply 1 application topically daily as needed. For rash   Yes Historical Provider, MD    ROS:  Out of a complete 14 system review of symptoms, the patient complains only of the following symptoms, and all other reviewed systems are negative.  Back pain, walking difficulty Weakness  Blood  pressure 140/80, pulse 80, height 6\' 1"  (1.854 m), weight 365 lb (165.563 kg).  Physical Exam  General: The patient is alert and cooperative at the time of the examination. The patient is markedly obese.  Skin: 4+ edema in the legs is noted bilaterally.   Neurologic Exam  Mental status: The patient is alert and oriented x 3 at the time of the examination. The patient has apparent normal recent and remote memory, with an apparently normal attention span and concentration ability.   Cranial nerves: Facial symmetry is present. Speech is normal, no aphasia or dysarthria is noted. Extraocular movements are full. Visual fields are full.  Motor: The patient has good strength in all 4 extremities.  Sensory examination: Soft touch sensation is symmetric on the face, arms, and legs.  Coordination: The patient has good finger-nose-finger and heel-to-shin bilaterally.  Gait and station: The patient has a wide-based, slightly unsteady gait, the patient uses a cane for ambulation. Tandem gait was not attempted. Romberg is negative.  Reflexes: Deep tendon reflexes are symmetric, but are depressed.   Assessment/Plan:  1. Chronic low back pain, leg pain  2. Peripheral neuropathy  3. Gait disturbance  The patient will be set up for an epidural steroid injection again, the last injection seemed to help. He was given a prescription for his tizanidine and for hydrocodone. He will follow-up through this office in 6 months, sooner if needed.  [name]. Warren Amany Rando MD 04/21/2016 4:36 PM  [facility] [address] [place], [place] [id number]  Phone [phone] Fax [phone]

## 2016-04-23 ENCOUNTER — Other Ambulatory Visit: Payer: Self-pay | Admitting: Neurology

## 2016-04-23 DIAGNOSIS — G8929 Other chronic pain: Secondary | ICD-10-CM

## 2016-04-23 DIAGNOSIS — M545 Low back pain: Principal | ICD-10-CM

## 2016-05-03 ENCOUNTER — Other Ambulatory Visit: Payer: Medicare Other

## 2016-05-11 ENCOUNTER — Ambulatory Visit
Admission: RE | Admit: 2016-05-11 | Discharge: 2016-05-11 | Disposition: A | Discharge status: Home / Self Care | Payer: Medicare Other | Source: Ambulatory Visit | Attending: Neurology | Admitting: Neurology

## 2016-05-11 DIAGNOSIS — G8929 Other chronic pain: Secondary | ICD-10-CM

## 2016-05-11 DIAGNOSIS — M545 Low back pain: Principal | ICD-10-CM

## 2016-05-11 MED ORDER — IOPAMIDOL (ISOVUE-M 200) INJECTION 41%
1.0000 mL | Freq: Once | INTRAMUSCULAR | Status: AC
  Administered 2016-05-11: 1 mL via EPIDURAL

## 2016-05-11 MED ORDER — METHYLPREDNISOLONE ACETATE 40 MG/ML INJ SUSP (RADIOLOG
120.0000 mg | Freq: Once | INTRAMUSCULAR | Status: AC
  Administered 2016-05-11: 120 mg via EPIDURAL

## 2016-05-11 NOTE — Discharge Instructions (Signed)

## 2016-06-18 ENCOUNTER — Other Ambulatory Visit: Payer: Self-pay | Admitting: Neurology

## 2016-06-18 MED ORDER — HYDROCODONE-ACETAMINOPHEN 7.5-325 MG PO TABS: 1.0000 | ORAL_TABLET | Freq: Four times a day (QID) | ORAL | 0 refills | Status: DC | PRN

## 2016-06-18 NOTE — Telephone Encounter (Signed)
Pt's last OV was on 04/21/16 and med was refilled at that time. He has a follow-up appt scheduled w/ Megan NP in January.

## 2016-06-18 NOTE — Telephone Encounter (Signed)
Rx printed, signed, up front for pick-up. 

## 2016-06-18 NOTE — Telephone Encounter (Signed)
Pt called request refill for HYDROcodone-acetaminophen (NORCO) 7.5-325 MG tablet

## 2016-07-23 ENCOUNTER — Other Ambulatory Visit: Payer: Self-pay | Admitting: Neurology

## 2016-07-23 MED ORDER — HYDROCODONE-ACETAMINOPHEN 7.5-325 MG PO TABS: 1.0000 | ORAL_TABLET | Freq: Four times a day (QID) | ORAL | 0 refills | Status: DC | PRN

## 2016-07-23 NOTE — Telephone Encounter (Signed)
Patient called to request refill of HYDROcodone-acetaminophen (NORCO) 7.5-325 MG tablet °

## 2016-07-23 NOTE — Telephone Encounter (Signed)
Rx printed, signed, up front for pick-up. 

## 2016-07-23 NOTE — Telephone Encounter (Signed)
Pt was last seen in July and has f/u scheduled in January. Last rx written 06/18/16.

## 2016-09-01 ENCOUNTER — Other Ambulatory Visit: Payer: Self-pay | Admitting: Neurology

## 2016-09-01 MED ORDER — HYDROCODONE-ACETAMINOPHEN 7.5-325 MG PO TABS: 1.0000 | ORAL_TABLET | Freq: Four times a day (QID) | ORAL | 0 refills | Status: DC | PRN

## 2016-09-01 NOTE — Telephone Encounter (Signed)
Rx printed, signed, up front for pick-up. 

## 2016-09-01 NOTE — Telephone Encounter (Signed)
Patient requesting refill of HYDROcodone-acetaminophen (NORCO) 7.5-325 MG tablet °Pharmacy: pick up ° °

## 2016-09-01 NOTE — Telephone Encounter (Signed)
Pt was seen in July and has a follow-up in January. Last rx written 07/23/16.

## 2016-10-25 ENCOUNTER — Ambulatory Visit (INDEPENDENT_AMBULATORY_CARE_PROVIDER_SITE_OTHER): Payer: Medicare Other | Admitting: Adult Health

## 2016-10-25 ENCOUNTER — Encounter: Payer: Self-pay | Admitting: Adult Health

## 2016-10-25 VITALS — BP 122/86 | HR 88 | Ht 73.0 in | Wt 347.8 lb

## 2016-10-25 DIAGNOSIS — M545 Low back pain, unspecified: Secondary | ICD-10-CM

## 2016-10-25 DIAGNOSIS — G8929 Other chronic pain: Secondary | ICD-10-CM | POA: Diagnosis not present

## 2016-10-25 MED ORDER — HYDROCODONE-ACETAMINOPHEN 7.5-325 MG PO TABS: 1.0000 | ORAL_TABLET | Freq: Four times a day (QID) | ORAL | 0 refills | Status: DC | PRN

## 2016-10-25 NOTE — Patient Instructions (Signed)
Continue Hydrocodone, tizandine, nortriptyline If your symptoms worsen or you develop new symptoms please let us know.

## 2016-10-25 NOTE — Progress Notes (Signed)
PATIENT: [name] DOB: 12/02/1955  REASON FOR VISIT: follow up- low back pain HISTORY FROM: patient  HISTORY OF PRESENT ILLNESS: [name] is a 61 year old male with a history of chronic low back pain and pain down both legs. He returns today for an evaluation. At the last visit he was scheduled for an epidural steroid injection. He reports that the injection was beneficial. He continues to use tizanidine for his back pain. He reports that on occasion he will have to use hydrocodone for severe pain. He reports that he usually uses this at night. Denies any changes with his gait or balance. He reports that he did have 2 falls in the last month. He states that both falls occurred when he was walking into his home. Denies any injuries. The patient reports that he lost his wife in December to colon cancer. He states that he is still dealing with the grief. He is seeing a counselor to help him work through his grief. He reports that he will not be able to have epidural steroid injections as he does not have transportation. He reports for now he will just continue using medication. He does have a son and daughter. However he reports that with their work schedule they are not always readily available. He reports that they do call and check on him frequently. He denies any thoughts of harming himself. He does feel that he is slightly depressed however this is due to his grief. He returns today for an evaluation   HISTORY 04/21/16 [name]([name]): [name] is a 61 year old right-handed white male with a history of chronic low back pain and pain down both legs. The patient has had some issues with neuropathy as well, he is on gabapentin, he takes tizanidine for the back, and occasionally he will take some hydrocodone. The patient is managing fairly well, he last had an epidural steroid injection in the low back in August 2016 which has benefited him since that time, the pain is now coming back. The patient has not had  any recent falls, he may stumble on occasion, he uses a cane for ambulation. He indicates that he sleeps fairly well at night. He has significant swelling in both legs. This is a chronic issue.  REVIEW OF SYSTEMS: Out of a complete 14 system review of symptoms, the patient complains only of the following symptoms, and all other reviewed systems are negative.  Numbness, weakness  ALLERGIES: No Known Allergies  HOME MEDICATIONS: Outpatient Medications Prior to Visit  Medication Sig Dispense Refill  . ammonium lactate (AMLACTIN) 12 % cream Apply 1 application topically 2 (two) times daily as needed for dry skin.     Marland Kitchen. aspirin 81 MG tablet Take 81 mg by mouth daily.    Marland Kitchen. atorvastatin (LIPITOR) 20 MG tablet     . cephALEXin (KEFLEX) 500 MG capsule Take 1 capsule (500 mg total) by mouth 4 (four) times daily. 40 capsule 0  . diclofenac (VOLTAREN) 75 MG EC tablet TAKE 1 TABLET BY MOUTH 2  TIMES DAILY WITH A MEAL 180 tablet 1  . furosemide (LASIX) 40 MG tablet Take 40 mg by mouth 2 (two) times daily.    Marland Kitchen. gabapentin (NEURONTIN) 300 MG capsule Take 1 capsule (300 mg total) by mouth 3 (three) times daily. 270 capsule 3  . HYDROcodone-acetaminophen (NORCO) 7.5-325 MG tablet Take 1 tablet by mouth every 6 (six) hours as needed for moderate pain (Must last 28 days). 40 tablet 0  .  nortriptyline (PAMELOR) 10 MG capsule TAKE 3 CAPSULES (30 MG TOTAL) BY MOUTH EVERY EVENING. 270 capsule 1  . ramipril (ALTACE) 10 MG capsule Take 10 mg by mouth 2 (two) times daily.    Marland Kitchen sulfamethoxazole-trimethoprim (BACTRIM DS) 800-160 MG per tablet Take 1 tablet by mouth 2 (two) times daily. 20 tablet 0  . tiZANidine (ZANAFLEX) 4 MG tablet Take 1 tablet by mouth 3  times daily 270 tablet 3  . traZODone (DESYREL) 50 MG tablet Take 1 tablet by mouth at bedtime.    . triamcinolone cream (KENALOG) 0.1 % Apply 1 application topically daily as needed. For rash     No facility-administered medications prior to visit.     PAST  MEDICAL HISTORY: Past Medical History:  Diagnosis Date  . Cataracts, bilateral   . Chronic insomnia   . Chronic low back pain   . Gait disorder   . History of headache   . History of melanoma   . Hydrocephalus   . Hypertension   . Obesity   . Obstructive sleep apnea on CPAP   . Pancreatitis    Secondary to gallstones  . Pancreatitis due to biliary obstruction   . Peripheral edema    Chronic venous stasis  . Peripheral neuropathy (HCC)     PAST SURGICAL HISTORY: Past Surgical History:  Procedure Laterality Date  . CARPAL TUNNEL RELEASE Bilateral   . CHOLECYSTECTOMY    . INGUINAL HERNIA REPAIR    . LUMBAR SPINE SURGERY    . TONSILLECTOMY      FAMILY HISTORY: Family History  Problem Relation Age of Onset  . Cancer Mother     Liver cancer  . Cancer Father     brain cancer  . Cancer Maternal Grandmother     Liver cancer  . Heart attack Maternal Grandfather     SOCIAL HISTORY: Social History   Social History  . Marital status: Married    Spouse name: Steward Drone  . Number of children: 2  . Years of education: 16   Occupational History  .      Employed by school system   Social History Main Topics  . Smoking status: Never Smoker  . Smokeless tobacco: Never Used  . Alcohol use 0.0 oz/week     Comment: Consumes alcohol on occasion  . Drug use: No  . Sexual activity: Not on file   Other Topics Concern  . Not on file   Social History Narrative   Patient lives at home with his wife Steward Drone).   Patient is retired/disabled.   Education college.   Both hands.   Caffeine four cups of coffee daily.      PHYSICAL EXAM  Vitals:   10/25/16 0943  BP: 122/86  Pulse: 88  Weight: (!) 347 lb 12.8 oz (157.8 kg)  Height: 6\' 1"  (1.854 m)   Body mass index is 45.89 kg/m.  Generalized: Well developed, in no acute distress, obese   Neurological examination  Mentation: Alert oriented to time, place, history taking. Follows all commands speech and language  fluent Cranial nerve II-XII: Pupils were equal round reactive to light. Extraocular movements were full, visual field were full on confrontational test. Facial sensation and strength were normal. Uvula tongue midline. Head turning and shoulder shrug  were normal and symmetric. Motor: The motor testing reveals 5 over 5 strength of all 4 extremities. Good symmetric motor tone is noted throughout.  Sensory: Sensory testing is intact to soft touch on all 4 extremities. No  evidence of extinction is noted.  Coordination: Cerebellar testing reveals good finger-nose-finger and heel-to-shin bilaterally.  Gait and station: Patient uses a cane when ambulating.   DIAGNOSTIC DATA (LABS, IMAGING, TESTING) - I reviewed patient records, labs, notes, testing and imaging myself where available.     ASSESSMENT AND PLAN 61 y.o. year old male  has a past medical history of Cataracts, bilateral; Chronic insomnia; Chronic low back pain; Gait disorder; History of headache; History of melanoma; Hydrocephalus; Hypertension; Obesity; Obstructive sleep apnea on CPAP; Pancreatitis; Pancreatitis due to biliary obstruction; Peripheral edema; and Peripheral neuropathy (HCC). here with:  1. Chronic low back pain  The patient reports that his pain has been under relatively good control with tizanidine and hydrocodone. He is also on nortriptyline. We did discuss the risk associated with taking these medications together and potential side effects. Also advised that if his depression worsens or of he has thoughts of harming himself he should call 911 or go to the emergency room. His depression will need to be closely monitored as he is on opioid therapy. I did check the [place] drug registry and the patient is not on any other opioid medication. He will follow-up in 6 months or sooner if needed.    Butch Penny, MSN, NP-C 10/25/2016, 9:48 AM [facility] [address], [address] [place], [place] [address] [phone]

## 2016-10-25 NOTE — Progress Notes (Signed)
I have read the note, and I agree with the clinical assessment and plan.  [name],CHARLES KEITH   

## 2016-12-07 ENCOUNTER — Telehealth: Payer: Self-pay | Admitting: Neurology

## 2016-12-07 MED ORDER — HYDROCODONE-ACETAMINOPHEN 7.5-325 MG PO TABS: 1.0000 | ORAL_TABLET | Freq: Four times a day (QID) | ORAL | 0 refills | Status: DC | PRN

## 2016-12-07 NOTE — Telephone Encounter (Signed)
Patient call office requesting refill for HYDROcodone-acetaminophen (NORCO) 7.5-325 MG tablet.

## 2016-12-07 NOTE — Addendum Note (Signed)
Addended by: Stephanie AcreWILLIS, Mackenzee Becvar on: 12/07/2016 05:14 PM   Modules accepted: Orders

## 2016-12-07 NOTE — Telephone Encounter (Signed)
Dr Anne HahnWillis- ok to refill?  Pt last seen 10/25/16 with MM,NP. Next follow up with MM,NP on 03/28/17.  Last received rx on 10/25/16, qty 40, no refills

## 2016-12-07 NOTE — Telephone Encounter (Signed)
The hydrocodone will be refilled. 

## 2016-12-08 NOTE — Telephone Encounter (Signed)
Called and spoke to pt. Advised rx up front for pick up. He verbalized understanding.

## 2017-01-12 ENCOUNTER — Telehealth: Payer: Self-pay | Admitting: Neurology

## 2017-01-12 MED ORDER — HYDROCODONE-ACETAMINOPHEN 7.5-325 MG PO TABS: 1.0000 | ORAL_TABLET | Freq: Four times a day (QID) | ORAL | 0 refills | Status: DC | PRN

## 2017-01-12 NOTE — Telephone Encounter (Signed)
Patient called office requesting refill for HYDROcodone-acetaminophen (NORCO) 7.5-325 MG tablet. °

## 2017-01-12 NOTE — Addendum Note (Signed)
Addended by: York SpanielWILLIS, Dariah Mcsorley K on: 01/12/2017 04:45 PM   Modules accepted: Orders

## 2017-01-12 NOTE — Telephone Encounter (Signed)
The [place] registry was checked, hydrocodone will be refilled. 

## 2017-01-13 NOTE — Telephone Encounter (Signed)
Printed/signed rx up front for patient pick up. 

## 2017-02-18 ENCOUNTER — Telehealth: Payer: Self-pay | Admitting: Neurology

## 2017-02-18 MED ORDER — HYDROCODONE-ACETAMINOPHEN 7.5-325 MG PO TABS: 1.0000 | ORAL_TABLET | Freq: Four times a day (QID) | ORAL | 0 refills | Status: DC | PRN

## 2017-02-18 NOTE — Telephone Encounter (Signed)
Hydrocodone will be refilled. 

## 2017-02-18 NOTE — Telephone Encounter (Signed)
Pt request refill for HYDROcodone-acetaminophen (NORCO) 7.5-325 MG tablet . Pt aware RX will be ready within 24 hr unless advised otherwise by RN. Pt is aware the clinic closes at noon today

## 2017-02-18 NOTE — Addendum Note (Signed)
Addended by: York SpanielWILLIS, CHARLES K on: 02/18/2017 01:03 PM   Modules accepted: Orders

## 2017-02-21 NOTE — Telephone Encounter (Signed)
Placed printed/signed rx up front for pick up. 

## 2017-03-21 ENCOUNTER — Emergency Department (HOSPITAL_COMMUNITY): Payer: Medicare Other

## 2017-03-21 ENCOUNTER — Encounter (HOSPITAL_COMMUNITY): Payer: Self-pay | Admitting: Emergency Medicine

## 2017-03-21 ENCOUNTER — Inpatient Hospital Stay (HOSPITAL_COMMUNITY)
Admission: EM | Admit: 2017-03-21 | Discharge: 2017-03-29 | DRG: 872 | Disposition: A | Discharge status: Home Health | Payer: Medicare Other | Attending: Internal Medicine | Admitting: Internal Medicine

## 2017-03-21 DIAGNOSIS — J209 Acute bronchitis, unspecified: Secondary | ICD-10-CM | POA: Diagnosis present

## 2017-03-21 DIAGNOSIS — L03115 Cellulitis of right lower limb: Secondary | ICD-10-CM | POA: Diagnosis present

## 2017-03-21 DIAGNOSIS — L0390 Cellulitis, unspecified: Secondary | ICD-10-CM

## 2017-03-21 DIAGNOSIS — M7989 Other specified soft tissue disorders: Secondary | ICD-10-CM

## 2017-03-21 DIAGNOSIS — R600 Localized edema: Secondary | ICD-10-CM

## 2017-03-21 DIAGNOSIS — I10 Essential (primary) hypertension: Secondary | ICD-10-CM

## 2017-03-21 DIAGNOSIS — G4700 Insomnia, unspecified: Secondary | ICD-10-CM | POA: Diagnosis present

## 2017-03-21 DIAGNOSIS — I110 Hypertensive heart disease with heart failure: Secondary | ICD-10-CM | POA: Diagnosis present

## 2017-03-21 DIAGNOSIS — L03116 Cellulitis of left lower limb: Secondary | ICD-10-CM | POA: Diagnosis present

## 2017-03-21 DIAGNOSIS — G4733 Obstructive sleep apnea (adult) (pediatric): Secondary | ICD-10-CM | POA: Diagnosis present

## 2017-03-21 DIAGNOSIS — Z7982 Long term (current) use of aspirin: Secondary | ICD-10-CM

## 2017-03-21 DIAGNOSIS — I959 Hypotension, unspecified: Secondary | ICD-10-CM | POA: Diagnosis present

## 2017-03-21 DIAGNOSIS — E861 Hypovolemia: Secondary | ICD-10-CM | POA: Diagnosis present

## 2017-03-21 DIAGNOSIS — R05 Cough: Secondary | ICD-10-CM

## 2017-03-21 DIAGNOSIS — I5032 Chronic diastolic (congestive) heart failure: Secondary | ICD-10-CM | POA: Diagnosis present

## 2017-03-21 DIAGNOSIS — E876 Hypokalemia: Secondary | ICD-10-CM | POA: Diagnosis present

## 2017-03-21 DIAGNOSIS — Z79899 Other long term (current) drug therapy: Secondary | ICD-10-CM | POA: Diagnosis not present

## 2017-03-21 DIAGNOSIS — E872 Acidosis: Secondary | ICD-10-CM | POA: Diagnosis present

## 2017-03-21 DIAGNOSIS — R52 Pain, unspecified: Secondary | ICD-10-CM

## 2017-03-21 DIAGNOSIS — N179 Acute kidney failure, unspecified: Secondary | ICD-10-CM | POA: Diagnosis present

## 2017-03-21 DIAGNOSIS — Z6841 Body Mass Index (BMI) 40.0 and over, adult: Secondary | ICD-10-CM | POA: Diagnosis not present

## 2017-03-21 DIAGNOSIS — I890 Lymphedema, not elsewhere classified: Secondary | ICD-10-CM | POA: Diagnosis present

## 2017-03-21 DIAGNOSIS — A419 Sepsis, unspecified organism: Principal | ICD-10-CM | POA: Diagnosis present

## 2017-03-21 DIAGNOSIS — N289 Disorder of kidney and ureter, unspecified: Secondary | ICD-10-CM

## 2017-03-21 DIAGNOSIS — L03119 Cellulitis of unspecified part of limb: Secondary | ICD-10-CM

## 2017-03-21 DIAGNOSIS — I878 Other specified disorders of veins: Secondary | ICD-10-CM | POA: Diagnosis present

## 2017-03-21 DIAGNOSIS — R059 Cough, unspecified: Secondary | ICD-10-CM

## 2017-03-21 LAB — CBC WITH DIFFERENTIAL/PLATELET
BASOS ABS: 0 10*3/uL (ref 0.0–0.1)
BASOS PCT: 0 %
EOS PCT: 0 %
Eosinophils Absolute: 0.1 10*3/uL (ref 0.0–0.7)
HCT: 37.4 % — ABNORMAL LOW (ref 39.0–52.0)
Hemoglobin: 12.6 g/dL — ABNORMAL LOW (ref 13.0–17.0)
Lymphocytes Relative: 7 %
Lymphs Abs: 1.3 10*3/uL (ref 0.7–4.0)
MCH: 32.3 pg (ref 26.0–34.0)
MCHC: 33.7 g/dL (ref 30.0–36.0)
MCV: 95.9 fL (ref 78.0–100.0)
MONO ABS: 1.6 10*3/uL — AB (ref 0.1–1.0)
Monocytes Relative: 8 %
Neutro Abs: 15.9 10*3/uL — ABNORMAL HIGH (ref 1.7–7.7)
Neutrophils Relative %: 85 %
PLATELETS: 200 10*3/uL (ref 150–400)
RBC: 3.9 MIL/uL — ABNORMAL LOW (ref 4.22–5.81)
RDW: 13.4 % (ref 11.5–15.5)
WBC: 18.8 10*3/uL — ABNORMAL HIGH (ref 4.0–10.5)

## 2017-03-21 LAB — COMPREHENSIVE METABOLIC PANEL
ALBUMIN: 2.8 g/dL — AB (ref 3.5–5.0)
ALK PHOS: 37 U/L — AB (ref 38–126)
ALT: 26 U/L (ref 17–63)
AST: 35 U/L (ref 15–41)
Anion gap: 9 (ref 5–15)
BUN: 30 mg/dL — AB (ref 6–20)
CALCIUM: 7.2 mg/dL — AB (ref 8.9–10.3)
CO2: 24 mmol/L (ref 22–32)
CREATININE: 1.67 mg/dL — AB (ref 0.61–1.24)
Chloride: 103 mmol/L (ref 101–111)
GFR calc Af Amer: 49 mL/min — ABNORMAL LOW (ref >60)
GFR calc non Af Amer: 42 mL/min — ABNORMAL LOW (ref >60)
GLUCOSE: 87 mg/dL (ref 65–99)
Potassium: 3.6 mmol/L (ref 3.5–5.1)
Sodium: 136 mmol/L (ref 135–145)
TOTAL PROTEIN: 6.6 g/dL (ref 6.5–8.1)
Total Bilirubin: 1.7 mg/dL — ABNORMAL HIGH (ref 0.3–1.2)

## 2017-03-21 LAB — APTT: APTT: 31 s (ref 24–36)

## 2017-03-21 LAB — URINALYSIS, ROUTINE W REFLEX MICROSCOPIC
BILIRUBIN URINE: NEGATIVE
Glucose, UA: NEGATIVE mg/dL
Ketones, ur: NEGATIVE mg/dL
LEUKOCYTES UA: NEGATIVE
NITRITE: NEGATIVE
PH: 5 (ref 5.0–8.0)
Protein, ur: 30 mg/dL — AB
SPECIFIC GRAVITY, URINE: 1.009 (ref 1.005–1.030)

## 2017-03-21 LAB — MRSA PCR SCREENING: MRSA BY PCR: NEGATIVE

## 2017-03-21 LAB — PROCALCITONIN: PROCALCITONIN: 3.67 ng/mL

## 2017-03-21 LAB — PROTIME-INR
INR: 1.42
Prothrombin Time: 17.5 seconds — ABNORMAL HIGH (ref 11.4–15.2)

## 2017-03-21 LAB — I-STAT CG4 LACTIC ACID, ED: Lactic Acid, Venous: 2.98 mmol/L (ref 0.5–1.9)

## 2017-03-21 LAB — LACTIC ACID, PLASMA
LACTIC ACID, VENOUS: 1.7 mmol/L (ref 0.5–1.9)
Lactic Acid, Venous: 1.9 mmol/L (ref 0.5–1.9)

## 2017-03-21 MED ORDER — SODIUM CHLORIDE 0.9 % IV BOLUS (SEPSIS): 1000.0000 mL | Freq: Once | INTRAVENOUS | Status: DC

## 2017-03-21 MED ORDER — VANCOMYCIN HCL 10 G IV SOLR: 1750.0000 mg | INTRAVENOUS | Status: DC

## 2017-03-21 MED ORDER — VANCOMYCIN HCL IN DEXTROSE 1-5 GM/200ML-% IV SOLN
1000.0000 mg | Freq: Once | INTRAVENOUS | Status: AC
  Administered 2017-03-21: 1000 mg via INTRAVENOUS
  Filled 2017-03-21: qty 200

## 2017-03-21 MED ORDER — SODIUM CHLORIDE 0.9% FLUSH
3.0000 mL | Freq: Two times a day (BID) | INTRAVENOUS | Status: DC
  Administered 2017-03-21 – 2017-03-29 (×9): 3 mL via INTRAVENOUS

## 2017-03-21 MED ORDER — HEPARIN SODIUM (PORCINE) 5000 UNIT/ML IJ SOLN
5000.0000 [IU] | Freq: Three times a day (TID) | INTRAMUSCULAR | Status: DC
  Administered 2017-03-21 – 2017-03-29 (×23): 5000 [IU] via SUBCUTANEOUS
  Filled 2017-03-21 (×23): qty 1

## 2017-03-21 MED ORDER — HYDROCODONE-ACETAMINOPHEN 7.5-325 MG PO TABS
1.0000 | ORAL_TABLET | Freq: Four times a day (QID) | ORAL | Status: DC | PRN
  Administered 2017-03-21 – 2017-03-29 (×15): 1 via ORAL
  Filled 2017-03-21 (×15): qty 1

## 2017-03-21 MED ORDER — TRAZODONE HCL 50 MG PO TABS
50.0000 mg | ORAL_TABLET | Freq: Every day | ORAL | Status: DC
  Administered 2017-03-21 – 2017-03-29 (×8): 50 mg via ORAL
  Filled 2017-03-21 (×8): qty 1

## 2017-03-21 MED ORDER — GABAPENTIN 300 MG PO CAPS
600.0000 mg | ORAL_CAPSULE | Freq: Three times a day (TID) | ORAL | Status: DC
  Administered 2017-03-21 – 2017-03-29 (×24): 600 mg via ORAL
  Filled 2017-03-21 (×25): qty 2

## 2017-03-21 MED ORDER — PIPERACILLIN-TAZOBACTAM 3.375 G IVPB
3.3750 g | Freq: Three times a day (TID) | INTRAVENOUS | Status: DC
  Administered 2017-03-22 – 2017-03-29 (×23): 3.375 g via INTRAVENOUS
  Filled 2017-03-21 (×24): qty 50

## 2017-03-21 MED ORDER — NORTRIPTYLINE HCL 10 MG PO CAPS
30.0000 mg | ORAL_CAPSULE | Freq: Every day | ORAL | Status: DC
  Administered 2017-03-21 – 2017-03-28 (×8): 30 mg via ORAL
  Filled 2017-03-21 (×8): qty 3

## 2017-03-21 MED ORDER — ONDANSETRON HCL 4 MG PO TABS: 4.0000 mg | ORAL_TABLET | Freq: Four times a day (QID) | ORAL | Status: DC | PRN

## 2017-03-21 MED ORDER — VANCOMYCIN HCL 10 G IV SOLR
1500.0000 mg | INTRAVENOUS | Status: DC
  Filled 2017-03-21: qty 1500

## 2017-03-21 MED ORDER — VANCOMYCIN HCL 10 G IV SOLR
1500.0000 mg | INTRAVENOUS | Status: AC
  Administered 2017-03-21: 1500 mg via INTRAVENOUS
  Filled 2017-03-21 (×2): qty 1500

## 2017-03-21 MED ORDER — ACETAMINOPHEN 325 MG PO TABS
650.0000 mg | ORAL_TABLET | Freq: Four times a day (QID) | ORAL | Status: DC | PRN
  Administered 2017-03-29: 650 mg via ORAL
  Filled 2017-03-21: qty 2

## 2017-03-21 MED ORDER — ACETAMINOPHEN 650 MG RE SUPP: 650.0000 mg | Freq: Four times a day (QID) | RECTAL | Status: DC | PRN

## 2017-03-21 MED ORDER — SODIUM CHLORIDE 0.9 % IV SOLN: INTRAVENOUS | Status: DC

## 2017-03-21 MED ORDER — ATORVASTATIN CALCIUM 20 MG PO TABS
20.0000 mg | ORAL_TABLET | Freq: Every day | ORAL | Status: DC
  Administered 2017-03-22 – 2017-03-28 (×7): 20 mg via ORAL
  Filled 2017-03-21 (×5): qty 1
  Filled 2017-03-21: qty 2
  Filled 2017-03-21: qty 1

## 2017-03-21 MED ORDER — PIPERACILLIN-TAZOBACTAM 3.375 G IVPB 30 MIN
3.3750 g | Freq: Once | INTRAVENOUS | Status: AC
  Administered 2017-03-21: 3.375 g via INTRAVENOUS
  Filled 2017-03-21: qty 50

## 2017-03-21 MED ORDER — SODIUM CHLORIDE 0.9 % IV SOLN
INTRAVENOUS | Status: AC
  Administered 2017-03-21: 23:00:00 via INTRAVENOUS

## 2017-03-21 MED ORDER — SODIUM CHLORIDE 0.9 % IV BOLUS (SEPSIS)
2000.0000 mL | Freq: Once | INTRAVENOUS | Status: AC
  Administered 2017-03-21: 2000 mL via INTRAVENOUS

## 2017-03-21 MED ORDER — SODIUM CHLORIDE 0.9 % IV BOLUS (SEPSIS)
1000.0000 mL | Freq: Once | INTRAVENOUS | Status: AC
  Administered 2017-03-21: 1000 mL via INTRAVENOUS

## 2017-03-21 MED ORDER — ONDANSETRON HCL 4 MG/2ML IJ SOLN: 4.0000 mg | Freq: Four times a day (QID) | INTRAMUSCULAR | Status: DC | PRN

## 2017-03-21 MED ORDER — AMMONIUM LACTATE 12 % EX LOTN
TOPICAL_LOTION | Freq: Every evening | CUTANEOUS | Status: DC | PRN
  Filled 2017-03-21: qty 57

## 2017-03-21 MED ORDER — AMMONIUM LACTATE 12 % EX LOTN
TOPICAL_LOTION | Freq: Every evening | CUTANEOUS | Status: DC | PRN
  Filled 2017-03-21: qty 400

## 2017-03-21 MED ORDER — TIZANIDINE HCL 4 MG PO TABS
4.0000 mg | ORAL_TABLET | Freq: Three times a day (TID) | ORAL | Status: DC | PRN
  Administered 2017-03-27 – 2017-03-29 (×2): 4 mg via ORAL
  Filled 2017-03-21 (×2): qty 1

## 2017-03-21 MED ORDER — POLYETHYLENE GLYCOL 3350 17 G PO PACK: 17.0000 g | PACK | Freq: Every day | ORAL | Status: DC | PRN

## 2017-03-21 MED ORDER — ASPIRIN 81 MG PO CHEW
81.0000 mg | CHEWABLE_TABLET | Freq: Every day | ORAL | Status: DC
  Administered 2017-03-22 – 2017-03-29 (×8): 81 mg via ORAL
  Filled 2017-03-21 (×8): qty 1

## 2017-03-21 NOTE — H&P (Signed)
History and Physical    [name]:[id number] DOB: [birth year] DOA: 03/21/2017  PCP: Ileana Ladd, MD   Patient coming from: Home  Chief Complaint: Right leg swelling and redness, gen weakness, malaise   HPI: [name] is a 61 y.o. male with medical history significant for obesity, OSA on CPAP, hypertension, chronic diastolic CHF, and chronic venous stasis, now presenting to the emergency department for evaluation of swelling, pain, and redness involving the right lower extremity, malaise, and generalized weakness. Patient has chronic venous stasis and chronic bilateral lower extremity edema, but swelling in the right leg worsened significantly over the past week and was accompanied by erythema and increasing pain. Patient is also developed generalized weakness and a nonspecific malaise associated with this. Today, he became acutely lightheaded upon standing, felt to the ground without losing consciousness or hitting his head, and called EMS out for evaluation. Patient was found to be tachycardic with blood pressure 72/46. He was given 2 L of normal saline en route to the hospital. He denies fevers or chills, denies chest pain or palpitations, denies dyspnea or cough, and denies abdominal pain, nausea, vomiting, or diarrhea.  ED Course: Upon arrival to the ED, patient is found to be afebrile, saturating well on room air, slightly tachycardic, blood pressure soft but stable. EKG features a sinus tachycardia with rate 102. Chest x-ray is negative for acute cardiopulmonary disease. Chemistry panel reveals a BUN of 30 and serum creatinine 1.67, up from 1.0 in 2015. CBC is notable for a leukocytosis to 18,800 and lactic acid is elevated to 2.98. Blood cultures were obtained in the ED, an additional 3 L of normal saline was given to complete the 30 cc/kg bolus, and the patient was started on empiric vancomycin and Zosyn. Blood pressure improved with the IV fluids. Patient will be admitted to the  stepdown unit for ongoing evaluation and management of sepsis secondary to right lower extremity cellulitis.    Review of Systems:  All other systems reviewed and apart from HPI, are negative.  Past Medical History:  Diagnosis Date  . Cataracts, bilateral   . Chronic insomnia   . Chronic low back pain   . Gait disorder   . History of headache   . History of melanoma   . Hydrocephalus   . Hypertension   . Obesity   . Obstructive sleep apnea on CPAP   . Pancreatitis    Secondary to gallstones  . Pancreatitis due to biliary obstruction   . Peripheral edema    Chronic venous stasis  . Peripheral neuropathy     Past Surgical History:  Procedure Laterality Date  . CARPAL TUNNEL RELEASE Bilateral   . CHOLECYSTECTOMY    . INGUINAL HERNIA REPAIR    . LUMBAR SPINE SURGERY    . TONSILLECTOMY       reports that he has never smoked. He has never used smokeless tobacco. He reports that he drinks alcohol. He reports that he does not use drugs.  No Known Allergies  Family History  Problem Relation Age of Onset  . Cancer Mother        Liver cancer  . Cancer Father        brain cancer  . Cancer Maternal Grandmother        Liver cancer  . Heart attack Maternal Grandfather      Prior to Admission medications   Medication Sig Start Date End Date Taking? Authorizing Provider  ammonium lactate (AMLACTIN) 12 %  cream Apply 1 application topically at bedtime as needed for dry skin.  03/19/14  Yes [provider]  aspirin 81 MG tablet Take 81 mg by mouth daily.   Yes [provider]  atorvastatin (LIPITOR) 20 MG tablet Take 20 mg by mouth daily.  10/13/14  Yes [provider]  cephALEXin (KEFLEX) 500 MG capsule Take 1 capsule (500 mg total) by mouth 4 (four) times daily. Patient taking differently: Take 1,000 mg by mouth 3 (three) times daily.  06/14/14  Yes Pollina, Canary Brim, MD  diclofenac (VOLTAREN) 75 MG EC tablet TAKE 1 TABLET BY MOUTH 2  TIMES DAILY  WITH A MEAL 02/05/16  Yes York Spaniel, MD  furosemide (LASIX) 40 MG tablet Take 40 mg by mouth 2 (two) times daily.   Yes [provider]  gabapentin (NEURONTIN) 300 MG capsule Take 1 capsule (300 mg total) by mouth 3 (three) times daily. Patient taking differently: Take 600 mg by mouth 3 (three) times daily.  02/29/16  Yes York Spaniel, MD  HYDROcodone-acetaminophen (NORCO) 7.5-325 MG tablet Take 1 tablet by mouth every 6 (six) hours as needed for moderate pain (Must last 28 days). 02/18/17  Yes York Spaniel, MD  nortriptyline (PAMELOR) 10 MG capsule TAKE 3 CAPSULES (30 MG TOTAL) BY MOUTH EVERY EVENING. 04/11/15  Yes York Spaniel, MD  ramipril (ALTACE) 10 MG capsule Take 10 mg by mouth 2 (two) times daily.   Yes [provider]  tiZANidine (ZANAFLEX) 4 MG tablet Take 1 tablet by mouth 3  times daily 04/21/16  Yes York Spaniel, MD  traZODone (DESYREL) 50 MG tablet Take 1 tablet by mouth at bedtime. 03/19/14  Yes [provider]  triamcinolone cream (KENALOG) 0.1 % Apply 1 application topically daily as needed. For rash   Yes [provider]    Physical Exam: Vitals:   03/21/17 1758 03/21/17 1801 03/21/17 1830 03/21/17 1943  BP: (!) 99/51  107/63 (!) 121/58  Pulse: (!) 106  (!) 102 (!) 101  Resp: 19  17 15   Temp: 98.6 F (37 C)   98.6 F (37 C)  TempSrc: Oral   Oral  SpO2: 96%  100% 97%  Weight:  (!) 158.8 kg (350 lb)    Height:          Constitutional: NAD, calm, appears chronically-ill, in apparent discomfort Eyes: PERTLA, lids and conjunctivae normal ENMT: Mucous membranes are moist. Posterior pharynx clear of any exudate or lesions.   Neck: normal, supple, no masses, no thyromegaly Respiratory: clear to auscultation bilaterally, no wheezing, no crackles. Normal respiratory effort.   Cardiovascular: Rate ~110 and regular. Bilateral leg edema into thighs. No significant JVD. Abdomen: No distension, no tenderness, no masses  palpated. Bowel sounds normal.  Musculoskeletal: No joint deformity upper and lower extremities. Normal muscle tone.  Skin: Hyperpigmentation of bilateral lower legs in gaiter distribution; lower right leg intensely erythematous, more swollen than left, and tender without appreciable fluctuance or drainage. Warm, dry, well-perfused. Neurologic: CN 2-12 grossly intact. Sensation intact, DTR normal. Strength 5/5 in all 4 limbs.  Psychiatric: Alert and oriented x 3. Pleasant and cooperative.     Labs on Admission: I have personally reviewed following labs and imaging studies  CBC:  Recent Labs Lab 03/21/17 1806  WBC 18.8*  NEUTROABS 15.9*  HGB 12.6*  HCT 37.4*  MCV 95.9  PLT 200   Basic Metabolic Panel:  Recent Labs Lab 03/21/17 1806  NA 136  K 3.6  CL 103  CO2 24  GLUCOSE 87  BUN 30*  CREATININE 1.67*  CALCIUM 7.2*   GFR: Estimated Creatinine Clearance: 72.3 mL/min (A) (by C-G formula based on SCr of 1.67 mg/dL (H)). Liver Function Tests:  Recent Labs Lab 03/21/17 1806  AST 35  ALT 26  ALKPHOS 37*  BILITOT 1.7*  PROT 6.6  ALBUMIN 2.8*   No results for input(s): LIPASE, AMYLASE in the last 168 hours. No results for input(s): AMMONIA in the last 168 hours. Coagulation Profile: No results for input(s): INR, PROTIME in the last 168 hours. Cardiac Enzymes: No results for input(s): CKTOTAL, CKMB, CKMBINDEX, TROPONINI in the last 168 hours. BNP (last 3 results) No results for input(s): PROBNP in the last 8760 hours. HbA1C: No results for input(s): HGBA1C in the last 72 hours. CBG: No results for input(s): GLUCAP in the last 168 hours. Lipid Profile: No results for input(s): CHOL, HDL, LDLCALC, TRIG, CHOLHDL, LDLDIRECT in the last 72 hours. Thyroid Function Tests: No results for input(s): TSH, T4TOTAL, FREET4, T3FREE, THYROIDAB in the last 72 hours. Anemia Panel: No results for input(s): VITAMINB12, FOLATE, FERRITIN, TIBC, IRON, RETICCTPCT in the last 72  hours. Urine analysis:    Component Value Date/Time   COLORURINE YELLOW 03/21/2017 1758   APPEARANCEUR CLOUDY (A) 03/21/2017 1758   LABSPEC 1.009 03/21/2017 1758   PHURINE 5.0 03/21/2017 1758   GLUCOSEU NEGATIVE 03/21/2017 1758   HGBUR MODERATE (A) 03/21/2017 1758   BILIRUBINUR NEGATIVE 03/21/2017 1758   KETONESUR NEGATIVE 03/21/2017 1758   PROTEINUR 30 (A) 03/21/2017 1758   UROBILINOGEN 1.0 12/01/2009 1409   NITRITE NEGATIVE 03/21/2017 1758   LEUKOCYTESUR NEGATIVE 03/21/2017 1758   Sepsis Labs: @LABRCNTIP (procalcitonin:4,lacticidven:4) )No results found for this or any previous visit (from the past 240 hour(s)).   Radiological Exams on Admission: Dg Chest Port 1 View  Result Date: 03/21/2017 CLINICAL DATA:  Shortness of breath. Hypotension. Tachycardia. Patient fell today. EXAM: PORTABLE CHEST 1 VIEW COMPARISON:  12/01/2009 FINDINGS: The heart size and mediastinal contours are within normal limits. Both lungs are clear. The visualized skeletal structures are unremarkable. IMPRESSION: No active disease. Electronically Signed   By: Francene BoyersJames  Maxwell M.D.   On: 03/21/2017 18:33    EKG: Independently reviewed. Sinus tachycardia (rate 102).   Assessment/Plan  1. Sepsis secondary to cellulitis - Pt presents with redness, swelling, and tenderness to RLE, found to febrile, hypotensive, with leukocytosis, elevated lactate, and elevated SCr  - Blood cultures were obtained in ED, 30 cc/kg NS bolus was completed (started en route), and empiric vancomycin and Zosyn was administered  - Plan to rule-out underlying DVT with venous US, continue empiric vancomycin and Zosyn for severe non-purulent cellulitis, trend lactate, follow cultures and clinical response to treatment  2. Renal insufficiency  - Pt presents with renal insufficiency of uncertain chronicity  - SCr is 1.67 on admission, up from 1.0 in 2015 with no more recent values available  - Suspect that with the acute infection, apparent  hypovolemia, and initial hypotension, this is likely an acute prerenal azotemia and improvement with IVF and treatment of infection is anticipated  - Continue IVF, hold NSAID, ACE, and diuretics - Repeat chem panel in am    3. Insomnia  - Continue trazodone qHS   4. Hypertension  - Hypotensive on EMS arrival, treated with 2 liters en route, BP improved to 99/51 by time of arrival in ED; likely secondary to #1 - Managed at home with ramipril and Lasix; these will be held until  BP and renal fxn stabilize  5. Chronic diastolic CHF - Pt is hypovolemic on admission in setting of sepsis and has been fluid-resuscitated  - He is managed at home with Lasix and ramipril; these are held initially as above - Follow daily wts and I/O's    DVT prophylaxis: sq heparin Code Status: Full  Family Communication: Daughter updated at bedside Disposition Plan: Admit to SDU Consults called: None Admission status: Inpatient    Briscoe Deutscher, MD [facility] Pager [phone]  If 7PM-7AM, please contact night-coverage [email or web address] Password [email or web address]  03/21/2017, 8:16 PM

## 2017-03-21 NOTE — ED Notes (Signed)
Writer notified EDP Allen of abnormal I stat lactic result 

## 2017-03-21 NOTE — Progress Notes (Signed)
Patient has a home CPAP and does not want to use the hospital's CPAP. Daughter will be able to bring home CPAP tomorrow 6/5. Patient request using nasal cannula for sleep tonight. Patient O2 sats currently at 95% on room air. Will continue to monitor patient.

## 2017-03-21 NOTE — ED Triage Notes (Signed)
Per [facility] states that called out to patient's home today for fall.  Patient was hypotensive and tachycardic at scene. Patient currently on antibiotics for leg infection that he has had for week now.  Patient has 16g in left AC 2L NS given in route. Vitals initial: BP 72/46 after fluids 104/53.

## 2017-03-21 NOTE — ED Notes (Signed)
Patient stuck twice for IV access/blood work, both unsuccessful.

## 2017-03-21 NOTE — Progress Notes (Signed)
Pt. seen for CPAP, uses Nasal Pillows at home and plans to remain on n/c this evening and have is CPAP and/or Nasal Pillows brought in tomarrow for use, offered Nasal Mask-declined, RT to monitor, made aware to notify if needed, RN @ bedside.

## 2017-03-21 NOTE — ED Provider Notes (Addendum)
[facility]-EMERGENCY DEPT Provider Note   CSN: [id number] Arrival date & time: 03/21/17  1741     History   Chief Complaint Chief Complaint  Patient presents with  . leg infection  . low BP  . Tachycardia    HPI [name] is a 61 y.o. male.  61 year old male presents with several-day history of right lotion the cellulitis which she has been treating with Keflex. He has had subjective chills but no recorded temperature. No vomiting or diarrhea. No abdominal chest discomfort. No cough or congestion. Urinary symptoms. Called EMS to severe weakness and was found to be hypotensive with a blood pressure in the 70s. He was treated with 2 L of IV fluids and his blood pressure improved to 100 over palp. He presents at this time for further treatment.      Past Medical History:  Diagnosis Date  . Cataracts, bilateral   . Chronic insomnia   . Chronic low back pain   . Gait disorder   . History of headache   . History of melanoma   . Hydrocephalus   . Hypertension   . Obesity   . Obstructive sleep apnea on CPAP   . Pancreatitis    Secondary to gallstones  . Pancreatitis due to biliary obstruction   . Peripheral edema    Chronic venous stasis  . Peripheral neuropathy     Patient Active Problem List   Diagnosis Date Noted  . Meralgia paresthetica 10/12/2013  . Polyneuropathy in other diseases classified elsewhere (HCC) 11/22/2012  . Obstructive sleep apnea (adult) (pediatric) 11/22/2012  . Abnormality of gait 11/22/2012  . Headache(784.0) 11/22/2012  . Pain in limb 11/22/2012  . Lumbago 11/22/2012  . Cervicalgia 11/22/2012  . Spinal stenosis in cervical region 11/22/2012  . Degeneration of intervertebral disc, site unspecified 11/22/2012  . Degeneration of cervical intervertebral disc 11/22/2012  . Obstructive hydrocephalus 11/22/2012    Past Surgical History:  Procedure Laterality Date  . CARPAL TUNNEL RELEASE Bilateral   . CHOLECYSTECTOMY    . INGUINAL HERNIA REPAIR     . LUMBAR SPINE SURGERY    . TONSILLECTOMY         Home Medications    Prior to Admission medications   Medication Sig Start Date End Date Taking? Authorizing Provider  ammonium lactate (AMLACTIN) 12 % cream Apply 1 application topically 2 (two) times daily as needed for dry skin.  03/19/14   [provider]  aspirin 81 MG tablet Take 81 mg by mouth daily.    [provider]  atorvastatin (LIPITOR) 20 MG tablet  10/13/14   [provider]  cephALEXin (KEFLEX) 500 MG capsule Take 1 capsule (500 mg total) by mouth 4 (four) times daily. 06/14/14   Gilda Crease, MD  diclofenac (VOLTAREN) 75 MG EC tablet TAKE 1 TABLET BY MOUTH 2  TIMES DAILY WITH A MEAL 02/05/16   York Spaniel, MD  furosemide (LASIX) 40 MG tablet Take 40 mg by mouth 2 (two) times daily.    [provider]  gabapentin (NEURONTIN) 300 MG capsule Take 1 capsule (300 mg total) by mouth 3 (three) times daily. 02/29/16   York Spaniel, MD  HYDROcodone-acetaminophen (NORCO) 7.5-325 MG tablet Take 1 tablet by mouth every 6 (six) hours as needed for moderate pain (Must last 28 days). 02/18/17   York Spaniel, MD  nortriptyline (PAMELOR) 10 MG capsule TAKE 3 CAPSULES (30 MG TOTAL) BY MOUTH EVERY EVENING. 04/11/15   York Spaniel,  MD  ramipril (ALTACE) 10 MG capsule Take 10 mg by mouth 2 (two) times daily.    [provider]  sulfamethoxazole-trimethoprim (BACTRIM DS) 800-160 MG per tablet Take 1 tablet by mouth 2 (two) times daily. 06/14/14   Gilda Crease, MD  tiZANidine (ZANAFLEX) 4 MG tablet Take 1 tablet by mouth 3  times daily 04/21/16   York Spaniel, MD  traZODone (DESYREL) 50 MG tablet Take 1 tablet by mouth at bedtime. 03/19/14   [provider]  triamcinolone cream (KENALOG) 0.1 % Apply 1 application topically daily as needed. For rash    [provider]    Family History Family History  Problem Relation Age of Onset  . Cancer Mother         Liver cancer  . Cancer Father        brain cancer  . Cancer Maternal Grandmother        Liver cancer  . Heart attack Maternal Grandfather     Social History Social History  Substance Use Topics  . Smoking status: Never Smoker  . Smokeless tobacco: Never Used  . Alcohol use 0.0 oz/week     Comment: Consumes alcohol on occasion     Allergies   Patient has no known allergies.   Review of Systems Review of Systems  All other systems reviewed and are negative.    Physical Exam Updated Vital Signs BP (!) 99/51 (BP Location: Right Arm)   Pulse (!) 106   Temp 98.6 F (37 C) (Oral)   Resp 19   Ht 1.854 m (6\' 1" )   Wt (!) 158.8 kg (350 lb)   SpO2 96%   BMI 46.18 kg/m   Physical Exam  Constitutional: He is oriented to person, place, and time. He appears well-developed and well-nourished.  Non-toxic appearance. No distress.  HENT:  Head: Normocephalic and atraumatic.  Eyes: Conjunctivae, EOM and lids are normal. Pupils are equal, round, and reactive to light.  Neck: Normal range of motion. Neck supple. No tracheal deviation present. No thyroid mass present.  Cardiovascular: Normal rate, regular rhythm and normal heart sounds.  Exam reveals no gallop.   No murmur heard. Pulmonary/Chest: Effort normal and breath sounds normal. No stridor. No respiratory distress. He has no decreased breath sounds. He has no wheezes. He has no rhonchi. He has no rales.  Abdominal: Soft. Normal appearance and bowel sounds are normal. He exhibits no distension. There is no tenderness. There is no rebound and no CVA tenderness.  Musculoskeletal: Normal range of motion. He exhibits no edema or tenderness.  Severe bilateral lower extremity pitting edema with the worst being on the right side which includes erythema with some evidence of lymphangitis one of the patient's right leg. He is able to move his toes back and forth. Has sensation at the toes. Severe scaling noted on both lower  extremities.  Neurological: He is alert and oriented to person, place, and time. He has normal strength. No cranial nerve deficit or sensory deficit. GCS eye subscore is 4. GCS verbal subscore is 5. GCS motor subscore is 6.  Skin: Skin is warm and dry. No abrasion and no rash noted.  Psychiatric: He has a normal mood and affect. His speech is normal and behavior is normal.  Nursing note and vitals reviewed.    ED Treatments / Results  Labs (all labs ordered are listed, but only abnormal results are displayed) Labs Reviewed  I-STAT CG4 LACTIC ACID, ED - Abnormal; Notable for  the following:       Result Value   Lactic Acid, Venous 2.98 (*)    All other components within normal limits  CULTURE, BLOOD (ROUTINE X 2)  CULTURE, BLOOD (ROUTINE X 2)  COMPREHENSIVE METABOLIC PANEL  CBC WITH DIFFERENTIAL/PLATELET  URINALYSIS, ROUTINE W REFLEX MICROSCOPIC    EKG  EKG Interpretation  Date/Time:  Monday March 21 2017 18:00:15 EDT Ventricular Rate:  102 PR Interval:    QRS Duration: 90 QT Interval:  340 QTC Calculation: 443 R Axis:   32 Text Interpretation:  Sinus tachycardia Confirmed by Freida BusmanALLEN  MD, Quinten Allerton ([id number]) on 03/21/2017 6:18:44 PM       Radiology No results found.  Procedures Procedures (including critical care time)  Medications Ordered in ED Medications  piperacillin-tazobactam (ZOSYN) IVPB 3.375 g (not administered)  vancomycin (VANCOCIN) IVPB 1000 mg/200 mL premix (not administered)  0.9 %  sodium chloride infusion (not administered)  sodium chloride 0.9 % bolus 2,000 mL (not administered)     Initial Impression / Assessment and Plan / ED Course  I have reviewed the triage vital signs and the nursing notes.  Pertinent labs & imaging results that were available during my care of the patient were reviewed by me and considered in my medical decision making (see chart for details).     Patient has elevated lactate here. Patient's initial blood pressure was 99/51. He  is mentating appropriately. Patient started on IV vancomycin as well as given IV fluids. Will omit to the medicine service  Final Clinical Impressions(s) / ED Diagnoses   Final diagnoses:  None    New Prescriptions New Prescriptions   No medications on file     Lorre NickAllen, Rennie Hack, MD 03/21/17 1821    Lorre NickAllen, Cy Bresee, MD 03/21/17 [phone]

## 2017-03-21 NOTE — Progress Notes (Signed)
Pharmacy Antibiotic Note  [name] is a 61 y.o. male admitted on 03/21/2017 with cellulitis.  Patient taking cephalexin PTA for treatment of leg cellulitis.  Patient found to be hypotensive and with an elevated lactic acid.  Code Sepsis called @ 18:04.  Initial orders for Vancomycin 1gm and Zosyn 3.375gm IV x 1 dose each ordered @ 18:30 in the ED.  Pharmacy has been consulted for Vancomycin and Zosyn dosing.  Plan:  Following initial Vancomycin 1gm dose, give Vancomycin 1500mg  Iv x 1 dose for total loading dose of 2500mg .  Will then continue with Vancomycn 1750mg  IV q24h thereafter  Zosyn 3.375gm IV q8h (each dose infused over 4 hrs)  Follow daily SCr  Follow cultures  Height: 6\' 1"  (185.4 cm) Weight: (!) 350 lb (158.8 kg) IBW/kg (Calculated) : 79.9  Temp (24hrs), Avg:98.6 F (37 C), Min:98.6 F (37 C), Max:98.6 F (37 C)   Recent Labs Lab 03/21/17 1806 03/21/17 1815  WBC 18.8*  --   CREATININE 1.67*  --   LATICACIDVEN  --  2.98*    Estimated Creatinine Clearance: 72.3 mL/min (A) (by C-G formula based on SCr of 1.67 mg/dL (H)).    No Known Allergies  Antimicrobials this admission: 6/4 vanc >>   6/4 zosyn >>    Dose adjustments this admission:    Microbiology results: 6/4 BCx: sent  Thank you for allowing pharmacy to be a part of this patient's care.  Maryellen PilePoindexter, Refugia Laneve Trefz, PharmD 03/21/2017 6:58 PM

## 2017-03-22 ENCOUNTER — Telehealth: Payer: Self-pay | Admitting: Neurology

## 2017-03-22 ENCOUNTER — Inpatient Hospital Stay (HOSPITAL_COMMUNITY): Payer: Medicare Other

## 2017-03-22 DIAGNOSIS — M7989 Other specified soft tissue disorders: Secondary | ICD-10-CM

## 2017-03-22 DIAGNOSIS — L03119 Cellulitis of unspecified part of limb: Secondary | ICD-10-CM

## 2017-03-22 DIAGNOSIS — L03115 Cellulitis of right lower limb: Secondary | ICD-10-CM

## 2017-03-22 LAB — CBC WITH DIFFERENTIAL/PLATELET
BASOS ABS: 0 10*3/uL (ref 0.0–0.1)
BASOS PCT: 0 %
Eosinophils Absolute: 0.2 10*3/uL (ref 0.0–0.7)
Eosinophils Relative: 1 %
HEMATOCRIT: 38 % — AB (ref 39.0–52.0)
Hemoglobin: 12.7 g/dL — ABNORMAL LOW (ref 13.0–17.0)
Lymphocytes Relative: 9 %
Lymphs Abs: 1.5 10*3/uL (ref 0.7–4.0)
MCH: 32.1 pg (ref 26.0–34.0)
MCHC: 33.4 g/dL (ref 30.0–36.0)
MCV: 96 fL (ref 78.0–100.0)
MONO ABS: 1.6 10*3/uL — AB (ref 0.1–1.0)
Monocytes Relative: 10 %
NEUTROS ABS: 13.2 10*3/uL — AB (ref 1.7–7.7)
Neutrophils Relative %: 80 %
PLATELETS: 205 10*3/uL (ref 150–400)
RBC: 3.96 MIL/uL — ABNORMAL LOW (ref 4.22–5.81)
RDW: 13.4 % (ref 11.5–15.5)
WBC: 16.6 10*3/uL — ABNORMAL HIGH (ref 4.0–10.5)

## 2017-03-22 LAB — COMPREHENSIVE METABOLIC PANEL
ALT: 28 U/L (ref 17–63)
AST: 33 U/L (ref 15–41)
Albumin: 2.6 g/dL — ABNORMAL LOW (ref 3.5–5.0)
Alkaline Phosphatase: 40 U/L (ref 38–126)
Anion gap: 5 (ref 5–15)
BILIRUBIN TOTAL: 1.2 mg/dL (ref 0.3–1.2)
BUN: 28 mg/dL — AB (ref 6–20)
CHLORIDE: 106 mmol/L (ref 101–111)
CO2: 24 mmol/L (ref 22–32)
Calcium: 7.3 mg/dL — ABNORMAL LOW (ref 8.9–10.3)
Creatinine, Ser: 1.2 mg/dL (ref 0.61–1.24)
GFR calc Af Amer: >60 mL/min (ref >60)
GFR calc non Af Amer: >60 mL/min (ref >60)
Glucose, Bld: 105 mg/dL — ABNORMAL HIGH (ref 65–99)
POTASSIUM: 3.3 mmol/L — AB (ref 3.5–5.1)
Sodium: 135 mmol/L (ref 135–145)
Total Protein: 6.3 g/dL — ABNORMAL LOW (ref 6.5–8.1)

## 2017-03-22 LAB — HIV ANTIBODY (ROUTINE TESTING W REFLEX): HIV SCREEN 4TH GENERATION: NONREACTIVE

## 2017-03-22 MED ORDER — HYDROCERIN EX CREA
TOPICAL_CREAM | Freq: Every day | CUTANEOUS | Status: DC
  Administered 2017-03-22: 1 via TOPICAL
  Administered 2017-03-23 – 2017-03-25 (×3): via TOPICAL
  Administered 2017-03-26: 1 via TOPICAL
  Administered 2017-03-27 – 2017-03-29 (×3): via TOPICAL
  Filled 2017-03-22: qty 113

## 2017-03-22 MED ORDER — MUPIROCIN CALCIUM 2 % EX CREA
TOPICAL_CREAM | Freq: Every day | CUTANEOUS | Status: DC
  Administered 2017-03-22: 13:00:00 via TOPICAL
  Administered 2017-03-23: 1 via TOPICAL
  Administered 2017-03-24 – 2017-03-29 (×6): via TOPICAL
  Filled 2017-03-22 (×2): qty 15

## 2017-03-22 MED ORDER — VANCOMYCIN HCL 10 G IV SOLR
1250.0000 mg | Freq: Two times a day (BID) | INTRAVENOUS | Status: DC
  Administered 2017-03-22: 1250 mg via INTRAVENOUS
  Filled 2017-03-22 (×2): qty 1250

## 2017-03-22 NOTE — Consult Note (Addendum)
WOC Nurse wound consult note Reason for Consult: Consult requested for BLE.  Pt has generalized edema and dry scabbed skin; appearance consistent with chronic lymphadenia.  He developed cellulitis to right leg during the past several days with increased edema and erythremia to right leg. Wound type: Left 4th toe with long toenail which appears to have developed an ingrown area to outer nailbed.  Full thickness wound; .3X.3X.2cm, removed loose outer scab, revealing red moist wound bed and small amt tan drainage, no odor. Right outer leg with full thickness wound; 1.3X.3X.1cm, moist yellow wound bed, no odor, small amt yellow drainage. Dressing procedure/placement/frequency: Recommend pt follow-up with a podiatrist after discharge to cut toenails. Bactroban to provide antimicrobial benefits and promote moist healing to wounds, foam dressing to protect from further injury.  Discussed plan of care with patient and he verbalized understanding. Please re-consult if further assistance is needed.  Thank-you,  Cammie Mcgeeawn Deago Burruss MSN, RN, CWOCN, DonnaWCN-AP, CNS [phone]

## 2017-03-22 NOTE — Progress Notes (Addendum)
PROGRESS NOTE  Caroll Rancherdmund L Orebaugh ZOX:[id number] DOB: 08/06/1956 DOA: 03/21/2017 PCP: Ileana LaddWong, Francis P, MD  HPI/Recap of past 24 hours:  Report feeling better, no fever, blood pressure has improved.  Assessment/Plan: Principal Problem:   Sepsis due to cellulitis [facility](HCC) Active Problems:   Essential hypertension   Chronic diastolic CHF (congestive heart failure) (HCC)   Renal insufficiency   Cellulitis of right lower extremity  Cellulitis/spesis presented on admission with hypotension sbp in the 70's , tachypnea rr 31, sinus tachycardia, heart rate 104, leukocytosis, wbc 18.8, lactic acidosis, he is admitted to stepdown initially -patient report he has a supply of keflex at home but cellulitis progressed while he is taking keflex --MRSA screening negative, blood culture no growth, ua with rare bacteria, negative nitrite, 6-30wbc, he denies urinary symptom, cxr no acute findings --He is started on vanc/zosyn since admission, improving, mrsa screen negative, d/c v anc, continue zosyn  Appreciate wound care input: "Wound type: Left 4th toe with long toenail which appears to have developed an ingrown area to outer nailbed.  Full thickness wound; .3X.3X.2cm, removed loose outer scab, revealing red moist wound bed and small amt tan drainage, no odor. Right outer leg with full thickness wound; 1.3X.3X.1cm, moist yellow wound bed, no odor, small amt yellow drainage. Dressing procedure/placement/frequency: Recommend pt follow-up with a podiatrist after discharge to cut toenails. "  Bilateral lower extremity edema, h/o chronic diastolic chf On lasix at home, lasix held due to sepsis, resume in 24-48hrs if continue to be stable Venous doppler bilateral lower extremity no DVT.   Hypokalemia: replace k, check mag  AKI:  Treating underline sepsis, renal dosing meds, repeat bmp in am   HTN: home bp meds held due to sepsis  Morbid obesity/OSA. Continue nightly cpap Body mass index is 45.4  kg/m.  Code Status: full  Family Communication: patient   Disposition Plan: transfer from stepdown to med tele   Consultants:  Wound care  Procedures:  none  Antibiotics:  vanc from admission to 6/5  Zosyn from admission   Objective: BP 125/60   Pulse 95   Temp 98.8 F (37.1 C) (Oral)   Resp (!) 27   Ht 6\' 1"  (1.854 m)   Wt (!) 156.1 kg (344 lb 2.2 oz)   SpO2 93%   BMI 45.40 kg/m   Intake/Output Summary (Last 24 hours) at 03/22/17 0853 Last data filed at 03/22/17 0646  Gross per 24 hour  Intake             1350 ml  Output              875 ml  Net              475 ml   Filed Weights   03/21/17 1801 03/21/17 2217 03/22/17 0310  Weight: (!) 158.8 kg (350 lb) (!) 156.1 kg (344 lb 2.2 oz) (!) 156.1 kg (344 lb 2.2 oz)    Exam:   General:  Obese, NAD  Cardiovascular: RRR  Respiratory: CTABL  Abdomen: Soft/ND/NT, positive BS  Musculoskeletal: chronic bilateral lower extremity Edema, 80%  right lower extremity with circumferential erythema, warm, dry scabbed skin, left 4th toe wound, right outer leg wound   Neuro: aaox3  Data Reviewed: Basic Metabolic Panel:  Recent Labs Lab 03/21/17 1806 03/22/17 0315  NA 136 135  K 3.6 3.3*  CL 103 106  CO2 24 24  GLUCOSE 87 105*  BUN 30* 28*  CREATININE 1.67* 1.20  CALCIUM 7.2* 7.3*  Liver Function Tests:  Recent Labs Lab 03/21/17 1806 03/22/17 0315  AST 35 33  ALT 26 28  ALKPHOS 37* 40  BILITOT 1.7* 1.2  PROT 6.6 6.3*  ALBUMIN 2.8* 2.6*   No results for input(s): LIPASE, AMYLASE in the last 168 hours. No results for input(s): AMMONIA in the last 168 hours. CBC:  Recent Labs Lab 03/21/17 1806 03/22/17 0315  WBC 18.8* 16.6*  NEUTROABS 15.9* 13.2*  HGB 12.6* 12.7*  HCT 37.4* 38.0*  MCV 95.9 96.0  PLT 200 205   Cardiac Enzymes:   No results for input(s): CKTOTAL, CKMB, CKMBINDEX, TROPONINI in the last 168 hours. BNP (last 3 results) No results for input(s): BNP in the last 8760  hours.  ProBNP (last 3 results) No results for input(s): PROBNP in the last 8760 hours.  CBG: No results for input(s): GLUCAP in the last 168 hours.  Recent Results (from the past 240 hour(s))  MRSA PCR Screening     Status: None   Collection Time: 03/21/17  9:30 PM  Result Value Ref Range Status   MRSA by PCR NEGATIVE NEGATIVE Final    Comment:        The GeneXpert MRSA Assay (FDA approved for NASAL specimens only), is one component of a comprehensive MRSA colonization surveillance program. It is not intended to diagnose MRSA infection nor to guide or monitor treatment for MRSA infections.      Studies: Dg Chest Port 1 View  Result Date: 03/21/2017 CLINICAL DATA:  Shortness of breath. Hypotension. Tachycardia. Patient fell today. EXAM: PORTABLE CHEST 1 VIEW COMPARISON:  12/01/2009 FINDINGS: The heart size and mediastinal contours are within normal limits. Both lungs are clear. The visualized skeletal structures are unremarkable. IMPRESSION: No active disease. Electronically Signed   By: Francene Boyers M.D.   On: 03/21/2017 18:33    Scheduled Meds: . aspirin  81 mg Oral Daily  . atorvastatin  20 mg Oral q1800  . gabapentin  600 mg Oral TID  . heparin  5,000 Units Subcutaneous Q8H  . nortriptyline  30 mg Oral QHS  . sodium chloride flush  3 mL Intravenous Q12H  . traZODone  50 mg Oral QHS    Continuous Infusions: . piperacillin-tazobactam (ZOSYN)  IV 3.375 g (03/22/17 0406)  . vancomycin       Time spent:  Marvelle Span MD, PhD  [facility] Pager [phone]. If 7PM-7AM, please contact night-coverage at [email or web address], password [facility] 03/22/2017, 8:53 AM  LOS: 1 day

## 2017-03-22 NOTE — Telephone Encounter (Signed)
Events noted.  The patient has a revisit our office on 03/28/2017.

## 2017-03-22 NOTE — Progress Notes (Signed)
Pharmacy: Re- vancomycin  Patient's a 61 y.o M presented to the ED on 03/22/17 s/p fall.  She has LE cellulitis and on keflex PTA.  Broad abx with vancomycin and zosyn were started on admission for suspected sepsis.  Scr improved to 1.20 (crcl~65N) with NS at 100 mL/hr.  All cultures have been negative thus far.   Plan: - with improving renal function, will adjust vancomycin dose to 1250 mg IV q12h - continue zosyn 3.375 gm IV q8h (infuse over 4 hrs) - monitor renal funct - f/u cultures  Dorna LeitzAnh Delonda Coley, PharmD, BCPS 03/22/2017 8:05 AM

## 2017-03-22 NOTE — Progress Notes (Addendum)
**  Preliminary report by tech**  Bilateral lower extremity venous duplex completed. There is no obvious evidence of deep or superficial vein thrombosis involving the right and left lower extremities. All clearly visualized vessels appear patent and compressible. There is no evidence of Baker's cysts bilaterally. Incidental findings are consistent with an enlarged lymph node measuring 1.3 cm high by greater than 4.3 cm wide by greater than 4.4 cm long on the right.  03/22/17 2:35 PM Olen CordialGreg Jailan Trimm RVT

## 2017-03-22 NOTE — Telephone Encounter (Signed)
Pt calling to make Dr Anne HahnWillis aware that [name]. EMS came to pick him up (due to a fall).  Pt is in a step down from ICU and should be in a regular room this evening. He does not have his cell phone with him but is expecting to be released by Friday of this week, a message could be left on his phone#.  Pt again just wanted Dr Anne HahnWillis to be made aware of what took place.

## 2017-03-22 NOTE — Care Management Note (Signed)
Case Management Note  Patient Details  Name: [name] MRN: [id number] Date of Birth: 10/11/1956  Subjective/Objective:                  Right leg swelling and redness, gen weakness, malaise   Action/Plan: Date:  March 22, 2017 Chart reviewed for concurrent status and case management needs. Will continue to follow patient progress. Discharge Planning: following for needs Expected discharge date: [id number] Marcelle SmilingRhonda Davis, BSN, [place]3, [place]   [phone]  Expected Discharge Date:                  Expected Discharge Plan:  Home/Self Care  In-House Referral:     Discharge planning Services  CM Consult  Post Acute Care Choice:    Choice offered to:     DME Arranged:    DME Agency:     HH Arranged:    HH Agency:     Status of Service:  In process, will continue to follow  If discussed at Long Length of Stay Meetings, dates discussed:    Additional Comments:  Golda AcreDavis, Rhonda Lynn, RN 03/22/2017, 8:54 AM

## 2017-03-22 NOTE — Telephone Encounter (Signed)
Dr. [name]- FYI 

## 2017-03-23 LAB — CBC WITH DIFFERENTIAL/PLATELET
BASOS PCT: 0 %
Basophils Absolute: 0 10*3/uL (ref 0.0–0.1)
EOS ABS: 0.7 10*3/uL (ref 0.0–0.7)
Eosinophils Relative: 5 %
HCT: 38.3 % — ABNORMAL LOW (ref 39.0–52.0)
HEMOGLOBIN: 12.4 g/dL — AB (ref 13.0–17.0)
Lymphocytes Relative: 12 %
Lymphs Abs: 1.5 10*3/uL (ref 0.7–4.0)
MCH: 31.6 pg (ref 26.0–34.0)
MCHC: 32.4 g/dL (ref 30.0–36.0)
MCV: 97.7 fL (ref 78.0–100.0)
MONOS PCT: 11 %
Monocytes Absolute: 1.4 10*3/uL — ABNORMAL HIGH (ref 0.1–1.0)
NEUTROS PCT: 72 %
Neutro Abs: 8.7 10*3/uL — ABNORMAL HIGH (ref 1.7–7.7)
PLATELETS: 226 10*3/uL (ref 150–400)
RBC: 3.92 MIL/uL — ABNORMAL LOW (ref 4.22–5.81)
RDW: 13.7 % (ref 11.5–15.5)
WBC: 12.3 10*3/uL — AB (ref 4.0–10.5)

## 2017-03-23 LAB — BASIC METABOLIC PANEL
Anion gap: 5 (ref 5–15)
BUN: 14 mg/dL (ref 6–20)
CALCIUM: 7.5 mg/dL — AB (ref 8.9–10.3)
CHLORIDE: 104 mmol/L (ref 101–111)
CO2: 27 mmol/L (ref 22–32)
CREATININE: 0.95 mg/dL (ref 0.61–1.24)
GFR calc Af Amer: >60 mL/min (ref >60)
Glucose, Bld: 131 mg/dL — ABNORMAL HIGH (ref 65–99)
Potassium: 3.2 mmol/L — ABNORMAL LOW (ref 3.5–5.1)
SODIUM: 136 mmol/L (ref 135–145)

## 2017-03-23 LAB — MAGNESIUM: MAGNESIUM: 1.6 mg/dL — AB (ref 1.7–2.4)

## 2017-03-23 MED ORDER — POTASSIUM CHLORIDE CRYS ER 20 MEQ PO TBCR
20.0000 meq | EXTENDED_RELEASE_TABLET | Freq: Two times a day (BID) | ORAL | Status: DC
  Administered 2017-03-23 – 2017-03-29 (×13): 20 meq via ORAL
  Filled 2017-03-23 (×13): qty 1

## 2017-03-23 MED ORDER — MAGNESIUM OXIDE 400 (241.3 MG) MG PO TABS
400.0000 mg | ORAL_TABLET | Freq: Two times a day (BID) | ORAL | Status: DC
  Administered 2017-03-23 – 2017-03-29 (×13): 400 mg via ORAL
  Filled 2017-03-23 (×13): qty 1

## 2017-03-23 MED ORDER — FUROSEMIDE 40 MG PO TABS
40.0000 mg | ORAL_TABLET | Freq: Two times a day (BID) | ORAL | Status: DC
  Administered 2017-03-23 – 2017-03-29 (×13): 40 mg via ORAL
  Filled 2017-03-23 (×13): qty 1

## 2017-03-23 NOTE — Progress Notes (Signed)
Pt continues to refuse cpap.  Pt prefers to wear 2l [place] tonight.  Pt was encouraged to call should he change his mind.

## 2017-03-23 NOTE — Progress Notes (Signed)
Pt. wants to just remain on 2 lpm n/c this evening, tolerating it well, refused CPAP, aware to notify if need, RT to monitor.

## 2017-03-23 NOTE — Progress Notes (Signed)
Attempted to call report to 4 west. Receiving RN currently off the floor. RN will call back when she is back on the floor.

## 2017-03-23 NOTE — Progress Notes (Signed)
Received from ICU A&Ox4 voices no c/o.BLE w/ severe weeping edema open areas on bil feet

## 2017-03-23 NOTE — Progress Notes (Signed)
[facility] PROGRESS NOTE  [name] ZOX:[id number] DOB: 07/28/1956 DOA: 03/21/2017 PCP: Ileana Ladd, MD  Assessment/Plan: Principal Problem:   Sepsis due to cellulitis [facility]) Active Problems:   Essential hypertension   Chronic diastolic CHF (congestive heart failure) (HCC)   Renal insufficiency   Cellulitis of right lower extremity  61 y.o. male with medical history significant for obesity, OSA on CPAP, hypertension, chronic diastolic CHF, and chronic venous stasis, now presenting to the emergency department for evaluation of swelling, pain, and redness involving the right lower extremity, malaise, and generalized weakness. he became acutely lightheaded upon standing, felt to the ground without losing consciousness or hitting his head, and called EMS out for evaluation. Patient was found to be tachycardic with blood pressure 72/46. He was given 2 L of normal saline en route to the hospital.   Cellulitis/spesis presented on admission with hypotension sbp in the 70's , tachypnea rr 31, sinus tachycardia, heart rate 104, leukocytosis, wbc 18.8, lactic acidosis, he is admitted to stepdown initially. hemodynamically improved. Sepsis physiology: resolved  -patient report he has a supply of keflex at home but cellulitis progressed while he is taking keflex -MRSA screening negative, blood culture no growth, ua with rare bacteria, negative nitrite, 6-30wbc, he denies urinary symptom, cxr no acute findings -He is started on vanc/zosyn since admission, improving, mrsa screen negative, d/c v anc, continue zosyn. Monitor   Appreciate wound care input: "Wound type: Left 4th toe with long toenail which appears to have developed an ingrown area to outer nailbed. Full thickness wound; .3X.3X.2cm, removed loose outer scab, revealing red moist wound bed and small amt tan drainage, no odor. Right outer leg with full thickness wound; 1.3X.3X.1cm, moist yellow wound bed, no odor, small amt yellow  drainage. Dressing procedure/placement/frequency: Recommend pt follow-up with a podiatrist after discharge to cut toenails. "  Bilateral lower extremity edema, h/o chronic diastolic chf. On lasix at home, lasix held due to sepsis on admission. Will resume today, check echo/PA pressure. Venous doppler bilateral lower extremity no DVT.  Hypokalemia, hypomagnesemia.  replace k. Recheck AM  AKI: Treating underline sepsis, renal dosing meds. Stable  HTN: home bp meds held due to sepsis Morbid obesity/OSA. Continue nightly cpap Body mass index is 45.4 kg/m.   TF: SDU to RNF today  Code Status: full  Family Communication: patient   Disposition Plan: transfer from stepdown to med tele   Consultants:  Wound care  Procedures:  none  Antibiotics:  vanc from admission to 6/5  Zosyn from admission  HPI/Subjective: He is hemodynamically stable. No distress. Reports feeling well.   Objective: Vitals:   03/23/17 0600 03/23/17 0700  BP:  (!) 148/94  Pulse: 93 92  Resp: (!) 29 (!) 27  Temp:      Intake/Output Summary (Last 24 hours) at 03/23/17 0804 Last data filed at 03/23/17 0728  Gross per 24 hour  Intake              400 ml  Output             1500 ml  Net            -1100 ml   Filed Weights   03/21/17 1801 03/21/17 2217 03/22/17 0310  Weight: (!) 158.8 kg (350 lb) (!) 156.1 kg (344 lb 2.2 oz) (!) 156.1 kg (344 lb 2.2 oz)    Exam:   General:  Alert, oriented   Cardiovascular: s1,s2 rrr  Respiratory: few crackles LL  Abdomen: soft,  nt  Musculoskeletal: Bl LE lymphedema, left leg cellulitis    Data Reviewed: Basic Metabolic Panel:  Recent Labs Lab 03/21/17 1806 03/22/17 0315 03/23/17 0340  NA 136 135 136  K 3.6 3.3* 3.2*  CL 103 106 104  CO2 24 24 27   GLUCOSE 87 105* 131*  BUN 30* 28* 14  CREATININE 1.67* 1.20 0.95  CALCIUM 7.2* 7.3* 7.5*  MG  --   --  1.6*   Liver Function Tests:  Recent Labs Lab 03/21/17 1806 03/22/17 0315   AST 35 33  ALT 26 28  ALKPHOS 37* 40  BILITOT 1.7* 1.2  PROT 6.6 6.3*  ALBUMIN 2.8* 2.6*   No results for input(s): LIPASE, AMYLASE in the last 168 hours. No results for input(s): AMMONIA in the last 168 hours. CBC:  Recent Labs Lab 03/21/17 1806 03/22/17 0315 03/23/17 0340  WBC 18.8* 16.6* 12.3*  NEUTROABS 15.9* 13.2* 8.7*  HGB 12.6* 12.7* 12.4*  HCT 37.4* 38.0* 38.3*  MCV 95.9 96.0 97.7  PLT 200 205 226   Cardiac Enzymes: No results for input(s): CKTOTAL, CKMB, CKMBINDEX, TROPONINI in the last 168 hours. BNP (last 3 results) No results for input(s): BNP in the last 8760 hours.  ProBNP (last 3 results) No results for input(s): PROBNP in the last 8760 hours.  CBG: No results for input(s): GLUCAP in the last 168 hours.  Recent Results (from the past 240 hour(s))  Blood Culture (routine x 2)     Status: None (Preliminary result)   Collection Time: 03/21/17  6:09 PM  Result Value Ref Range Status   Specimen Description LEFT ANTECUBITAL  Final   Special Requests   Final    BOTTLES DRAWN AEROBIC AND ANAEROBIC Blood Culture adequate volume   Culture   Final    NO GROWTH < 24 HOURS Performed at [facility], [address] [place], [place] [address]    Report Status PENDING  Incomplete  Blood Culture (routine x 2)     Status: None (Preliminary result)   Collection Time: 03/21/17  7:01 PM  Result Value Ref Range Status   Specimen Description BLOOD RIGHT ANTECUBITAL  Final   Special Requests   Final    BOTTLES DRAWN AEROBIC AND ANAEROBIC Blood Culture adequate volume   Culture   Final    NO GROWTH < 24 HOURS Performed at [facility], [address] [place], [place] [address]    Report Status PENDING  Incomplete  MRSA PCR Screening     Status: None   Collection Time: 03/21/17  9:30 PM  Result Value Ref Range Status   MRSA by PCR NEGATIVE NEGATIVE Final    Comment:        The GeneXpert MRSA Assay (FDA approved for NASAL specimens only), is one  component of a comprehensive MRSA colonization surveillance program. It is not intended to diagnose MRSA infection nor to guide or monitor treatment for MRSA infections.      Studies: Dg Chest Port 1 View  Result Date: 03/21/2017 CLINICAL DATA:  Shortness of breath. Hypotension. Tachycardia. Patient fell today. EXAM: PORTABLE CHEST 1 VIEW COMPARISON:  12/01/2009 FINDINGS: The heart size and mediastinal contours are within normal limits. Both lungs are clear. The visualized skeletal structures are unremarkable. IMPRESSION: No active disease. Electronically Signed   By: Francene BoyersJames  Maxwell M.D.   On: 03/21/2017 18:33    Scheduled Meds: . aspirin  81 mg Oral Daily  . atorvastatin  20 mg Oral q1800  .  gabapentin  600 mg Oral TID  . heparin  5,000 Units Subcutaneous Q8H  . hydrocerin   Topical Daily  . mupirocin cream   Topical Daily  . nortriptyline  30 mg Oral QHS  . sodium chloride flush  3 mL Intravenous Q12H  . traZODone  50 mg Oral QHS   Continuous Infusions: . piperacillin-tazobactam (ZOSYN)  IV 3.375 g (03/23/17 0442)    Principal Problem:   Sepsis due to cellulitis [facility]) Active Problems:   Essential hypertension   Chronic diastolic CHF (congestive heart failure) (HCC)   Renal insufficiency   Cellulitis of right lower extremity    Time spent: >35 minutes     Esperanza Sheets  [facility] Pager [phone]. If 7PM-7AM, please contact night-coverage at [email or web address], password [facility] 03/23/2017, 8:04 AM  LOS: 2 days

## 2017-03-24 ENCOUNTER — Inpatient Hospital Stay (HOSPITAL_COMMUNITY): Payer: Medicare Other

## 2017-03-24 DIAGNOSIS — I5032 Chronic diastolic (congestive) heart failure: Secondary | ICD-10-CM

## 2017-03-24 LAB — ECHOCARDIOGRAM COMPLETE
Height: 73 in
Weight: 5470.94 oz

## 2017-03-24 LAB — BASIC METABOLIC PANEL
ANION GAP: 7 (ref 5–15)
BUN: 11 mg/dL (ref 6–20)
CO2: 29 mmol/L (ref 22–32)
Calcium: 7.8 mg/dL — ABNORMAL LOW (ref 8.9–10.3)
Chloride: 101 mmol/L (ref 101–111)
Creatinine, Ser: 0.86 mg/dL (ref 0.61–1.24)
GFR calc Af Amer: >60 mL/min (ref >60)
GFR calc non Af Amer: >60 mL/min (ref >60)
GLUCOSE: 110 mg/dL — AB (ref 65–99)
POTASSIUM: 3.2 mmol/L — AB (ref 3.5–5.1)
Sodium: 137 mmol/L (ref 135–145)

## 2017-03-24 MED ORDER — ALBUTEROL SULFATE (2.5 MG/3ML) 0.083% IN NEBU: 2.5000 mg | INHALATION_SOLUTION | Freq: Four times a day (QID) | RESPIRATORY_TRACT | Status: DC | PRN

## 2017-03-24 MED ORDER — POTASSIUM CHLORIDE CRYS ER 20 MEQ PO TBCR
40.0000 meq | EXTENDED_RELEASE_TABLET | Freq: Once | ORAL | Status: AC
  Administered 2017-03-24: 40 meq via ORAL
  Filled 2017-03-24: qty 2

## 2017-03-24 MED ORDER — ALBUTEROL SULFATE (2.5 MG/3ML) 0.083% IN NEBU
2.5000 mg | INHALATION_SOLUTION | Freq: Four times a day (QID) | RESPIRATORY_TRACT | Status: DC
  Administered 2017-03-24: 2.5 mg via RESPIRATORY_TRACT
  Filled 2017-03-24: qty 3

## 2017-03-24 NOTE — Progress Notes (Signed)
PROGRESS NOTE    [name]  ZOX:[id number] DOB: 1956-06-15 DOA: 03/21/2017 PCP: Ileana Ladd, MD    Brief Narrative: 61 y.o.malewith medical history significant forobesity, OSA on CPAP, hypertension, chronic diastolic CHF, and chronic venous stasis, now presenting to the emergency department for evaluation of swelling, pain, and redness involving the right lower extremity, malaise, and generalized weakness. he became acutely lightheaded upon standing, felt to the ground without losing consciousness or hitting his head, and called EMS out for evaluation. Patient was found to be tachycardic with blood pressure 72/46. He was given 2 L of normal saline en route to the hospital.   Assessment & Plan:   Principal Problem:   Sepsis due to cellulitis [facility]) Active Problems:   Essential hypertension   Chronic diastolic CHF (congestive heart failure) (HCC)   Renal insufficiency   Cellulitis of right lower extremity   Bilateral LE cellulitis on superimpose lymphedema.  Continue with IV zosyn.  Redness improved.  Continue with lasix.  Needs referral to lymphedema clinic.  Check x ray left foot.  Doppler negative,.  Appreciate wound care recommendations.   2-Sepsis; secondary to cellulitis. presented on admission with hypotension sbp in the 70's , tachypnea rr 31, sinus tachycardia, heart rate 104, leukocytosis, wbc 18.8, lactic acidosis, Improved.  On IV antibiotics.  MRSA screening negative Blood culture no growth to date,   3-BL LE edema, chronic diastolic HF;  Lasix was resume.    Acute bronchitis;  Report cough, wheezing.  Start nebulizer,  Check chest  Xray   Hypokalemia, hypomagnesemia;  Low, replete   AKI; improved with fluids.  Cr peak to 1.6.  Monitor on lasix.   HTN: home bp meds held due to sepsis Morbid obesity/OSA.Continue nightly cpap Body mass index is 45.4 kg/m.   DVT prophylaxis: heparin  Code Status: full code.  Family Communication: care  discussed with patient.  Disposition Plan: home in 24 to 48 hours.    Consultants:   none   Procedures: doppler negative for DVT   Antimicrobials:   Zosyn    Subjective: He report improvement of redness, and decrease swelling.  Complaining of cough, wheezing.   Objective: Vitals:   03/24/17 0429 03/24/17 1326 03/24/17 1425 03/24/17 1430  BP:  (!) 158/84    Pulse:  100    Resp:  19    Temp:  98.3 F (36.8 C)    TempSrc:  Oral    SpO2:  93% 93% 93%  Weight: (!) 155.1 kg (341 lb 14.9 oz)     Height:        Intake/Output Summary (Last 24 hours) at 03/24/17 1656 Last data filed at 03/24/17 1600  Gross per 24 hour  Intake             1530 ml  Output             2350 ml  Net             -820 ml   Filed Weights   03/21/17 2217 03/22/17 0310 03/24/17 0429  Weight: (!) 156.1 kg (344 lb 2.2 oz) (!) 156.1 kg (344 lb 2.2 oz) (!) 155.1 kg (341 lb 14.9 oz)    Examination:  General exam: Appears calm and comfortable  Respiratory system: bilateral wheezing. Respiratory effort normal. Cardiovascular system: S1 & S2 heard, RRR. No JVD, murmurs, rubs, gallops or clicks. No pedal edema. Gastrointestinal system: Abdomen is nondistended, soft and nontender. No organomegaly or masses felt. Normal bowel sounds heard.  Central nervous system: Alert and oriented. No focal neurological deficits. Extremities: Symmetric 5 x 5 power. Skin: Bilateral LE with lymphedema, redness edema, left with very thick skin and black hyperpigmentation.  Psychiatry: Judgement and insight appear normal. Mood & affect appropriate.     Data Reviewed: I have personally reviewed following labs and imaging studies  CBC:  Recent Labs Lab 03/21/17 1806 03/22/17 0315 03/23/17 0340  WBC 18.8* 16.6* 12.3*  NEUTROABS 15.9* 13.2* 8.7*  HGB 12.6* 12.7* 12.4*  HCT 37.4* 38.0* 38.3*  MCV 95.9 96.0 97.7  PLT 200 205 226   Basic Metabolic Panel:  Recent Labs Lab 03/21/17 1806 03/22/17 0315  03/23/17 0340 03/24/17 0512  NA 136 135 136 137  K 3.6 3.3* 3.2* 3.2*  CL 103 106 104 101  CO2 24 24 27 29   GLUCOSE 87 105* 131* 110*  BUN 30* 28* 14 11  CREATININE 1.67* 1.20 0.95 0.86  CALCIUM 7.2* 7.3* 7.5* 7.8*  MG  --   --  1.6*  --    GFR: Estimated Creatinine Clearance: 138.6 mL/min (by C-G formula based on SCr of 0.86 mg/dL). Liver Function Tests:  Recent Labs Lab 03/21/17 1806 03/22/17 0315  AST 35 33  ALT 26 28  ALKPHOS 37* 40  BILITOT 1.7* 1.2  PROT 6.6 6.3*  ALBUMIN 2.8* 2.6*   No results for input(s): LIPASE, AMYLASE in the last 168 hours. No results for input(s): AMMONIA in the last 168 hours. Coagulation Profile:  Recent Labs Lab 03/21/17 2020  INR 1.42   Cardiac Enzymes: No results for input(s): CKTOTAL, CKMB, CKMBINDEX, TROPONINI in the last 168 hours. BNP (last 3 results) No results for input(s): PROBNP in the last 8760 hours. HbA1C: No results for input(s): HGBA1C in the last 72 hours. CBG: No results for input(s): GLUCAP in the last 168 hours. Lipid Profile: No results for input(s): CHOL, HDL, LDLCALC, TRIG, CHOLHDL, LDLDIRECT in the last 72 hours. Thyroid Function Tests: No results for input(s): TSH, T4TOTAL, FREET4, T3FREE, THYROIDAB in the last 72 hours. Anemia Panel: No results for input(s): VITAMINB12, FOLATE, FERRITIN, TIBC, IRON, RETICCTPCT in the last 72 hours. Sepsis Labs:  Recent Labs Lab 03/21/17 1815 03/21/17 2020 03/21/17 2313  PROCALCITON  --  3.67  --   LATICACIDVEN 2.98* 1.9 1.7    Recent Results (from the past 240 hour(s))  Blood Culture (routine x 2)     Status: None (Preliminary result)   Collection Time: 03/21/17  6:09 PM  Result Value Ref Range Status   Specimen Description LEFT ANTECUBITAL  Final   Special Requests   Final    BOTTLES DRAWN AEROBIC AND ANAEROBIC Blood Culture adequate volume   Culture   Final    NO GROWTH 3 DAYS Performed at [facility], [address] [place], [place] [address]     Report Status PENDING  Incomplete  Blood Culture (routine x 2)     Status: None (Preliminary result)   Collection Time: 03/21/17  7:01 PM  Result Value Ref Range Status   Specimen Description BLOOD RIGHT ANTECUBITAL  Final   Special Requests   Final    BOTTLES DRAWN AEROBIC AND ANAEROBIC Blood Culture adequate volume   Culture   Final    NO GROWTH 3 DAYS Performed at [facility], [address], [place], [place] [address]    Report Status PENDING  Incomplete  MRSA PCR Screening     Status: None   Collection Time: 03/21/17  9:30  PM  Result Value Ref Range Status   MRSA by PCR NEGATIVE NEGATIVE Final    Comment:        The GeneXpert MRSA Assay (FDA approved for NASAL specimens only), is one component of a comprehensive MRSA colonization surveillance program. It is not intended to diagnose MRSA infection nor to guide or monitor treatment for MRSA infections.          Radiology Studies: Dg Chest Port 1 View  Result Date: 03/24/2017 CLINICAL DATA:  Shortness of breath.  Hypertension. EXAM: PORTABLE CHEST 1 VIEW COMPARISON:  March 21, 2017 FINDINGS: There is no edema or consolidation. Heart size and pulmonary vascularity are normal. No adenopathy. No bone lesions. There are surgical clips in the left axillary region. IMPRESSION: No edema or consolidation. Electronically Signed   By: Bretta BangWilliam  Woodruff III M.D.   On: 03/24/2017 12:48   Dg Foot Complete Left  Result Date: 03/24/2017 CLINICAL DATA:  Nonhealing ulcer fourth digit EXAM: LEFT FOOT - COMPLETE 3+ VIEW COMPARISON:  None. FINDINGS: Frontal, oblique, and lateral views were obtained. There is marked generalized soft tissue swelling. There is no evident fracture or dislocation. There is no appreciable joint space narrowing. No erosive change or bony destruction. No soft tissue air or radiopaque foreign body. IMPRESSION: Marked soft tissue edema. No erosive change or bony destruction evident. No fracture or dislocation. No  soft tissue abscess apparent by radiography. If there remains concern for potential osteomyelitis, nuclear medicine three-phase bone scan or MRI of the left foot pre and post-contrast be helpful to further assess. Electronically Signed   By: Bretta BangWilliam  Woodruff III M.D.   On: 03/24/2017 12:52        Scheduled Meds: . aspirin  81 mg Oral Daily  . atorvastatin  20 mg Oral q1800  . furosemide  40 mg Oral BID  . gabapentin  600 mg Oral TID  . heparin  5,000 Units Subcutaneous Q8H  . hydrocerin   Topical Daily  . magnesium oxide  400 mg Oral BID  . mupirocin cream   Topical Daily  . nortriptyline  30 mg Oral QHS  . potassium chloride  20 mEq Oral BID  . sodium chloride flush  3 mL Intravenous Q12H  . traZODone  50 mg Oral QHS   Continuous Infusions: . piperacillin-tazobactam (ZOSYN)  IV 3.375 g (03/24/17 1210)     LOS: 3 days    Time spent: 35 minutes.     Alba Coryegalado, Cletus Paris A, MD [facility] Pager [phone]  If 7PM-7AM, please contact night-coverage [email or web address] Password [email or web address] 03/24/2017, 4:56 PM

## 2017-03-24 NOTE — Progress Notes (Signed)
  Echocardiogram 2D Echocardiogram has been performed.  Sheralyn BoatmanWest, Paraskevi Funez R 03/24/2017, 11:00 AM

## 2017-03-24 NOTE — Progress Notes (Signed)
Pharmacy Antibiotic Note  [name] is a 61 y.o. male admitted on 03/21/2017 with sepsis and cellulitis.  Cellulitis had progressed at home on Keflex.  Pharmacy has been consulted for zosyn dosing.  Renal function improved to WNL.  Plan: Zosyn 3.375g IV q8h (4 hour infusion time). Current dosage is appropriate and need for further dosage adjustment appears unlikely at present. Will sign off at this time.  Please reconsult if a change in clinical status warrants re-evaluation of dosage.    Height: 6\' 1"  (185.4 cm) Weight: (!) 341 lb 14.9 oz (155.1 kg) IBW/kg (Calculated) : 79.9  Temp (24hrs), Avg:98.3 F (36.8 C), Min:97.8 F (36.6 C), Max:98.7 F (37.1 C)   Recent Labs Lab 03/21/17 1806 03/21/17 1815 03/21/17 2020 03/21/17 2313 03/22/17 0315 03/23/17 0340 03/24/17 0512  WBC 18.8*  --   --   --  16.6* 12.3*  --   CREATININE 1.67*  --   --   --  1.20 0.95 0.86  LATICACIDVEN  --  2.98* 1.9 1.7  --   --   --     Estimated Creatinine Clearance: 138.6 mL/min (by C-G formula based on SCr of 0.86 mg/dL).    No Known Allergies  Antimicrobials this admission:  6/4 vanc >>  6/5 6/4 zosyn >>    Dose adjustments this admission:     Microbiology results:  6/4 BCx x2: ngtd 6/4 MRSA PCR: neg 6/5 HIV Ab: neg  Thank you for allowing pharmacy to be a part of this patient's care.  Clance BollRunyon, Tamika Nou 03/24/2017 12:04 PM

## 2017-03-24 NOTE — Progress Notes (Signed)
RT NOTE:  PT refuses CPAP therapy tonight. Pt understands to contact RT if he changes his mind.

## 2017-03-25 LAB — CBC
HEMATOCRIT: 40.8 % (ref 39.0–52.0)
HEMOGLOBIN: 13.1 g/dL (ref 13.0–17.0)
MCH: 31.8 pg (ref 26.0–34.0)
MCHC: 32.1 g/dL (ref 30.0–36.0)
MCV: 99 fL (ref 78.0–100.0)
Platelets: 270 10*3/uL (ref 150–400)
RBC: 4.12 MIL/uL — ABNORMAL LOW (ref 4.22–5.81)
RDW: 13.6 % (ref 11.5–15.5)
WBC: 11.4 10*3/uL — ABNORMAL HIGH (ref 4.0–10.5)

## 2017-03-25 LAB — BASIC METABOLIC PANEL
Anion gap: 9 (ref 5–15)
BUN: 11 mg/dL (ref 6–20)
CHLORIDE: 98 mmol/L — AB (ref 101–111)
CO2: 31 mmol/L (ref 22–32)
CREATININE: 0.79 mg/dL (ref 0.61–1.24)
Calcium: 8.2 mg/dL — ABNORMAL LOW (ref 8.9–10.3)
GFR calc Af Amer: >60 mL/min (ref >60)
GFR calc non Af Amer: >60 mL/min (ref >60)
GLUCOSE: 116 mg/dL — AB (ref 65–99)
Potassium: 3.8 mmol/L (ref 3.5–5.1)
SODIUM: 138 mmol/L (ref 135–145)

## 2017-03-25 LAB — MAGNESIUM: Magnesium: 1.5 mg/dL — ABNORMAL LOW (ref 1.7–2.4)

## 2017-03-25 MED ORDER — MAGNESIUM SULFATE 2 GM/50ML IV SOLN
2.0000 g | Freq: Once | INTRAVENOUS | Status: AC
  Administered 2017-03-25: 2 g via INTRAVENOUS
  Filled 2017-03-25: qty 50

## 2017-03-25 NOTE — Evaluation (Signed)
Physical Therapy Evaluation Patient Details Name: [name] MRN: [id number] DOB: 20-Sep-1956 Today's Date: 03/25/2017   History of Present Illness  61 y.o. male with medical history significant for obesity, OSA on CPAP, hydrocephalus, peripheral neuropathy, hypertension, chronic diastolic CHF, and chronic venous stasis and admitted with sepsis due to bilateral LE cellulitis  Clinical Impression  Pt admitted with above diagnosis. Pt currently with functional limitations due to the deficits listed below (see PT Problem List).  Pt will benefit from skilled PT to increase their independence and safety with mobility to allow discharge to the venue listed below.  Pt tolerated ambulation well.  Pt typically uses SPC and would benefit from RW upon d/c.     Follow Up Recommendations Home health PT    Equipment Recommendations  Rolling walker with 5" wheels    Recommendations for Other Services       Precautions / Restrictions Precautions Precautions: None      Mobility  Bed Mobility Overal bed mobility: Needs Assistance Bed Mobility: Supine to Sit     Supine to sit: HOB elevated;Min guard     General bed mobility comments: provided a hand for pt to self assist  Transfers Overall transfer level: Needs assistance Equipment used: Rolling walker (2 wheeled) Transfers: Sit to/from Stand Sit to Stand: Min guard         General transfer comment: rocking technique to self assist utilized  Ambulation/Gait Ambulation/Gait assistance: Min guard Ambulation Distance (Feet): 400 Feet Assistive device: Rolling walker (2 wheeled) Gait Pattern/deviations: Step-through pattern;Decreased stride length;Trunk flexed     General Gait Details: chronic back pain, pt reports ambulating feels good though, able to tolerate good distance  Stairs            Wheelchair Mobility    Modified Rankin (Stroke Patients Only)       Balance                                              Pertinent Vitals/Pain Pain Assessment: No/denies pain    Home Living Family/patient expects to be discharged to:: Private residence Living Arrangements: Alone Available Help at Discharge: Family;Available PRN/intermittently Type of Home: House Home Access: Level entry     Home Layout: Able to live on main level with bedroom/bathroom Home Equipment: Cane - single point      Prior Function Level of Independence: Independent with assistive device(s)               Hand Dominance        Extremity/Trunk Assessment        Lower Extremity Assessment Lower Extremity Assessment: Generalized weakness (increased body habitus as well as cellulitis and lymphedema in LEs)       Communication   Communication: No difficulties  Cognition Arousal/Alertness: Awake/alert Behavior During Therapy: WFL for tasks assessed/performed Overall Cognitive Status: Within Functional Limits for tasks assessed                                        General Comments      Exercises     Assessment/Plan    PT Assessment Patient needs continued PT services  PT Problem List Decreased strength;Decreased mobility;Obesity;Decreased activity tolerance;Decreased knowledge of use of DME       PT Treatment  Interventions Gait training;DME instruction;Therapeutic activities;Therapeutic exercise;Functional mobility training;Patient/family education    PT Goals (Current goals can be found in the Care Plan section)  Acute Rehab PT Goals PT Goal Formulation: With patient Time For Goal Achievement: 04/01/17 Potential to Achieve Goals: Good    Frequency Min 3X/week   Barriers to discharge        Co-evaluation               AM-PAC PT "6 Clicks" Daily Activity  Outcome Measure Difficulty turning over in bed (including adjusting bedclothes, sheets and blankets)?: None Difficulty moving from lying on back to sitting on the side of the bed? : A Little Difficulty  sitting down on and standing up from a chair with arms (e.g., wheelchair, bedside commode, etc,.)?: A Little Help needed moving to and from a bed to chair (including a wheelchair)?: A Little Help needed walking in hospital room?: A Little Help needed climbing 3-5 steps with a railing? : A Little 6 Click Score: 19    End of Session   Activity Tolerance: Patient tolerated treatment well Patient left: in chair;with call bell/phone within reach   PT Visit Diagnosis: Difficulty in walking, not elsewhere classified (R26.2)    Time: [id number] PT Time Calculation (min) (ACUTE ONLY): 24 min   Charges:   PT Evaluation $PT Eval Low Complexity: 1 Procedure     PT G CodesZenovia Jarred:       Kati Cherylanne Ardelean, PT, DPT 03/25/2017 Pager: [phone]   Maida SaleLEMYRE,KATHrine E 03/25/2017, 11:59 AM

## 2017-03-25 NOTE — Progress Notes (Signed)
PROGRESS NOTE    [name]  ZOX:[id number] DOB: 11/17/1955 DOA: 03/21/2017 PCP: Ileana LaddWong, Francis P, MD    Brief Narrative: 61 y.o.malewith medical history significant forobesity, OSA on CPAP, hypertension, chronic diastolic CHF, and chronic venous stasis, now presenting to the emergency department for evaluation of swelling, pain, and redness involving the right lower extremity, malaise, and generalized weakness. he became acutely lightheaded upon standing, felt to the ground without losing consciousness or hitting his head, and called EMS out for evaluation. Patient was found to be tachycardic with blood pressure 72/46. He was given 2 L of normal saline en route to the hospital.   Assessment & Plan:   Principal Problem:   Sepsis due to cellulitis [facility](HCC) Active Problems:   Essential hypertension   Chronic diastolic CHF (congestive heart failure) (HCC)   Renal insufficiency   Cellulitis of right lower extremity   Bilateral LE cellulitis on superimpose lymphedema.  Continue with IV zosyn.  Redness improved.  Continue with lasix.  Needs referral to lymphedema clinic.   x ray left foot negative for osteo, will check bone scan,  Doppler negative,.  Appreciate wound care recommendations.   2-Sepsis; secondary to cellulitis. presented on admission with hypotension sbp in the 70's , tachypnea rr 31, sinus tachycardia, heart rate 104, leukocytosis, wbc 18.8, lactic acidosis, Improved.  On IV antibiotics.  MRSA screening negative Blood culture no growth to date,   3-BL LE edema, chronic diastolic HF;  Lasix was resume.    Acute bronchitis; improved Lung no significant wheezing.  Continue with  nebulizer,  Chest x ray negative for PNA  Hypokalemia, hypomagnesemia;  Low, replete IV  AKI; improved with fluids.  Cr peak to 1.6.  Monitor on lasix.   HTN: home bp meds held due to sepsis Morbid obesity/OSA.Continue nightly cpap Body mass index is 45.4 kg/m.   DVT prophylaxis:  heparin  Code Status: full code.  Family Communication: care discussed with patient.  Disposition Plan: home in 24 to 48 hours.    Consultants:   none   Procedures: doppler negative for DVT   Antimicrobials:   Zosyn    Subjective: Breathing better, no significant wheezing.  He doesn't feel pain in his legs   Objective: Vitals:   03/24/17 1430 03/24/17 2231 03/25/17 0500 03/25/17 0641  BP:  130/63  120/68  Pulse:  88  97  Resp:  18  19  Temp:  98.3 F (36.8 C)  98.3 F (36.8 C)  TempSrc:  Oral  Oral  SpO2: 93% 99%  98%  Weight:   (!) 154.7 kg (341 lb 0.8 oz)   Height:        Intake/Output Summary (Last 24 hours) at 03/25/17 1004 Last data filed at 03/25/17 0403  Gross per 24 hour  Intake             1290 ml  Output             1850 ml  Net             -560 ml   Filed Weights   03/22/17 0310 03/24/17 0429 03/25/17 0500  Weight: (!) 156.1 kg (344 lb 2.2 oz) (!) 155.1 kg (341 lb 14.9 oz) (!) 154.7 kg (341 lb 0.8 oz)    Examination:  General exam: NAD Respiratory system: CTA, normal respiratory effort.  Cardiovascular system: S 1, S 2 RRR Gastrointestinal system: Abdomen soft, nt nd Central nervous system ; alert and oriented.  Extremities: symmetric power.  Skin: Bilateral LE with lymphedema, less redness edema, left with very thick skin and black hyperpigmentation.      Data Reviewed: I have personally reviewed following labs and imaging studies  CBC:  Recent Labs Lab 03/21/17 1806 03/22/17 0315 03/23/17 0340 03/25/17 0541  WBC 18.8* 16.6* 12.3* 11.4*  NEUTROABS 15.9* 13.2* 8.7*  --   HGB 12.6* 12.7* 12.4* 13.1  HCT 37.4* 38.0* 38.3* 40.8  MCV 95.9 96.0 97.7 99.0  PLT 200 205 226 270   Basic Metabolic Panel:  Recent Labs Lab 03/21/17 1806 03/22/17 0315 03/23/17 0340 03/24/17 0512 03/25/17 0541  NA 136 135 136 137 138  K 3.6 3.3* 3.2* 3.2* 3.8  CL 103 106 104 101 98*  CO2 24 24 27 29 31   GLUCOSE 87 105* 131* 110* 116*  BUN 30*  28* 14 11 11   CREATININE 1.67* 1.20 0.95 0.86 0.79  CALCIUM 7.2* 7.3* 7.5* 7.8* 8.2*  MG  --   --  1.6*  --  1.5*   GFR: Estimated Creatinine Clearance: 148.7 mL/min (by C-G formula based on SCr of 0.79 mg/dL). Liver Function Tests:  Recent Labs Lab 03/21/17 1806 03/22/17 0315  AST 35 33  ALT 26 28  ALKPHOS 37* 40  BILITOT 1.7* 1.2  PROT 6.6 6.3*  ALBUMIN 2.8* 2.6*   No results for input(s): LIPASE, AMYLASE in the last 168 hours. No results for input(s): AMMONIA in the last 168 hours. Coagulation Profile:  Recent Labs Lab 03/21/17 2020  INR 1.42   Cardiac Enzymes: No results for input(s): CKTOTAL, CKMB, CKMBINDEX, TROPONINI in the last 168 hours. BNP (last 3 results) No results for input(s): PROBNP in the last 8760 hours. HbA1C: No results for input(s): HGBA1C in the last 72 hours. CBG: No results for input(s): GLUCAP in the last 168 hours. Lipid Profile: No results for input(s): CHOL, HDL, LDLCALC, TRIG, CHOLHDL, LDLDIRECT in the last 72 hours. Thyroid Function Tests: No results for input(s): TSH, T4TOTAL, FREET4, T3FREE, THYROIDAB in the last 72 hours. Anemia Panel: No results for input(s): VITAMINB12, FOLATE, FERRITIN, TIBC, IRON, RETICCTPCT in the last 72 hours. Sepsis Labs:  Recent Labs Lab 03/21/17 1815 03/21/17 2020 03/21/17 2313  PROCALCITON  --  3.67  --   LATICACIDVEN 2.98* 1.9 1.7    Recent Results (from the past 240 hour(s))  Blood Culture (routine x 2)     Status: None (Preliminary result)   Collection Time: 03/21/17  6:09 PM  Result Value Ref Range Status   Specimen Description LEFT ANTECUBITAL  Final   Special Requests   Final    BOTTLES DRAWN AEROBIC AND ANAEROBIC Blood Culture adequate volume   Culture   Final    NO GROWTH 4 DAYS Performed at [facility], [address] [place], [place] [address]    Report Status PENDING  Incomplete  Blood Culture (routine x 2)     Status: None (Preliminary result)   Collection Time: 03/21/17   7:01 PM  Result Value Ref Range Status   Specimen Description BLOOD RIGHT ANTECUBITAL  Final   Special Requests   Final    BOTTLES DRAWN AEROBIC AND ANAEROBIC Blood Culture adequate volume   Culture   Final    NO GROWTH 4 DAYS Performed at [facility], [address], [place], [place] [address]    Report Status PENDING  Incomplete  MRSA PCR Screening     Status: None   Collection Time: 03/21/17  9:30 PM  Result Value  Ref Range Status   MRSA by PCR NEGATIVE NEGATIVE Final    Comment:        The GeneXpert MRSA Assay (FDA approved for NASAL specimens only), is one component of a comprehensive MRSA colonization surveillance program. It is not intended to diagnose MRSA infection nor to guide or monitor treatment for MRSA infections.          Radiology Studies: Dg Chest Port 1 View  Result Date: 03/24/2017 CLINICAL DATA:  Shortness of breath.  Hypertension. EXAM: PORTABLE CHEST 1 VIEW COMPARISON:  March 21, 2017 FINDINGS: There is no edema or consolidation. Heart size and pulmonary vascularity are normal. No adenopathy. No bone lesions. There are surgical clips in the left axillary region. IMPRESSION: No edema or consolidation. Electronically Signed   By: Bretta Bang III M.D.   On: 03/24/2017 12:48   Dg Foot Complete Left  Result Date: 03/24/2017 CLINICAL DATA:  Nonhealing ulcer fourth digit EXAM: LEFT FOOT - COMPLETE 3+ VIEW COMPARISON:  None. FINDINGS: Frontal, oblique, and lateral views were obtained. There is marked generalized soft tissue swelling. There is no evident fracture or dislocation. There is no appreciable joint space narrowing. No erosive change or bony destruction. No soft tissue air or radiopaque foreign body. IMPRESSION: Marked soft tissue edema. No erosive change or bony destruction evident. No fracture or dislocation. No soft tissue abscess apparent by radiography. If there remains concern for potential osteomyelitis, nuclear medicine three-phase bone  scan or MRI of the left foot pre and post-contrast be helpful to further assess. Electronically Signed   By: Bretta Bang III M.D.   On: 03/24/2017 12:52        Scheduled Meds: . aspirin  81 mg Oral Daily  . atorvastatin  20 mg Oral q1800  . furosemide  40 mg Oral BID  . gabapentin  600 mg Oral TID  . heparin  5,000 Units Subcutaneous Q8H  . hydrocerin   Topical Daily  . magnesium oxide  400 mg Oral BID  . mupirocin cream   Topical Daily  . nortriptyline  30 mg Oral QHS  . potassium chloride  20 mEq Oral BID  . sodium chloride flush  3 mL Intravenous Q12H  . traZODone  50 mg Oral QHS   Continuous Infusions: . magnesium sulfate 1 - 4 g bolus IVPB    . piperacillin-tazobactam (ZOSYN)  IV Stopped (03/25/17 0733)     LOS: 4 days    Time spent: 35 minutes.     Alba Cory, MD [facility] Pager [phone]  If 7PM-7AM, please contact night-coverage [email or web address] Password [email or web address] 03/25/2017, 10:04 AM

## 2017-03-25 NOTE — Progress Notes (Signed)
Pt refuses cpap tonight.  Pt was advised that RT is available all night should he change his mind.

## 2017-03-25 NOTE — Progress Notes (Signed)
Pt selected [facility] for HHRN/PT. Will need a face to face and orders please. Referral given to in house rep.

## 2017-03-25 NOTE — Care Management Important Message (Signed)
Important Message  Patient Details  Name: [name] MRN: [id number] Date of Birth: 08/23/1956   Medicare Important Message Given:  Yes    [name], [name] 03/25/2017, 10:29 AMImportant Message  Patient Details  Name: [name] MRN: [id number] Date of Birth: 01/16/1956   Medicare Important Message Given:  Yes    [name], [name] 03/25/2017, 10:29 AM

## 2017-03-26 LAB — BASIC METABOLIC PANEL
Anion gap: 6 (ref 5–15)
BUN: 10 mg/dL (ref 6–20)
CHLORIDE: 98 mmol/L — AB (ref 101–111)
CO2: 32 mmol/L (ref 22–32)
CREATININE: 0.77 mg/dL (ref 0.61–1.24)
Calcium: 8.1 mg/dL — ABNORMAL LOW (ref 8.9–10.3)
Glucose, Bld: 129 mg/dL — ABNORMAL HIGH (ref 65–99)
POTASSIUM: 3.7 mmol/L (ref 3.5–5.1)
SODIUM: 136 mmol/L (ref 135–145)

## 2017-03-26 LAB — CULTURE, BLOOD (ROUTINE X 2)
CULTURE: NO GROWTH
Culture: NO GROWTH
Special Requests: ADEQUATE
Special Requests: ADEQUATE

## 2017-03-26 MED ORDER — DOXYCYCLINE HYCLATE 100 MG PO TABS
100.0000 mg | ORAL_TABLET | Freq: Two times a day (BID) | ORAL | Status: DC
  Administered 2017-03-26 – 2017-03-27 (×3): 100 mg via ORAL
  Filled 2017-03-26 (×3): qty 1

## 2017-03-26 NOTE — Progress Notes (Signed)
Pt refuses CPAP, RT to monitor and assess as needed.  

## 2017-03-26 NOTE — Progress Notes (Signed)
PROGRESS NOTE    [name]  RUE:[id number] DOB: 1956/07/07 DOA: 03/21/2017 PCP: Ileana Ladd, MD    Brief Narrative: 61 y.o.malewith medical history significant forobesity, OSA on CPAP, hypertension, chronic diastolic CHF, and chronic venous stasis, now presenting to the emergency department for evaluation of swelling, pain, and redness involving the right lower extremity, malaise, and generalized weakness. he became acutely lightheaded upon standing, felt to the ground without losing consciousness or hitting his head, and called EMS out for evaluation. Patient was found to be tachycardic with blood pressure 72/46. He was given 2 L of normal saline en route to the hospital.   Assessment & Plan:   Principal Problem:   Sepsis due to cellulitis [facility]) Active Problems:   Essential hypertension   Chronic diastolic CHF (congestive heart failure) (HCC)   Renal insufficiency   Cellulitis of right lower extremity   Bilateral LE cellulitis on superimpose lymphedema.  Continue with IV zosyn. I will add oral doxy.  Redness improved.  Continue with lasix.  Needs referral to lymphedema clinic.  Xray left foot negative for osteo, unable to perform MRI due to patient 's weight. Discussed with radiologist, bone scan or ct wont provide that much information. Plan is to repeat x ray in couple of week.  Doppler negative,.  Appreciate wound care recommendations.   2-Sepsis; secondary to cellulitis. presented on admission with hypotension sbp in the 70's , tachypnea rr 31, sinus tachycardia, heart rate 104, leukocytosis, wbc 18.8, lactic acidosis, Improved.  On IV antibiotics.  MRSA screening negative Blood culture no growth to date,   3-BL LE edema, chronic diastolic HF;  Continue with lasix.   Acute bronchitis; improved Lung no significant wheezing.  Continue with  nebulizer,  Chest x ray negative for PNA  Hypokalemia, hypomagnesemia;  Resolved.  Repeat labs in am.   AKI; improved  with fluids.  Cr peak to 1.6.  Monitor on lasix.   HTN: home bp meds held due to sepsis Morbid obesity/OSA.Continue nightly cpap Body mass index is 45.4 kg/m.   DVT prophylaxis: heparin  Code Status: full code.  Family Communication: care discussed with patient.  Disposition Plan: home in 24 to 48 hours.    Consultants:   none   Procedures: doppler negative for DVT   Antimicrobials:   Zosyn    Subjective: Denies dyspnea. Redness improved.   Objective: Vitals:   03/25/17 1400 03/25/17 2051 03/26/17 0544 03/26/17 0701  BP:  131/66 (!) 117/57 140/90  Pulse:  99 98 100  Resp:  20 18 20   Temp:  98.5 F (36.9 C) 98.5 F (36.9 C) 98.3 F (36.8 C)  TempSrc:  Oral Oral Oral  SpO2:  99% 97% 97%  Weight: (!) 155.2 kg (342 lb 2.5 oz)  (!) 156.8 kg (345 lb 10.9 oz)   Height:        Intake/Output Summary (Last 24 hours) at 03/26/17 1037 Last data filed at 03/26/17 0900  Gross per 24 hour  Intake              990 ml  Output             2250 ml  Net            -1260 ml   Filed Weights   03/25/17 0500 03/25/17 1400 03/26/17 0544  Weight: (!) 154.7 kg (341 lb 0.8 oz) (!) 155.2 kg (342 lb 2.5 oz) (!) 156.8 kg (345 lb 10.9 oz)    Examination:  General  exam: NAD Respiratory system: CTA Cardiovascular system: S 1, S 2 RRR Gastrointestinal system: Abdomen soft, nt nd Central nervous system ; non focal.  Extremities: symmetric power.  Skin: Bilateral LE with lymphedema, left with very thick skin and black hyperpigmentation. Redness right LE persist      Data Reviewed: I have personally reviewed following labs and imaging studies  CBC:  Recent Labs Lab 03/21/17 1806 03/22/17 0315 03/23/17 0340 03/25/17 0541  WBC 18.8* 16.6* 12.3* 11.4*  NEUTROABS 15.9* 13.2* 8.7*  --   HGB 12.6* 12.7* 12.4* 13.1  HCT 37.4* 38.0* 38.3* 40.8  MCV 95.9 96.0 97.7 99.0  PLT 200 205 226 270   Basic Metabolic Panel:  Recent Labs Lab 03/22/17 0315 03/23/17 0340  03/24/17 0512 03/25/17 0541 03/26/17 0539  NA 135 136 137 138 136  K 3.3* 3.2* 3.2* 3.8 3.7  CL 106 104 101 98* 98*  CO2 24 27 29 31  32  GLUCOSE 105* 131* 110* 116* 129*  BUN 28* 14 11 11 10   CREATININE 1.20 0.95 0.86 0.79 0.77  CALCIUM 7.3* 7.5* 7.8* 8.2* 8.1*  MG  --  1.6*  --  1.5*  --    GFR: Estimated Creatinine Clearance: 149.9 mL/min (by C-G formula based on SCr of 0.77 mg/dL). Liver Function Tests:  Recent Labs Lab 03/21/17 1806 03/22/17 0315  AST 35 33  ALT 26 28  ALKPHOS 37* 40  BILITOT 1.7* 1.2  PROT 6.6 6.3*  ALBUMIN 2.8* 2.6*   No results for input(s): LIPASE, AMYLASE in the last 168 hours. No results for input(s): AMMONIA in the last 168 hours. Coagulation Profile:  Recent Labs Lab 03/21/17 2020  INR 1.42   Cardiac Enzymes: No results for input(s): CKTOTAL, CKMB, CKMBINDEX, TROPONINI in the last 168 hours. BNP (last 3 results) No results for input(s): PROBNP in the last 8760 hours. HbA1C: No results for input(s): HGBA1C in the last 72 hours. CBG: No results for input(s): GLUCAP in the last 168 hours. Lipid Profile: No results for input(s): CHOL, HDL, LDLCALC, TRIG, CHOLHDL, LDLDIRECT in the last 72 hours. Thyroid Function Tests: No results for input(s): TSH, T4TOTAL, FREET4, T3FREE, THYROIDAB in the last 72 hours. Anemia Panel: No results for input(s): VITAMINB12, FOLATE, FERRITIN, TIBC, IRON, RETICCTPCT in the last 72 hours. Sepsis Labs:  Recent Labs Lab 03/21/17 1815 03/21/17 2020 03/21/17 2313  PROCALCITON  --  3.67  --   LATICACIDVEN 2.98* 1.9 1.7    Recent Results (from the past 240 hour(s))  Blood Culture (routine x 2)     Status: None (Preliminary result)   Collection Time: 03/21/17  6:09 PM  Result Value Ref Range Status   Specimen Description LEFT ANTECUBITAL  Final   Special Requests   Final    BOTTLES DRAWN AEROBIC AND ANAEROBIC Blood Culture adequate volume   Culture   Final    NO GROWTH 4 DAYS Performed at [facility], [address] [place], [place] [address]    Report Status PENDING  Incomplete  Blood Culture (routine x 2)     Status: None (Preliminary result)   Collection Time: 03/21/17  7:01 PM  Result Value Ref Range Status   Specimen Description BLOOD RIGHT ANTECUBITAL  Final   Special Requests   Final    BOTTLES DRAWN AEROBIC AND ANAEROBIC Blood Culture adequate volume   Culture   Final    NO GROWTH 4 DAYS Performed at [facility], [address] [place], [place]  [address]    Report Status PENDING  Incomplete  MRSA PCR Screening     Status: None   Collection Time: 03/21/17  9:30 PM  Result Value Ref Range Status   MRSA by PCR NEGATIVE NEGATIVE Final    Comment:        The GeneXpert MRSA Assay (FDA approved for NASAL specimens only), is one component of a comprehensive MRSA colonization surveillance program. It is not intended to diagnose MRSA infection nor to guide or monitor treatment for MRSA infections.          Radiology Studies: Dg Chest Port 1 View  Result Date: 03/24/2017 CLINICAL DATA:  Shortness of breath.  Hypertension. EXAM: PORTABLE CHEST 1 VIEW COMPARISON:  March 21, 2017 FINDINGS: There is no edema or consolidation. Heart size and pulmonary vascularity are normal. No adenopathy. No bone lesions. There are surgical clips in the left axillary region. IMPRESSION: No edema or consolidation. Electronically Signed   By: Bretta Bang III M.D.   On: 03/24/2017 12:48   Dg Foot Complete Left  Result Date: 03/24/2017 CLINICAL DATA:  Nonhealing ulcer fourth digit EXAM: LEFT FOOT - COMPLETE 3+ VIEW COMPARISON:  None. FINDINGS: Frontal, oblique, and lateral views were obtained. There is marked generalized soft tissue swelling. There is no evident fracture or dislocation. There is no appreciable joint space narrowing. No erosive change or bony destruction. No soft tissue air or radiopaque foreign body. IMPRESSION: Marked soft tissue edema. No erosive change or bony  destruction evident. No fracture or dislocation. No soft tissue abscess apparent by radiography. If there remains concern for potential osteomyelitis, nuclear medicine three-phase bone scan or MRI of the left foot pre and post-contrast be helpful to further assess. Electronically Signed   By: Bretta Bang III M.D.   On: 03/24/2017 12:52        Scheduled Meds: . aspirin  81 mg Oral Daily  . atorvastatin  20 mg Oral q1800  . doxycycline  100 mg Oral Q12H  . furosemide  40 mg Oral BID  . gabapentin  600 mg Oral TID  . heparin  5,000 Units Subcutaneous Q8H  . hydrocerin   Topical Daily  . magnesium oxide  400 mg Oral BID  . mupirocin cream   Topical Daily  . nortriptyline  30 mg Oral QHS  . potassium chloride  20 mEq Oral BID  . sodium chloride flush  3 mL Intravenous Q12H  . traZODone  50 mg Oral QHS   Continuous Infusions: . piperacillin-tazobactam (ZOSYN)  IV 3.375 g (03/26/17 0601)     LOS: 5 days    Time spent: 35 minutes.     Alba Cory, MD [facility] Pager [phone]  If 7PM-7AM, please contact night-coverage [email or web address] Password [email or web address] 03/26/2017, 10:37 AM

## 2017-03-27 LAB — CBC
HCT: 41.6 % (ref 39.0–52.0)
Hemoglobin: 13.4 g/dL (ref 13.0–17.0)
MCH: 32.2 pg (ref 26.0–34.0)
MCHC: 32.2 g/dL (ref 30.0–36.0)
MCV: 100 fL (ref 78.0–100.0)
PLATELETS: 337 10*3/uL (ref 150–400)
RBC: 4.16 MIL/uL — AB (ref 4.22–5.81)
RDW: 13.6 % (ref 11.5–15.5)
WBC: 8.7 10*3/uL (ref 4.0–10.5)

## 2017-03-27 LAB — BASIC METABOLIC PANEL
ANION GAP: 6 (ref 5–15)
BUN: 11 mg/dL (ref 6–20)
CO2: 36 mmol/L — ABNORMAL HIGH (ref 22–32)
Calcium: 8.6 mg/dL — ABNORMAL LOW (ref 8.9–10.3)
Chloride: 95 mmol/L — ABNORMAL LOW (ref 101–111)
Creatinine, Ser: 0.95 mg/dL (ref 0.61–1.24)
Glucose, Bld: 135 mg/dL — ABNORMAL HIGH (ref 65–99)
POTASSIUM: 4.2 mmol/L (ref 3.5–5.1)
SODIUM: 137 mmol/L (ref 135–145)

## 2017-03-27 LAB — MAGNESIUM: MAGNESIUM: 1.9 mg/dL (ref 1.7–2.4)

## 2017-03-27 MED ORDER — VANCOMYCIN HCL 10 G IV SOLR
2500.0000 mg | Freq: Once | INTRAVENOUS | Status: AC
  Administered 2017-03-27: 2500 mg via INTRAVENOUS
  Filled 2017-03-27: qty 2500

## 2017-03-27 MED ORDER — VANCOMYCIN HCL 10 G IV SOLR
1250.0000 mg | Freq: Two times a day (BID) | INTRAVENOUS | Status: DC
  Administered 2017-03-27 – 2017-03-29 (×4): 1250 mg via INTRAVENOUS
  Filled 2017-03-27 (×4): qty 1250

## 2017-03-27 NOTE — Progress Notes (Signed)
Pharmacy Antibiotic Note  [name] is a 61 y.o. male admitted on 03/21/2017 with cellulitis. Hx of obesity, OSA on CPAP, hypertension, chronic diastolic CHF, and chronic venous stasis, now presented to the ED for evaluation of swelling, pain, and redness involving the right lower extremity, malaise, and generalized weakness.  Pharmacy has been consulted for vancomycin dosing.  Scr 0.77, CrCl ~ 1800mls/min (N)  Plan: Vancomycin 2500mg  IV x 1 then vancomycin 1250mg  IV q12h Continue zosyn 3.375mg  IV q8h Daily Scr Follow renal function and obtain vanc trough at steady state  Height: 6\' 1"  (185.4 cm) Weight: (!) 342 lb 11.2 oz (155.4 kg) IBW/kg (Calculated) : 79.9  Temp (24hrs), Avg:98.1 F (36.7 C), Min:97.8 F (36.6 C), Max:98.4 F (36.9 C)   Recent Labs Lab 03/21/17 1806 03/21/17 1815 03/21/17 2020 03/21/17 2313 03/22/17 0315 03/23/17 0340 03/24/17 0512 03/25/17 0541 03/26/17 0539  WBC 18.8*  --   --   --  16.6* 12.3*  --  11.4*  --   CREATININE 1.67*  --   --   --  1.20 0.95 0.86 0.79 0.77  LATICACIDVEN  --  2.98* 1.9 1.7  --   --   --   --   --     Estimated Creatinine Clearance: 149.1 mL/min (by C-G formula based on SCr of 0.77 mg/dL).    No Known Allergies  Antimicrobials this admission: 6/4 vanc >>  6/5 >>resumed 6/10 >> 6/4 zosyn >>   Dose adjustments this admission:   Microbiology results: 6/4 BCx x2: ngtd 6/4 MRSA PCR: neg 6/5 HIV Ab: neg Thank you for allowing pharmacy to be a part of this patient's care.  Arley Phenixllen Xzaiver Vayda RPh 03/27/2017, 11:09 AM Pager [phone]

## 2017-03-27 NOTE — Procedures (Signed)
Patient refused CPAP.

## 2017-03-27 NOTE — Progress Notes (Signed)
PROGRESS NOTE    [name]  WUJ:[id number] DOB: October 10, 1956 DOA: 03/21/2017 PCP: Ileana Ladd, MD    Brief Narrative: 61 y.o.malewith medical history significant forobesity, OSA on CPAP, hypertension, chronic diastolic CHF, and chronic venous stasis, now presenting to the emergency department for evaluation of swelling, pain, and redness involving the right lower extremity, malaise, and generalized weakness. he became acutely lightheaded upon standing, felt to the ground without losing consciousness or hitting his head, and called EMS out for evaluation. Patient was found to be tachycardic with blood pressure 72/46. He was given 2 L of normal saline en route to the hospital.   Assessment & Plan:   Principal Problem:   Sepsis due to cellulitis [facility]) Active Problems:   Essential hypertension   Chronic diastolic CHF (congestive heart failure) (HCC)   Renal insufficiency   Cellulitis of right lower extremity   Bilateral LE cellulitis on superimpose lymphedema.  Continue with IV zosyn. Redness is not better, will start vancomycin.  Continue with lasix.  Needs referral to lymphedema clinic.  Xray left foot negative for osteo, unable to perform MRI due to patient 's weight. Discussed with radiologist, bone scan or ct wont provide that much information. Plan is to repeat x ray in couple of week.  Doppler negative,.  Appreciate wound care recommendations.   2-Sepsis; secondary to cellulitis. presented on admission with hypotension sbp in the 70's , tachypnea rr 31, sinus tachycardia, heart rate 104, leukocytosis, wbc 18.8, lactic acidosis, Improved.  On IV antibiotics.  MRSA screening negative Blood culture no growth to date,   3-BL LE edema, chronic diastolic HF;  Continue with lasix.   Acute bronchitis; improved Lung no significant wheezing.  Continue with  nebulizer,  Chest x ray negative for PNA  Hypokalemia, hypomagnesemia;  Resolved.  Repeat labs in am.   AKI;  improved with fluids.  Cr peak to 1.6.  Monitor on lasix.   HTN: home bp meds held due to sepsis  Morbid obesity/OSA.Continue nightly cpap  Body mass index is 45.4 kg/m.   DVT prophylaxis: heparin  Code Status: full code.  Family Communication: care discussed with patient.  Disposition Plan: home in 24 to 48 hours.    Consultants:   none   Procedures: doppler negative for DVT   Antimicrobials:   Zosyn    Subjective: He denies cough, dyspnea.  Swelling same. Redness left leg is better. Right leg is still very red  Objective: Vitals:   03/26/17 0701 03/26/17 1500 03/26/17 2054 03/27/17 0525  BP: 140/90 129/67 129/73 (!) 136/52  Pulse: 100 96 96 95  Resp: 20 20 20 20   Temp: 98.3 F (36.8 C) 98.4 F (36.9 C) 97.8 F (36.6 C) 98 F (36.7 C)  TempSrc: Oral Oral Oral Oral  SpO2: 97% 100% 100% 97%  Weight:    (!) 155.4 kg (342 lb 11.2 oz)  Height:        Intake/Output Summary (Last 24 hours) at 03/27/17 1343 Last data filed at 03/27/17 1000  Gross per 24 hour  Intake             2350 ml  Output             3200 ml  Net             -850 ml   Filed Weights   03/25/17 1400 03/26/17 0544 03/27/17 0525  Weight: (!) 155.2 kg (342 lb 2.5 oz) (!) 156.8 kg (345 lb 10.9 oz) (!) 155.4  kg (342 lb 11.2 oz)    Examination:  General exam: NAD Respiratory system: CTA, normal respiratory effort.  Cardiovascular system: S 1, S 2 RRR, no JVD Gastrointestinal system: Obese, distended, nt  Central nervous system ; non focal.  Extremities: symmetric power.  Skin: Bilateral LE with lymphedema, left with very thick skin and black hyperpigmentation. Redness right LE persist not better      Data Reviewed: I have personally reviewed following labs and imaging studies  CBC:  Recent Labs Lab 03/21/17 1806 03/22/17 0315 03/23/17 0340 03/25/17 0541 03/27/17 1053  WBC 18.8* 16.6* 12.3* 11.4* 8.7  NEUTROABS 15.9* 13.2* 8.7*  --   --   HGB 12.6* 12.7* 12.4* 13.1 13.4    HCT 37.4* 38.0* 38.3* 40.8 41.6  MCV 95.9 96.0 97.7 99.0 100.0  PLT 200 205 226 270 337   Basic Metabolic Panel:  Recent Labs Lab 03/23/17 0340 03/24/17 0512 03/25/17 0541 03/26/17 0539 03/27/17 0731 03/27/17 1053  NA 136 137 138 136  --  137  K 3.2* 3.2* 3.8 3.7  --  4.2  CL 104 101 98* 98*  --  95*  CO2 27 29 31  32  --  36*  GLUCOSE 131* 110* 116* 129*  --  135*  BUN 14 11 11 10   --  11  CREATININE 0.95 0.86 0.79 0.77  --  0.95  CALCIUM 7.5* 7.8* 8.2* 8.1*  --  8.6*  MG 1.6*  --  1.5*  --  1.9  --    GFR: Estimated Creatinine Clearance: 125.6 mL/min (by C-G formula based on SCr of 0.95 mg/dL). Liver Function Tests:  Recent Labs Lab 03/21/17 1806 03/22/17 0315  AST 35 33  ALT 26 28  ALKPHOS 37* 40  BILITOT 1.7* 1.2  PROT 6.6 6.3*  ALBUMIN 2.8* 2.6*   No results for input(s): LIPASE, AMYLASE in the last 168 hours. No results for input(s): AMMONIA in the last 168 hours. Coagulation Profile:  Recent Labs Lab 03/21/17 2020  INR 1.42   Cardiac Enzymes: No results for input(s): CKTOTAL, CKMB, CKMBINDEX, TROPONINI in the last 168 hours. BNP (last 3 results) No results for input(s): PROBNP in the last 8760 hours. HbA1C: No results for input(s): HGBA1C in the last 72 hours. CBG: No results for input(s): GLUCAP in the last 168 hours. Lipid Profile: No results for input(s): CHOL, HDL, LDLCALC, TRIG, CHOLHDL, LDLDIRECT in the last 72 hours. Thyroid Function Tests: No results for input(s): TSH, T4TOTAL, FREET4, T3FREE, THYROIDAB in the last 72 hours. Anemia Panel: No results for input(s): VITAMINB12, FOLATE, FERRITIN, TIBC, IRON, RETICCTPCT in the last 72 hours. Sepsis Labs:  Recent Labs Lab 03/21/17 1815 03/21/17 2020 03/21/17 2313  PROCALCITON  --  3.67  --   LATICACIDVEN 2.98* 1.9 1.7    Recent Results (from the past 240 hour(s))  Blood Culture (routine x 2)     Status: None   Collection Time: 03/21/17  6:09 PM  Result Value Ref Range Status    Specimen Description LEFT ANTECUBITAL  Final   Special Requests   Final    BOTTLES DRAWN AEROBIC AND ANAEROBIC Blood Culture adequate volume   Culture   Final    NO GROWTH 5 DAYS Performed at [facility], [address] [place], [place] [address]    Report Status 03/26/2017 FINAL  Final  Blood Culture (routine x 2)     Status: None   Collection Time: 03/21/17  7:01 PM  Result Value Ref Range  Status   Specimen Description BLOOD RIGHT ANTECUBITAL  Final   Special Requests   Final    BOTTLES DRAWN AEROBIC AND ANAEROBIC Blood Culture adequate volume   Culture   Final    NO GROWTH 5 DAYS Performed at [facility], [address] [place], [place] [address]    Report Status 03/26/2017 FINAL  Final  MRSA PCR Screening     Status: None   Collection Time: 03/21/17  9:30 PM  Result Value Ref Range Status   MRSA by PCR NEGATIVE NEGATIVE Final    Comment:        The GeneXpert MRSA Assay (FDA approved for NASAL specimens only), is one component of a comprehensive MRSA colonization surveillance program. It is not intended to diagnose MRSA infection nor to guide or monitor treatment for MRSA infections.          Radiology Studies: No results found.      Scheduled Meds: . aspirin  81 mg Oral Daily  . atorvastatin  20 mg Oral q1800  . furosemide  40 mg Oral BID  . gabapentin  600 mg Oral TID  . heparin  5,000 Units Subcutaneous Q8H  . hydrocerin   Topical Daily  . magnesium oxide  400 mg Oral BID  . mupirocin cream   Topical Daily  . nortriptyline  30 mg Oral QHS  . potassium chloride  20 mEq Oral BID  . sodium chloride flush  3 mL Intravenous Q12H  . traZODone  50 mg Oral QHS   Continuous Infusions: . piperacillin-tazobactam (ZOSYN)  IV 3.375 g (03/27/17 1236)  . [START ON 03/28/2017] vancomycin    . vancomycin 2,500 mg (03/27/17 1237)     LOS: 6 days    Time spent: 35 minutes.     Alba Cory, MD [facility] Pager  [phone]  If 7PM-7AM, please contact night-coverage [email or web address] Password [email or web address] 03/27/2017, 1:43 PM

## 2017-03-28 ENCOUNTER — Ambulatory Visit: Payer: Medicare Other | Admitting: Adult Health

## 2017-03-28 LAB — BASIC METABOLIC PANEL
Anion gap: 7 (ref 5–15)
BUN: 11 mg/dL (ref 6–20)
CHLORIDE: 99 mmol/L — AB (ref 101–111)
CO2: 33 mmol/L — AB (ref 22–32)
Calcium: 8.3 mg/dL — ABNORMAL LOW (ref 8.9–10.3)
Creatinine, Ser: 0.73 mg/dL (ref 0.61–1.24)
GFR calc Af Amer: >60 mL/min (ref >60)
GFR calc non Af Amer: >60 mL/min (ref >60)
Glucose, Bld: 123 mg/dL — ABNORMAL HIGH (ref 65–99)
POTASSIUM: 3.8 mmol/L (ref 3.5–5.1)
SODIUM: 139 mmol/L (ref 135–145)

## 2017-03-28 NOTE — Progress Notes (Signed)
PROGRESS NOTE    [name]  ZOX:[id number] DOB: 30-Sep-1956 DOA: 03/21/2017 PCP: Ileana Ladd, MD    Brief Narrative: 61 y.o.malewith medical history significant forobesity, OSA on CPAP, hypertension, chronic diastolic CHF, and chronic venous stasis, now presenting to the emergency department for evaluation of swelling, pain, and redness involving the right lower extremity, malaise, and generalized weakness. he became acutely lightheaded upon standing, felt to the ground without losing consciousness or hitting his head, and called EMS out for evaluation. Patient was found to be tachycardic with blood pressure 72/46. He was given 2 L of normal saline en route to the hospital.   Assessment & Plan:   Principal Problem:   Sepsis due to cellulitis [facility]) Active Problems:   Essential hypertension   Chronic diastolic CHF (congestive heart failure) (HCC)   Renal insufficiency   Cellulitis of right lower extremity   Bilateral LE cellulitis on superimpose lymphedema.  Continue with zosyn and 2 day of vancomycin.  Continue with lasix.  Needs referral to lymphedema clinic.  Xray left foot negative for osteo, unable to perform MRI due to patient 's weight. Discussed with radiologist, bone scan or ct wont provide that much information. Plan is to repeat x ray in couple of week.  Doppler negative,.  Appreciate wound care recommendations.  Reevaluate in am.   2-Sepsis; secondary to cellulitis. presented on admission with hypotension sbp in the 70's , tachypnea rr 31, sinus tachycardia, heart rate 104, leukocytosis, wbc 18.8, lactic acidosis, Improved.  On IV antibiotics.  MRSA screening negative Blood culture no growth to date,   3-BL LE edema, chronic diastolic HF;  Continue with lasix.   Acute bronchitis; improved Lung no significant wheezing.  Continue with  nebulizer,  Chest x ray negative for PNA  Hypokalemia, hypomagnesemia;  Resolved.  Repeat labs in am.   AKI; improved  with fluids.  Cr peak to 1.6.  Monitor on lasix.   HTN: home bp meds held due to sepsis  Morbid obesity/OSA.Continue nightly cpap  Body mass index is 45.4 kg/m.   DVT prophylaxis: heparin  Code Status: full code.  Family Communication: care discussed with patient.  Disposition Plan: home in 24 to 48 hours.    Consultants:   none   Procedures: doppler negative for DVT   Antimicrobials:   Zosyn    Subjective: Right leg less red  Objective: Vitals:   03/27/17 1500 03/27/17 2153 03/28/17 0700 03/28/17 1409  BP: 126/71 122/77 (!) 110/53 109/70  Pulse: 91 99 86 96  Resp: 18 18 17 18   Temp: 98 F (36.7 C) 98 F (36.7 C) 97.5 F (36.4 C) 98.2 F (36.8 C)  TempSrc: Oral Oral Oral Oral  SpO2: 96% 100% 100% 97%  Weight:   (!) 160.7 kg (354 lb 4.8 oz)   Height:        Intake/Output Summary (Last 24 hours) at 03/28/17 1557 Last data filed at 03/28/17 1256  Gross per 24 hour  Intake             1310 ml  Output             1275 ml  Net               35 ml   Filed Weights   03/26/17 0544 03/27/17 0525 03/28/17 0700  Weight: (!) 156.8 kg (345 lb 10.9 oz) (!) 155.4 kg (342 lb 11.2 oz) (!) 160.7 kg (354 lb 4.8 oz)    Examination:  General  exam: NAD Respiratory system: CTA Cardiovascular system: S 1, S 2 RRR Gastrointestinal system: Obese, distended, nt  Central nervous system ; non focal.  Extremities: symmetric power.  Skin: Bilateral lymphedema, chronic edema and hyperpigmentation left leg, right leg with edema and redness.     Data Reviewed: I have personally reviewed following labs and imaging studies  CBC:  Recent Labs Lab 03/21/17 1806 03/22/17 0315 03/23/17 0340 03/25/17 0541 03/27/17 1053  WBC 18.8* 16.6* 12.3* 11.4* 8.7  NEUTROABS 15.9* 13.2* 8.7*  --   --   HGB 12.6* 12.7* 12.4* 13.1 13.4  HCT 37.4* 38.0* 38.3* 40.8 41.6  MCV 95.9 96.0 97.7 99.0 100.0  PLT 200 205 226 270 337   Basic Metabolic Panel:  Recent Labs Lab  03/23/17 0340 03/24/17 0512 03/25/17 0541 03/26/17 0539 03/27/17 0731 03/27/17 1053 03/28/17 0529  NA 136 137 138 136  --  137 139  K 3.2* 3.2* 3.8 3.7  --  4.2 3.8  CL 104 101 98* 98*  --  95* 99*  CO2 27 29 31  32  --  36* 33*  GLUCOSE 131* 110* 116* 129*  --  135* 123*  BUN 14 11 11 10   --  11 11  CREATININE 0.95 0.86 0.79 0.77  --  0.95 0.73  CALCIUM 7.5* 7.8* 8.2* 8.1*  --  8.6* 8.3*  MG 1.6*  --  1.5*  --  1.9  --   --    GFR: Estimated Creatinine Clearance: 151.9 mL/min (by C-G formula based on SCr of 0.73 mg/dL). Liver Function Tests:  Recent Labs Lab 03/21/17 1806 03/22/17 0315  AST 35 33  ALT 26 28  ALKPHOS 37* 40  BILITOT 1.7* 1.2  PROT 6.6 6.3*  ALBUMIN 2.8* 2.6*   No results for input(s): LIPASE, AMYLASE in the last 168 hours. No results for input(s): AMMONIA in the last 168 hours. Coagulation Profile:  Recent Labs Lab 03/21/17 2020  INR 1.42   Cardiac Enzymes: No results for input(s): CKTOTAL, CKMB, CKMBINDEX, TROPONINI in the last 168 hours. BNP (last 3 results) No results for input(s): PROBNP in the last 8760 hours. HbA1C: No results for input(s): HGBA1C in the last 72 hours. CBG: No results for input(s): GLUCAP in the last 168 hours. Lipid Profile: No results for input(s): CHOL, HDL, LDLCALC, TRIG, CHOLHDL, LDLDIRECT in the last 72 hours. Thyroid Function Tests: No results for input(s): TSH, T4TOTAL, FREET4, T3FREE, THYROIDAB in the last 72 hours. Anemia Panel: No results for input(s): VITAMINB12, FOLATE, FERRITIN, TIBC, IRON, RETICCTPCT in the last 72 hours. Sepsis Labs:  Recent Labs Lab 03/21/17 1815 03/21/17 2020 03/21/17 2313  PROCALCITON  --  3.67  --   LATICACIDVEN 2.98* 1.9 1.7    Recent Results (from the past 240 hour(s))  Blood Culture (routine x 2)     Status: None   Collection Time: 03/21/17  6:09 PM  Result Value Ref Range Status   Specimen Description LEFT ANTECUBITAL  Final   Special Requests   Final    BOTTLES  DRAWN AEROBIC AND ANAEROBIC Blood Culture adequate volume   Culture   Final    NO GROWTH 5 DAYS Performed at [facility], [address] [place], [place] [address]    Report Status 03/26/2017 FINAL  Final  Blood Culture (routine x 2)     Status: None   Collection Time: 03/21/17  7:01 PM  Result Value Ref Range Status   Specimen Description BLOOD RIGHT ANTECUBITAL  Final  Special Requests   Final    BOTTLES DRAWN AEROBIC AND ANAEROBIC Blood Culture adequate volume   Culture   Final    NO GROWTH 5 DAYS Performed at [facility], [address], [place], [place] [address]    Report Status 03/26/2017 FINAL  Final  MRSA PCR Screening     Status: None   Collection Time: 03/21/17  9:30 PM  Result Value Ref Range Status   MRSA by PCR NEGATIVE NEGATIVE Final    Comment:        The GeneXpert MRSA Assay (FDA approved for NASAL specimens only), is one component of a comprehensive MRSA colonization surveillance program. It is not intended to diagnose MRSA infection nor to guide or monitor treatment for MRSA infections.          Radiology Studies: No results found.      Scheduled Meds: . aspirin  81 mg Oral Daily  . atorvastatin  20 mg Oral q1800  . furosemide  40 mg Oral BID  . gabapentin  600 mg Oral TID  . heparin  5,000 Units Subcutaneous Q8H  . hydrocerin   Topical Daily  . magnesium oxide  400 mg Oral BID  . mupirocin cream   Topical Daily  . nortriptyline  30 mg Oral QHS  . potassium chloride  20 mEq Oral BID  . sodium chloride flush  3 mL Intravenous Q12H  . traZODone  50 mg Oral QHS   Continuous Infusions: . piperacillin-tazobactam (ZOSYN)  IV Stopped (03/28/17 1542)  . vancomycin Stopped (03/28/17 1312)     LOS: 7 days    Time spent: 35 minutes.     Alba Cory, MD [facility] Pager [phone]  If 7PM-7AM, please contact night-coverage [email or web address] Password [facility] 03/28/2017, 3:57 PM

## 2017-03-28 NOTE — Progress Notes (Signed)
Pt refuses CPAP  RT to monitor and assess as needed.  

## 2017-03-28 NOTE — Progress Notes (Signed)
Physical Therapy Treatment Patient Details Name: [name] MRN: [id number] DOB: 09/26/1956 Today's Date: 03/28/2017    History of Present Illness 61 y.o. male with medical history significant for obesity, OSA on CPAP, hydrocephalus, peripheral neuropathy, hypertension, chronic diastolic CHF, and chronic venous stasis and admitted with sepsis due to bilateral LE cellulitis    PT Comments    Pt tolerated ambulating around unit very well.  Pt hopeful for d/c home soon.  Follow Up Recommendations  Home health PT     Equipment Recommendations  Rolling walker with 5" wheels    Recommendations for Other Services       Precautions / Restrictions Precautions Precautions: None    Mobility  Bed Mobility Overal bed mobility: Needs Assistance Bed Mobility: Supine to Sit     Supine to sit: Supervision;HOB elevated        Transfers Overall transfer level: Needs assistance Equipment used: Rolling walker (2 wheeled) Transfers: Sit to/from Stand Sit to Stand: Min assist         General transfer comment: assist to rise and steady  Ambulation/Gait Ambulation/Gait assistance: Min guard;Supervision Ambulation Distance (Feet): 400 Feet Assistive device: Rolling walker (2 wheeled) Gait Pattern/deviations: Step-through pattern;Decreased stride length;Trunk flexed     General Gait Details: chronic back pain, able to tolerate good distance   Stairs            Wheelchair Mobility    Modified Rankin (Stroke Patients Only)       Balance                                            Cognition Arousal/Alertness: Awake/alert Behavior During Therapy: WFL for tasks assessed/performed Overall Cognitive Status: Within Functional Limits for tasks assessed                                        Exercises      General Comments        Pertinent Vitals/Pain Pain Assessment: No/denies pain    Home Living                       Prior Function            PT Goals (current goals can now be found in the care plan section) Progress towards PT goals: Progressing toward goals    Frequency    Min 3X/week      PT Plan Current plan remains appropriate    Co-evaluation              AM-PAC PT "6 Clicks" Daily Activity  Outcome Measure  Difficulty turning over in bed (including adjusting bedclothes, sheets and blankets)?: None Difficulty moving from lying on back to sitting on the side of the bed? : None Difficulty sitting down on and standing up from a chair with arms (e.g., wheelchair, bedside commode, etc,.)?: A Little Help needed moving to and from a bed to chair (including a wheelchair)?: A Little Help needed walking in hospital room?: A Little Help needed climbing 3-5 steps with a railing? : A Little 6 Click Score: 20    End of Session   Activity Tolerance: Patient tolerated treatment well Patient left: in chair;with call bell/phone within reach   PT Visit Diagnosis: Difficulty in walking,  not elsewhere classified (R26.2)     Time: [id number] PT Time Calculation (min) (ACUTE ONLY): 22 min  Charges:  $Gait Training: 8-22 mins                    G Codes:       Zenovia JarredKati Joi Leyva, PT, DPT 03/28/2017 Pager: [phone]   Maida SaleLEMYRE,KATHrine E 03/28/2017, 1:05 PM

## 2017-03-29 LAB — CREATININE, SERUM
CREATININE: 0.76 mg/dL (ref 0.61–1.24)
GFR calc non Af Amer: >60 mL/min (ref >60)

## 2017-03-29 MED ORDER — DOXYCYCLINE HYCLATE 100 MG PO TABS
100.0000 mg | ORAL_TABLET | Freq: Two times a day (BID) | ORAL | Status: DC
  Administered 2017-03-29: 100 mg via ORAL

## 2017-03-29 MED ORDER — CIPROFLOXACIN HCL 500 MG PO TABS: 500.0000 mg | ORAL_TABLET | Freq: Two times a day (BID) | ORAL | 0 refills | Status: AC

## 2017-03-29 MED ORDER — HYDROCERIN EX CREA: 1.0000 "application " | TOPICAL_CREAM | Freq: Every day | CUTANEOUS | 0 refills | Status: AC

## 2017-03-29 MED ORDER — DOXYCYCLINE HYCLATE 100 MG PO TABS: 100.0000 mg | ORAL_TABLET | Freq: Two times a day (BID) | ORAL | 0 refills | Status: DC

## 2017-03-29 NOTE — Progress Notes (Signed)
03/29/17  1500  Reviewed discharge instructions with patient. Patient verbalized understanding of discharge instructions. Copy of discharge instructions and prescriptions given to patient.

## 2017-03-29 NOTE — Discharge Summary (Signed)
Physician Discharge Summary  Caroll Rancherdmund L Gambone ZOX:[id number] DOB: 03/17/1956 DOA: 03/21/2017  PCP: Ileana LaddWong, Francis P, MD  Admit date: 03/21/2017 Discharge date: 03/29/2017  Admitted From: Home  Disposition: home  Recommendations for Outpatient Follow-up:  1. Follow up with PCP in 1-2 weeks 2. Please obtain BMP/CBC in one week 3. Follow up arrange for wound center. Would benefit for referral for lymphedema clinic.   Home Health: yes.   Discharge Condition: stable.  CODE STATUS: full code.  Diet recommendation: Carb Modified  Brief/Interim Summary: 61 y.o.malewith medical history significant forobesity, OSA on CPAP, hypertension, chronic diastolic CHF, and chronic venous stasis, now presenting to the emergency department for evaluation of swelling, pain, and redness involving the right lower extremity, malaise, and generalized weakness. he became acutely lightheaded upon standing, felt to the ground without losing consciousness or hitting his head, and called EMS out for evaluation. Patient was found to be tachycardic with blood pressure 72/46. He was given 2 L of normal saline en route to the hospital.   Assessment & Plan:   Principal Problem:   Sepsis due to cellulitis [facility](HCC) Active Problems:   Essential hypertension   Chronic diastolic CHF (congestive heart failure) (HCC)   Renal insufficiency   Cellulitis of right lower extremity   Bilateral LE cellulitis on superimpose lymphedema.  Continue with zosyn and 2 day of vancomycin.  Continue with lasix.  Needs referral to lymphedema clinic.  Xray left foot negative for osteo, unable to perform MRI due to patient 's weight. Discussed with radiologist, bone scan or ct wont provide that much information. Plan is to repeat x ray in couple of week.  Doppler negative,.  Appreciate wound care recommendations.  Redness on the right improved. He will be discharge on cipro and doxy for 7 more days.  Needs to follow up with wound center. Benefit  form lymphedema clinic.   2-Sepsis; secondary to cellulitis. presented on admission with hypotension sbp in the 70's , tachypnea rr 31, sinus tachycardia, heart rate 104, leukocytosis, wbc 18.8, lactic acidosis, Resolved.  On IV antibiotics.  MRSA screening negative Blood culture no growth to date,   3-BL LE edema, chronic diastolic HF;  Continue with lasix.   Acute bronchitis; improved Lung no significant wheezing.  Continue with  nebulizer,  Chest x ray negative for PNA  Hypokalemia, hypomagnesemia;  Resolved.  Repeat labs in am.   AKI; improved with fluids.  Cr peak to 1.6.  Monitor on lasix.   HTN: home bp meds held due to sepsis  Morbid obesity/OSA.Continue nightly cpap  Body mass index is 45.4 kg/m.  Discharge Diagnoses:  Principal Problem:   Sepsis due to cellulitis [facility](HCC) Active Problems:   Essential hypertension   Chronic diastolic CHF (congestive heart failure) (HCC)   Renal insufficiency   Cellulitis of right lower extremity    Discharge Instructions  Discharge Instructions    Diet - low sodium heart healthy    Complete by:  As directed    Diet - low sodium heart healthy    Complete by:  As directed    Increase activity slowly    Complete by:  As directed    Increase activity slowly    Complete by:  As directed      Allergies as of 03/29/2017   No Known Allergies     Medication List    STOP taking these medications   cephALEXin 500 MG capsule Commonly known as:  KEFLEX     TAKE these medications  ammonium lactate 12 % cream Commonly known as:  AMLACTIN Apply 1 application topically at bedtime as needed for dry skin.   aspirin 81 MG tablet Take 81 mg by mouth daily.   atorvastatin 20 MG tablet Commonly known as:  LIPITOR Take 20 mg by mouth daily.   ciprofloxacin 500 MG tablet Commonly known as:  CIPRO Take 1 tablet (500 mg total) by mouth 2 (two) times daily.   diclofenac 75 MG EC tablet Commonly known as:   VOLTAREN TAKE 1 TABLET BY MOUTH 2  TIMES DAILY WITH A MEAL   doxycycline 100 MG tablet Commonly known as:  VIBRA-TABS Take 1 tablet (100 mg total) by mouth every 12 (twelve) hours.   furosemide 40 MG tablet Commonly known as:  LASIX Take 40 mg by mouth 2 (two) times daily.   gabapentin 300 MG capsule Commonly known as:  NEURONTIN Take 1 capsule (300 mg total) by mouth 3 (three) times daily. What changed:  how much to take   hydrocerin Crea Apply 1 application topically daily. Start taking on:  03/30/2017   HYDROcodone-acetaminophen 7.5-325 MG tablet Commonly known as:  NORCO Take 1 tablet by mouth every 6 (six) hours as needed for moderate pain (Must last 28 days).   nortriptyline 10 MG capsule Commonly known as:  PAMELOR TAKE 3 CAPSULES (30 MG TOTAL) BY MOUTH EVERY EVENING.   ramipril 10 MG capsule Commonly known as:  ALTACE Take 10 mg by mouth 2 (two) times daily.   tiZANidine 4 MG tablet Commonly known as:  ZANAFLEX Take 1 tablet by mouth 3  times daily   traZODone 50 MG tablet Commonly known as:  DESYREL Take 1 tablet by mouth at bedtime.   triamcinolone cream 0.1 % Commonly known as:  KENALOG Apply 1 application topically daily as needed. For rash            Durable Medical Equipment        Start     Ordered   03/29/17 1346  For home use only DME Walker rolling  Once    Question:  Patient needs a walker to treat with the following condition  Answer:  Balance problems   03/29/17 1346   03/29/17 1123  For home use only DME Walker rolling  Once    Question:  Patient needs a walker to treat with the following condition  Answer:  Balance disorder   03/29/17 1122     Follow-up Information    [facility]              Follow up on 04/08/2017.   Why:  Appointment at 8:00 AM, however you need to be there at 7:50 AM. Please try to keep this appointment. Contact information: [address] [place]  [place] [phone]       Ileana Ladd, MD Follow up in 1 week(s).   Specialty:  Family Medicine Contact information: [address] [place] [address] [phone]          No Known Allergies  Consultations:  none   Procedures/Studies: Dg Chest Port 1 View  Result Date: 03/24/2017 CLINICAL DATA:  Shortness of breath.  Hypertension. EXAM: PORTABLE CHEST 1 VIEW COMPARISON:  March 21, 2017 FINDINGS: There is no edema or consolidation. Heart size and pulmonary vascularity are normal. No adenopathy. No bone lesions. There are surgical clips in the left axillary region. IMPRESSION: No edema or consolidation. Electronically Signed   By:  Bretta Bang III M.D.   On: 03/24/2017 12:48   Dg Chest Port 1 View  Result Date: 03/21/2017 CLINICAL DATA:  Shortness of breath. Hypotension. Tachycardia. Patient fell today. EXAM: PORTABLE CHEST 1 VIEW COMPARISON:  12/01/2009 FINDINGS: The heart size and mediastinal contours are within normal limits. Both lungs are clear. The visualized skeletal structures are unremarkable. IMPRESSION: No active disease. Electronically Signed   By: Francene Boyers M.D.   On: 03/21/2017 18:33   Dg Foot Complete Left  Result Date: 03/24/2017 CLINICAL DATA:  Nonhealing ulcer fourth digit EXAM: LEFT FOOT - COMPLETE 3+ VIEW COMPARISON:  None. FINDINGS: Frontal, oblique, and lateral views were obtained. There is marked generalized soft tissue swelling. There is no evident fracture or dislocation. There is no appreciable joint space narrowing. No erosive change or bony destruction. No soft tissue air or radiopaque foreign body. IMPRESSION: Marked soft tissue edema. No erosive change or bony destruction evident. No fracture or dislocation. No soft tissue abscess apparent by radiography. If there remains concern for potential osteomyelitis, nuclear medicine three-phase bone scan or MRI of the left foot pre and post-contrast be helpful to further assess.  Electronically Signed   By: Bretta Bang III M.D.   On: 03/24/2017 12:52   (Echo, Carotid, EGD, Colonoscopy, ERCP)    Subjective:   Discharge Exam: Vitals:   03/29/17 0632 03/29/17 0929  BP: 128/75 123/68  Pulse: 87 99  Resp: 17 20  Temp: 97.8 F (36.6 C)    Vitals:   03/28/17 1921 03/28/17 2136 03/29/17 0632 03/29/17 0929  BP: 125/72 128/65 128/75 123/68  Pulse: 90 100 87 99  Resp: 18 18 17 20   Temp: 98.6 F (37 C) 98.4 F (36.9 C) 97.8 F (36.6 C)   TempSrc: Oral Oral Oral   SpO2: 98% 97% 97% 100%  Weight:   (!) 156.1 kg (344 lb 2.2 oz)   Height:        General: Pt is alert, awake, not in acute distress Cardiovascular: RRR, S1/S2 +, no rubs, no gallops Respiratory: CTA bilaterally, no wheezing, no rhonchi Abdominal: Soft, NT, ND, bowel sounds + Extremities: no edema, no cyanosis    The results of significant diagnostics from this hospitalization (including imaging, microbiology, ancillary and laboratory) are listed below for reference.     Microbiology: Recent Results (from the past 240 hour(s))  Blood Culture (routine x 2)     Status: None   Collection Time: 03/21/17  6:09 PM  Result Value Ref Range Status   Specimen Description LEFT ANTECUBITAL  Final   Special Requests   Final    BOTTLES DRAWN AEROBIC AND ANAEROBIC Blood Culture adequate volume   Culture   Final    NO GROWTH 5 DAYS Performed at [facility], [address], [place], [place] [address]    Report Status 03/26/2017 FINAL  Final  Blood Culture (routine x 2)     Status: None   Collection Time: 03/21/17  7:01 PM  Result Value Ref Range Status   Specimen Description BLOOD RIGHT ANTECUBITAL  Final   Special Requests   Final    BOTTLES DRAWN AEROBIC AND ANAEROBIC Blood Culture adequate volume   Culture   Final    NO GROWTH 5 DAYS Performed at [facility], [address] [place], [place] [address]    Report Status 03/26/2017 FINAL  Final  MRSA PCR Screening     Status:  None   Collection Time: 03/21/17  9:30 PM  Result Value Ref Range Status   MRSA by PCR NEGATIVE NEGATIVE Final    Comment:        The GeneXpert MRSA Assay (FDA approved for NASAL specimens only), is one component of a comprehensive MRSA colonization surveillance program. It is not intended to diagnose MRSA infection nor to guide or monitor treatment for MRSA infections.      Labs: BNP (last 3 results) No results for input(s): BNP in the last 8760 hours. Basic Metabolic Panel:  Recent Labs Lab 03/23/17 0340 03/24/17 0512 03/25/17 0541 03/26/17 0539 03/27/17 0731 03/27/17 1053 03/28/17 0529 03/29/17 0554  NA 136 137 138 136  --  137 139  --   K 3.2* 3.2* 3.8 3.7  --  4.2 3.8  --   CL 104 101 98* 98*  --  95* 99*  --   CO2 27 29 31  32  --  36* 33*  --   GLUCOSE 131* 110* 116* 129*  --  135* 123*  --   BUN 14 11 11 10   --  11 11  --   CREATININE 0.95 0.86 0.79 0.77  --  0.95 0.73 0.76  CALCIUM 7.5* 7.8* 8.2* 8.1*  --  8.6* 8.3*  --   MG 1.6*  --  1.5*  --  1.9  --   --   --    Liver Function Tests: No results for input(s): AST, ALT, ALKPHOS, BILITOT, PROT, ALBUMIN in the last 168 hours. No results for input(s): LIPASE, AMYLASE in the last 168 hours. No results for input(s): AMMONIA in the last 168 hours. CBC:  Recent Labs Lab 03/23/17 0340 03/25/17 0541 03/27/17 1053  WBC 12.3* 11.4* 8.7  NEUTROABS 8.7*  --   --   HGB 12.4* 13.1 13.4  HCT 38.3* 40.8 41.6  MCV 97.7 99.0 100.0  PLT 226 270 337   Cardiac Enzymes: No results for input(s): CKTOTAL, CKMB, CKMBINDEX, TROPONINI in the last 168 hours. BNP: Invalid input(s): POCBNP CBG: No results for input(s): GLUCAP in the last 168 hours. D-Dimer No results for input(s): DDIMER in the last 72 hours. Hgb A1c No results for input(s): HGBA1C in the last 72 hours. Lipid Profile No results for input(s): CHOL, HDL, LDLCALC, TRIG, CHOLHDL, LDLDIRECT in the last 72 hours. Thyroid function studies No results for  input(s): TSH, T4TOTAL, T3FREE, THYROIDAB in the last 72 hours.  Invalid input(s): FREET3 Anemia work up No results for input(s): VITAMINB12, FOLATE, FERRITIN, TIBC, IRON, RETICCTPCT in the last 72 hours. Urinalysis    Component Value Date/Time   COLORURINE YELLOW 03/21/2017 1758   APPEARANCEUR CLOUDY (A) 03/21/2017 1758   LABSPEC 1.009 03/21/2017 1758   PHURINE 5.0 03/21/2017 1758   GLUCOSEU NEGATIVE 03/21/2017 1758   HGBUR MODERATE (A) 03/21/2017 1758   BILIRUBINUR NEGATIVE 03/21/2017 1758   KETONESUR NEGATIVE 03/21/2017 1758   PROTEINUR 30 (A) 03/21/2017 1758   UROBILINOGEN 1.0 12/01/2009 1409   NITRITE NEGATIVE 03/21/2017 1758   LEUKOCYTESUR NEGATIVE 03/21/2017 1758   Sepsis Labs Invalid input(s): PROCALCITONIN,  WBC,  LACTICIDVEN Microbiology Recent Results (from the past 240 hour(s))  Blood Culture (routine x 2)     Status: None   Collection Time: 03/21/17  6:09 PM  Result Value Ref Range Status   Specimen Description LEFT ANTECUBITAL  Final   Special Requests   Final    BOTTLES DRAWN AEROBIC AND ANAEROBIC Blood Culture adequate volume   Culture   Final    NO GROWTH 5 DAYS Performed at  [facility], [address], [place], [place] [address]    Report Status 03/26/2017 FINAL  Final  Blood Culture (routine x 2)     Status: None   Collection Time: 03/21/17  7:01 PM  Result Value Ref Range Status   Specimen Description BLOOD RIGHT ANTECUBITAL  Final   Special Requests   Final    BOTTLES DRAWN AEROBIC AND ANAEROBIC Blood Culture adequate volume   Culture   Final    NO GROWTH 5 DAYS Performed at [facility], [address], [place], [place] [address]    Report Status 03/26/2017 FINAL  Final  MRSA PCR Screening     Status: None   Collection Time: 03/21/17  9:30 PM  Result Value Ref Range Status   MRSA by PCR NEGATIVE NEGATIVE Final    Comment:        The GeneXpert MRSA Assay (FDA approved for NASAL specimens only), is one component of  a comprehensive MRSA colonization surveillance program. It is not intended to diagnose MRSA infection nor to guide or monitor treatment for MRSA infections.      Time coordinating discharge: Over 30 minutes  SIGNED:   Alba Cory, MD  [facility] 03/29/2017, 2:21 PM Pager [phone]  If 7PM-7AM, please contact night-coverage [email or web address] Password [email or web address]

## 2017-03-29 NOTE — Progress Notes (Signed)
[facility] Fri [facility] at 0800. Pt is aware.

## 2017-04-04 ENCOUNTER — Other Ambulatory Visit: Payer: Self-pay

## 2017-04-04 DIAGNOSIS — R600 Localized edema: Secondary | ICD-10-CM

## 2017-04-04 DIAGNOSIS — I872 Venous insufficiency (chronic) (peripheral): Secondary | ICD-10-CM

## 2017-04-04 DIAGNOSIS — I890 Lymphedema, not elsewhere classified: Secondary | ICD-10-CM

## 2017-04-05 ENCOUNTER — Other Ambulatory Visit: Payer: Self-pay | Admitting: Vascular Surgery

## 2017-04-05 DIAGNOSIS — R609 Edema, unspecified: Secondary | ICD-10-CM

## 2017-04-08 ENCOUNTER — Encounter (HOSPITAL_BASED_OUTPATIENT_CLINIC_OR_DEPARTMENT_OTHER): Payer: Medicare Other

## 2017-04-11 ENCOUNTER — Telehealth: Payer: Self-pay | Admitting: Neurology

## 2017-04-11 MED ORDER — HYDROCODONE-ACETAMINOPHEN 7.5-325 MG PO TABS: 1.0000 | ORAL_TABLET | Freq: Four times a day (QID) | ORAL | 0 refills | Status: DC | PRN

## 2017-04-11 NOTE — Telephone Encounter (Signed)
.  Placed printed/signed rx hydrocodone up front for patient pick up.  

## 2017-04-11 NOTE — Telephone Encounter (Signed)
Patient requesting refill of HYDROcodone-acetaminophen (NORCO) 7.5-325 MG tablet. ° ° °

## 2017-04-11 NOTE — Telephone Encounter (Signed)
I will refill the hydrocodone. 

## 2017-04-11 NOTE — Addendum Note (Signed)
Addended by: York SpanielWILLIS, CHARLES K on: 04/11/2017 04:54 PM   Modules accepted: Orders

## 2017-04-13 ENCOUNTER — Encounter: Payer: Self-pay | Admitting: Podiatry

## 2017-04-13 ENCOUNTER — Ambulatory Visit (INDEPENDENT_AMBULATORY_CARE_PROVIDER_SITE_OTHER): Payer: Medicare Other | Admitting: Podiatry

## 2017-04-13 DIAGNOSIS — L97529 Non-pressure chronic ulcer of other part of left foot with unspecified severity: Secondary | ICD-10-CM

## 2017-04-13 DIAGNOSIS — L97522 Non-pressure chronic ulcer of other part of left foot with fat layer exposed: Secondary | ICD-10-CM | POA: Diagnosis not present

## 2017-04-13 DIAGNOSIS — I83025 Varicose veins of left lower extremity with ulcer other part of foot: Secondary | ICD-10-CM

## 2017-04-13 DIAGNOSIS — I890 Lymphedema, not elsewhere classified: Secondary | ICD-10-CM

## 2017-04-13 NOTE — Progress Notes (Signed)
   HPI: 61 year old male presents the office today for evaluation of thickened, elongated, discolored nails 1-5 bilateral. Patient also was recently discharged from the hospital proximally 2 weeks ago for sepsis/cellulitis in the lower extremities. Patient is being managed by his PCP and is currently taking doxycycline and ciprofloxacin. Patient also has long-standing history of bilateral lower extremity lymphedema. Patient has an appointment to see Dr. Lemar LivingsBrandon Cain MD, VVS, for vascular workup regarding his bilateral lower extremity lymphedema.    Physical Exam: General: The patient is alert and oriented x3 in no acute distress.  Dermatology: Noted diffuse hyperkeratosis noted throughout the lower extremities of the legs and feet with fissuring and superficial breaks in the skin. There is some weeping also noted. This is all secondary to chronic history of bilateral lower extremity lymphedema.  Hyperkeratotic, thickened, dystrophic, discolored nails also noted 1-5 bilateral  There is also an ulcer noted to the distal tuft of the fourth digit left foot measuring approximately 0.4x0.4x0.2cm (LxWxD). To the noted ulceration, there is no eschar. There is no exposed bone muscle-tendon ligament or joint. The wound does not probe to bone. Wound base and granulation tissue is red. There is sanguinous drainage noted after debridement. Periwound integrity is intact and there is no sign of cellulitis or infectious process. No malodor.  Vascular: Bilateral lower extremity lymphedema. Chronic, heavy lymphedema noted. Nonpitting edema extending proximal to the level of the knee.  Neurological: Epicritic and protective threshold grossly diminished bilaterally.   Musculoskeletal Exam: Limited range of motion due to excessive lymphedema bilateral lower extremities. Negative for any Pain on palpation throughout the pedal and ankle joints  Assessment: 1. Ulcer fourth digit left foot secondary to vascular  insufficiency 2. Lymphedema bilateral lower extremities 3. Polyneuropathy bilateral lower extremities  4. 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 Patient was evaluated. 2. Medically necessary excisional debridement including subcutaneous tissue was performed to the ulceration using a tissue nipper. Excisional debridement of all the necrotic nonviable tissue down to healthy bleeding viable tissue was performed with post-debridement measurements and was pre- 3. Recommend daily application of antibiotic ointment and Band-Aid to the ulceration 4. Patient has appointment with vascular 05/05/2017 regarding lower extremity lymphedema 5. Continue antibiotics and management recommended by PCP for history of sepsis/cellulitis bilateral lower extremities 6. Return to clinic in 3 months   Felecia ShellingBrent M. Novalee Horsfall, DPM [facility]  Dr. Felecia ShellingBrent M. Mallissa Lorenzen, DPM    [address]                                        [place], [place] [id number]                Office [phone]  Fax [phone]

## 2017-04-27 ENCOUNTER — Encounter: Payer: Self-pay | Admitting: Surgery

## 2017-05-05 ENCOUNTER — Ambulatory Visit (HOSPITAL_COMMUNITY)
Admission: RE | Admit: 2017-05-05 | Discharge: 2017-05-05 | Disposition: A | Discharge status: Home / Self Care | Payer: Medicare Other | Source: Ambulatory Visit | Attending: Vascular Surgery | Admitting: Vascular Surgery

## 2017-05-05 DIAGNOSIS — I872 Venous insufficiency (chronic) (peripheral): Secondary | ICD-10-CM

## 2017-05-05 DIAGNOSIS — I890 Lymphedema, not elsewhere classified: Secondary | ICD-10-CM | POA: Diagnosis present

## 2017-05-05 DIAGNOSIS — R600 Localized edema: Secondary | ICD-10-CM

## 2017-05-06 ENCOUNTER — Encounter: Payer: Medicare Other | Admitting: Vascular Surgery

## 2017-05-11 ENCOUNTER — Encounter: Payer: Self-pay | Admitting: Surgery

## 2017-05-11 ENCOUNTER — Ambulatory Visit (INDEPENDENT_AMBULATORY_CARE_PROVIDER_SITE_OTHER): Payer: Medicare Other | Admitting: Surgery

## 2017-05-11 VITALS — BP 131/84 | HR 71 | Temp 97.8°F | Resp 18 | Ht 73.0 in | Wt 331.0 lb

## 2017-05-11 DIAGNOSIS — I83899 Varicose veins of unspecified lower extremities with other complications: Secondary | ICD-10-CM
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
## 2017-05-12 ENCOUNTER — Emergency Department (HOSPITAL_COMMUNITY)
Admission: EM | Admit: 2017-05-12 | Discharge: 2017-05-12 | Disposition: A | Discharge status: Home / Self Care | Payer: Medicare Other | Attending: Emergency Medicine | Admitting: Emergency Medicine

## 2017-05-12 ENCOUNTER — Emergency Department (HOSPITAL_COMMUNITY): Payer: Medicare Other

## 2017-05-12 ENCOUNTER — Encounter (HOSPITAL_COMMUNITY): Payer: Self-pay | Admitting: Nurse Practitioner

## 2017-05-12 DIAGNOSIS — W06XXXA Fall from bed, initial encounter: Secondary | ICD-10-CM | POA: Insufficient documentation

## 2017-05-12 DIAGNOSIS — I10 Essential (primary) hypertension: Secondary | ICD-10-CM | POA: Diagnosis not present

## 2017-05-12 DIAGNOSIS — W19XXXA Unspecified fall, initial encounter: Secondary | ICD-10-CM

## 2017-05-12 DIAGNOSIS — Y939 Activity, unspecified: Secondary | ICD-10-CM | POA: Insufficient documentation

## 2017-05-12 DIAGNOSIS — Z043 Encounter for examination and observation following other accident: Secondary | ICD-10-CM | POA: Insufficient documentation

## 2017-05-12 DIAGNOSIS — Z79899 Other long term (current) drug therapy: Secondary | ICD-10-CM | POA: Diagnosis not present

## 2017-05-12 DIAGNOSIS — Y999 Unspecified external cause status: Secondary | ICD-10-CM | POA: Insufficient documentation

## 2017-05-12 DIAGNOSIS — Y92003 Bedroom of unspecified non-institutional (private) residence as the place of occurrence of the external cause: Secondary | ICD-10-CM | POA: Diagnosis not present

## 2017-05-12 LAB — COMPREHENSIVE METABOLIC PANEL
ALK PHOS: 33 U/L — AB (ref 38–126)
ALT: 20 U/L (ref 17–63)
ANION GAP: 11 (ref 5–15)
AST: 29 U/L (ref 15–41)
Albumin: 3.4 g/dL — ABNORMAL LOW (ref 3.5–5.0)
BUN: 13 mg/dL (ref 6–20)
CALCIUM: 8.4 mg/dL — AB (ref 8.9–10.3)
CO2: 24 mmol/L (ref 22–32)
Chloride: 103 mmol/L (ref 101–111)
Creatinine, Ser: 1.02 mg/dL (ref 0.61–1.24)
GFR calc non Af Amer: >60 mL/min (ref >60)
Glucose, Bld: 105 mg/dL — ABNORMAL HIGH (ref 65–99)
POTASSIUM: 3.3 mmol/L — AB (ref 3.5–5.1)
SODIUM: 138 mmol/L (ref 135–145)
TOTAL PROTEIN: 7.3 g/dL (ref 6.5–8.1)
Total Bilirubin: 1.3 mg/dL — ABNORMAL HIGH (ref 0.3–1.2)

## 2017-05-12 LAB — URINALYSIS, ROUTINE W REFLEX MICROSCOPIC
Bilirubin Urine: NEGATIVE
GLUCOSE, UA: NEGATIVE mg/dL
Ketones, ur: 5 mg/dL — AB
LEUKOCYTES UA: NEGATIVE
NITRITE: NEGATIVE
PH: 5 (ref 5.0–8.0)
Protein, ur: 30 mg/dL — AB
RBC / HPF: NONE SEEN RBC/hpf (ref 0–5)
SPECIFIC GRAVITY, URINE: 1.018 (ref 1.005–1.030)

## 2017-05-12 LAB — CBC WITH DIFFERENTIAL/PLATELET
BASOS ABS: 0 10*3/uL (ref 0.0–0.1)
BASOS PCT: 0 %
Eosinophils Absolute: 0 10*3/uL (ref 0.0–0.7)
Eosinophils Relative: 0 %
HEMATOCRIT: 41.2 % (ref 39.0–52.0)
Hemoglobin: 14.3 g/dL (ref 13.0–17.0)
LYMPHS PCT: 2 %
Lymphs Abs: 0.4 10*3/uL — ABNORMAL LOW (ref 0.7–4.0)
MCH: 32.5 pg (ref 26.0–34.0)
MCHC: 34.7 g/dL (ref 30.0–36.0)
MCV: 93.6 fL (ref 78.0–100.0)
Monocytes Absolute: 0.4 10*3/uL (ref 0.1–1.0)
Monocytes Relative: 2 %
NEUTROS ABS: 14.4 10*3/uL — AB (ref 1.7–7.7)
Neutrophils Relative %: 96 %
Platelets: 200 10*3/uL (ref 150–400)
RBC: 4.4 MIL/uL (ref 4.22–5.81)
RDW: 13.5 % (ref 11.5–15.5)
WBC: 15.1 10*3/uL — AB (ref 4.0–10.5)

## 2017-05-12 LAB — I-STAT CHEM 8, ED
BUN: 14 mg/dL (ref 6–20)
Calcium, Ion: 1.04 mmol/L — ABNORMAL LOW (ref 1.15–1.40)
Chloride: 100 mmol/L — ABNORMAL LOW (ref 101–111)
Creatinine, Ser: 0.9 mg/dL (ref 0.61–1.24)
Glucose, Bld: 99 mg/dL (ref 65–99)
HEMATOCRIT: 45 % (ref 39.0–52.0)
HEMOGLOBIN: 15.3 g/dL (ref 13.0–17.0)
POTASSIUM: 3.2 mmol/L — AB (ref 3.5–5.1)
Sodium: 141 mmol/L (ref 135–145)
TCO2: 23 mmol/L (ref 0–100)

## 2017-05-12 LAB — CK: CK TOTAL: 281 U/L (ref 49–397)

## 2017-05-12 MED ORDER — MAGNESIUM OXIDE 400 (241.3 MG) MG PO TABS
800.0000 mg | ORAL_TABLET | Freq: Once | ORAL | Status: AC
  Administered 2017-05-12: 800 mg via ORAL
  Filled 2017-05-12: qty 2

## 2017-05-12 MED ORDER — POTASSIUM CHLORIDE CRYS ER 20 MEQ PO TBCR
40.0000 meq | EXTENDED_RELEASE_TABLET | Freq: Once | ORAL | Status: AC
  Administered 2017-05-12: 40 meq via ORAL
  Filled 2017-05-12: qty 2

## 2017-05-12 MED ORDER — SODIUM CHLORIDE 0.9 % IV BOLUS (SEPSIS)
500.0000 mL | Freq: Once | INTRAVENOUS | Status: AC
  Administered 2017-05-12: 500 mL via INTRAVENOUS

## 2017-05-12 MED ORDER — SODIUM CHLORIDE 0.9 % IV BOLUS (SEPSIS)
1000.0000 mL | Freq: Once | INTRAVENOUS | Status: AC
  Administered 2017-05-12: 1000 mL via INTRAVENOUS

## 2017-05-12 NOTE — ED Notes (Signed)
Patient transported to X-ray 

## 2017-05-12 NOTE — ED Notes (Signed)
Patient ambulated in room with tech. Reported he did well. Some shortness of breath which he stated is normal due to exertion. Oxygen maintained at 97-99% RA. Walker from home used. Did c/o of some dizziness when sitting up but tolerable. VSS

## 2017-05-12 NOTE — ED Notes (Signed)
Bed: [id number] Expected date:  Expected time:  Means of arrival:  Comments: EMS 61 y/o fall

## 2017-05-12 NOTE — ED Triage Notes (Signed)
Pt arrived via ems from home with c/o fall during the night. VS: 114/70, 104, 96% RA, CBG 106, 22 resp NSR. Pt reported he was in the bed and fell. Was able to crawl to the living room and get to the phone to call ems and his daughter. Pt reports being in the hospital a week ago with sepsis and left leg pain. Reports back pain which is chronic and is no big deal because he is used to it. Denies LOC, or pain on any other areas. Neuro intact. A&Ox4.

## 2017-05-12 NOTE — ED Provider Notes (Signed)
[facility]-EMERGENCY DEPT Provider Note   CSN: [id number] Arrival date & time: 05/12/17  7253     History   Chief Complaint Chief Complaint  Patient presents with  . Fall    HPI [name] is a 61 y.o. male.  61 yo M with a chief complaint of a fall. Patient was getting up to use the bathroom and felt that his legs get stuck in his sheets and fell out of the bed. The patient was unable to get up on his own and crawled into the living room and called for help. He is not sure how long it took. The patient was just in the hospital for sepsis. He feels that he has been doing mildly better since then. Denies fevers or chills. Denies cough or congestion. Denies vomiting or diarrhea. Has some low back pain post fall. This is chronic back pain though he is not feeling this changed. Denies head injury or loss consciousness. Denies shortness breath chest pain.   The history is provided by the patient.  Fall  This is a new problem. The current episode started less than 1 hour ago. The problem occurs constantly. The problem has not changed since onset.Pertinent negatives include no chest pain, no abdominal pain, no headaches and no shortness of breath. Nothing aggravates the symptoms. Nothing relieves the symptoms. He has tried nothing for the symptoms. The treatment provided no relief.    Past Medical History:  Diagnosis Date  . Cataracts, bilateral   . Chronic insomnia   . Chronic low back pain   . Gait disorder   . History of headache   . History of melanoma   . Hydrocephalus   . Hypertension   . Obesity   . Obstructive sleep apnea on CPAP   . Pancreatitis    Secondary to gallstones  . Pancreatitis due to biliary obstruction   . Peripheral edema    Chronic venous stasis  . Peripheral neuropathy     Patient Active Problem List   Diagnosis Date Noted  . Cellulitis of right lower extremity   . Sepsis due to cellulitis (HCC) 03/21/2017  . Essential hypertension 03/21/2017  . Chronic  diastolic CHF (congestive heart failure) (HCC) 03/21/2017  . Renal insufficiency 03/21/2017  . Meralgia paresthetica 10/12/2013  . Polyneuropathy in other diseases classified elsewhere (HCC) 11/22/2012  . Obstructive sleep apnea (adult) (pediatric) 11/22/2012  . Abnormality of gait 11/22/2012  . Headache(784.0) 11/22/2012  . Pain in limb 11/22/2012  . Lumbago 11/22/2012  . Cervicalgia 11/22/2012  . Spinal stenosis in cervical region 11/22/2012  . Degeneration of intervertebral disc, site unspecified 11/22/2012  . Degeneration of cervical intervertebral disc 11/22/2012  . Obstructive hydrocephalus 11/22/2012    Past Surgical History:  Procedure Laterality Date  . CARPAL TUNNEL RELEASE Bilateral   . CHOLECYSTECTOMY    . INGUINAL HERNIA REPAIR    . LUMBAR SPINE SURGERY    . TONSILLECTOMY         Home Medications    Prior to Admission medications   Medication Sig Start Date End Date Taking? Authorizing Provider  ammonium lactate (AMLACTIN) 12 % cream Apply 1 application topically at bedtime as needed for dry skin.  03/19/14  Yes [provider]  aspirin 81 MG tablet Take 81 mg by mouth daily.   Yes [provider]  atorvastatin (LIPITOR) 20 MG tablet Take 20 mg by mouth daily.  10/13/14  Yes [provider]  diclofenac (VOLTAREN) 75 MG EC tablet TAKE 1  TABLET BY MOUTH 2  TIMES DAILY WITH A MEAL 02/05/16  Yes York Spaniel, MD  furosemide (LASIX) 40 MG tablet Take 40 mg by mouth 2 (two) times daily.   Yes [provider]  gabapentin (NEURONTIN) 300 MG capsule Take 1 capsule (300 mg total) by mouth 3 (three) times daily. Patient taking differently: Take 600 mg by mouth 3 (three) times daily.  02/29/16  Yes York Spaniel, MD  hydrocerin (EUCERIN) CREA Apply 1 application topically daily. 03/30/17  Yes Regalado, Belkys A, MD  HYDROcodone-acetaminophen (NORCO) 7.5-325 MG tablet Take 1 tablet by mouth every 6 (six) hours as needed for moderate pain  (Must last 28 days). Patient taking differently: Take 1 tablet by mouth at bedtime.  04/11/17  Yes York Spaniel, MD  nortriptyline (PAMELOR) 10 MG capsule TAKE 3 CAPSULES (30 MG TOTAL) BY MOUTH EVERY EVENING. 04/11/15  Yes York Spaniel, MD  ramipril (ALTACE) 10 MG capsule Take 10 mg by mouth 2 (two) times daily.   Yes [provider]  tiZANidine (ZANAFLEX) 4 MG tablet Take 1 tablet by mouth 3  times daily Patient taking differently: Take 4 mg by mouth at bedtime.  04/21/16  Yes York Spaniel, MD  traZODone (DESYREL) 50 MG tablet Take 1 tablet by mouth at bedtime. 03/19/14  Yes [provider]  triamcinolone cream (KENALOG) 0.1 % Apply 1 application topically daily as needed (for rash).    Yes [provider]  doxycycline (VIBRA-TABS) 100 MG tablet Take 1 tablet (100 mg total) by mouth every 12 (twelve) hours. Patient not taking: Reported on 05/12/2017 03/29/17   Alba Cory, MD    Family History Family History  Problem Relation Age of Onset  . Cancer Mother        Liver cancer  . Cancer Father        brain cancer  . Cancer Maternal Grandmother        Liver cancer  . Heart attack Maternal Grandfather     Social History Social History  Substance Use Topics  . Smoking status: Never Smoker  . Smokeless tobacco: Never Used  . Alcohol use 0.0 oz/week     Comment: Consumes alcohol on occasion     Allergies   Patient has no known allergies.   Review of Systems Review of Systems  Constitutional: Negative for chills and fever.  HENT: Negative for congestion and facial swelling.   Eyes: Negative for discharge and visual disturbance.  Respiratory: Negative for shortness of breath.   Cardiovascular: Negative for chest pain and palpitations.  Gastrointestinal: Negative for abdominal pain, diarrhea and vomiting.  Musculoskeletal: Negative for arthralgias and myalgias.  Skin: Negative for color change and rash.  Neurological: Negative for  tremors, syncope and headaches.  Psychiatric/Behavioral: Negative for confusion and dysphoric mood.     Physical Exam Updated Vital Signs BP (!) 137/55 (BP Location: Right Arm)   Pulse (!) 108   Temp 98.1 F (36.7 C) (Oral)   Resp (!) 22   Ht 6\' 1"  (1.854 m)   Wt (!) 152 kg (335 lb)   SpO2 94%   BMI 44.20 kg/m   Physical Exam  Constitutional: He is oriented to person, place, and time. He appears well-developed and well-nourished.  HENT:  Head: Normocephalic and atraumatic.  Eyes: Pupils are equal, round, and reactive to light. EOM are normal.  Neck: Normal range of motion. Neck supple. No JVD present.  Cardiovascular: Normal rate and regular rhythm.  Exam  reveals no gallop and no friction rub.   No murmur heard. Pulmonary/Chest: No respiratory distress. He has no wheezes.  Abdominal: He exhibits no distension. There is no rebound and no guarding.  Musculoskeletal: Normal range of motion.  Neurological: He is alert and oriented to person, place, and time.  Skin: No rash noted. No pallor.  Psychiatric: He has a normal mood and affect. His behavior is normal.  Nursing note and vitals reviewed.    ED Treatments / Results  Labs (all labs ordered are listed, but only abnormal results are displayed) Labs Reviewed  CBC WITH DIFFERENTIAL/PLATELET - Abnormal; Notable for the following:       Result Value   WBC 15.1 (*)    Neutro Abs 14.4 (*)    Lymphs Abs 0.4 (*)    All other components within normal limits  COMPREHENSIVE METABOLIC PANEL - Abnormal; Notable for the following:    Potassium 3.3 (*)    Glucose, Bld 105 (*)    Calcium 8.4 (*)    Albumin 3.4 (*)    Alkaline Phosphatase 33 (*)    Total Bilirubin 1.3 (*)    All other components within normal limits  URINALYSIS, ROUTINE W REFLEX MICROSCOPIC - Abnormal; Notable for the following:    Color, Urine AMBER (*)    Hgb urine dipstick SMALL (*)    Ketones, ur 5 (*)    Protein, ur 30 (*)    Bacteria, UA RARE (*)     Squamous Epithelial / LPF 0-5 (*)    All other components within normal limits  I-STAT CHEM 8, ED - Abnormal; Notable for the following:    Potassium 3.2 (*)    Chloride 100 (*)    Calcium, Ion 1.04 (*)    All other components within normal limits  CK    EKG  EKG Interpretation None       Radiology Dg Chest 2 View  Result Date: 05/12/2017 CLINICAL DATA:  Increasing shortness of breath, hypoxia, recent fall EXAM: CHEST  2 VIEW COMPARISON:  Chest x-ray of 03/24/2017 FINDINGS: No active infiltrate or effusion is seen. Mediastinal and hilar contours are unremarkable. Somewhat prominent perihilar markings may indicate bronchitis. The heart is mildly enlarged and stable. No acute bony abnormality is seen. IMPRESSION: 1. No pneumonia or effusion. 2. Question bronchitis. 3. Stable borderline cardiomegaly. Electronically Signed   By: Dwyane DeePaul  Barry M.D.   On: 05/12/2017 09:25   Dg Lumbar Spine Complete  Result Date: 05/12/2017 CLINICAL DATA:  Hypoxia, recent fall, moderate to severe low back pain EXAM: LUMBAR SPINE - COMPLETE 4+ VIEW COMPARISON:  MR lumbar spine of 03/25/2012 FINDINGS: There may be 6 non rib-bearing lumbar vertebra but thoracic spine films would be helpful to assess further. The lumbar vertebrae are normal alignment. There is diffuse degenerative disc disease present with some fusion at what I have marked as the L4-5 level. No compression deformity is seen. There is degenerative change involving the facet joints particularly of L4-5 and L5-S1. The SI joints are corticated. IMPRESSION: 1. Normal alignment of the lumbar vertebrae with diffuse degenerative disc disease. 2. No compression deformity. 3. Possible 6 non rib-bearing lumbar vertebrae. Consider thoracic spine films for more accurate numbering of the thoracic and lumbar vertebrae. Electronically Signed   By: Dwyane DeePaul  Barry M.D.   On: 05/12/2017 09:27    Procedures Procedures (including critical care time)  Medications Ordered in  ED Medications  sodium chloride 0.9 % bolus 1,000 mL (0 mLs Intravenous Stopped 05/12/17  1000)  potassium chloride SA (K-DUR,KLOR-CON) CR tablet 40 mEq (40 mEq Oral Given 05/12/17 1014)  magnesium oxide (MAG-OX) tablet 800 mg (800 mg Oral Given 05/12/17 1014)  sodium chloride 0.9 % bolus 500 mL (500 mLs Intravenous New Bag/Given 05/12/17 1043)     Initial Impression / Assessment and Plan / ED Course  I have reviewed the triage vital signs and the nursing notes.  Pertinent labs & imaging results that were available during my care of the patient were reviewed by me and considered in my medical decision making (see chart for details).     61 yo M With a chief complaint of fall. Patient fell out of his bed. Denies head injury or loss consciousness. On initial vitals patient is noted to be hypotensive with a blood pressure in the 90s, map was 60. Given a liter of fluids. He currently is denying any systemic symptoms. Nursing with initial O2 sat in the 80s and started on oxygen. I was able to take him off oxygen in the room with a good waveform his O2 sat was 98. We'll continue to observe off oxygen. Chest x-ray labs. CK.    Lab eval unremarkable.  Able to ambulate without O2.  Feeling a bit tired still, give 500cc more fluids.  Home with PCP eval within a couple days.   10:52 AM:  I have discussed the diagnosis/risks/treatment options with the patient and family and believe the pt to be eligible for discharge home to follow-up with PCP. We also discussed returning to the ED immediately if new or worsening sx occur. We discussed the sx which are most concerning (e.g., sudden worsening pain, fever, inability to tolerate by mouth ) that necessitate immediate return. Medications administered to the patient during their visit and any new prescriptions provided to the patient are listed below.  Medications given during this visit Medications  sodium chloride 0.9 % bolus 1,000 mL (0 mLs Intravenous Stopped  05/12/17 1000)  potassium chloride SA (K-DUR,KLOR-CON) CR tablet 40 mEq (40 mEq Oral Given 05/12/17 1014)  magnesium oxide (MAG-OX) tablet 800 mg (800 mg Oral Given 05/12/17 1014)  sodium chloride 0.9 % bolus 500 mL (500 mLs Intravenous New Bag/Given 05/12/17 1043)     The patient appears reasonably screen and/or stabilized for discharge and I doubt any other medical condition or other [facility] requiring further screening, evaluation, or treatment in the ED at this time prior to discharge.    Final Clinical Impressions(s) / ED Diagnoses   Final diagnoses:  Fall, initial encounter    New Prescriptions New Prescriptions   No medications on file     Melene PlanFloyd, Aishwarya Shiplett, DO 05/12/17 1052

## 2017-05-20 ENCOUNTER — Telehealth: Payer: Self-pay | Admitting: Neurology

## 2017-05-20 ENCOUNTER — Encounter: Payer: Self-pay | Admitting: Vascular Surgery

## 2017-05-20 MED ORDER — HYDROCODONE-ACETAMINOPHEN 7.5-325 MG PO TABS: 1.0000 | ORAL_TABLET | Freq: Four times a day (QID) | ORAL | 0 refills | Status: DC | PRN

## 2017-05-20 NOTE — Addendum Note (Signed)
Addended by: York SpanielWILLIS, Khylon Davies K on: 05/20/2017 10:19 AM   Modules accepted: Orders

## 2017-05-20 NOTE — Telephone Encounter (Signed)
Hydrocodone will be refilled. 

## 2017-05-20 NOTE — Telephone Encounter (Signed)
Patient called office requesting refill for HYDROcodone-acetaminophen (NORCO) 7.5-325 MG tablet. °

## 2017-05-20 NOTE — Telephone Encounter (Signed)
.  Placed printed/signed rx hydrocodone up front for patient pick up.  

## 2017-05-23 ENCOUNTER — Encounter: Payer: Self-pay | Admitting: Vascular Surgery

## 2017-05-23 ENCOUNTER — Ambulatory Visit (INDEPENDENT_AMBULATORY_CARE_PROVIDER_SITE_OTHER): Payer: Medicare Other | Admitting: Vascular Surgery

## 2017-05-23 VITALS — BP 117/72 | HR 84 | Temp 97.5°F | Resp 18 | Ht 73.0 in | Wt 335.0 lb

## 2017-05-23 DIAGNOSIS — I83893 Varicose veins of bilateral lower extremities with other complications: Secondary | ICD-10-CM
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
## 2017-05-24 ENCOUNTER — Other Ambulatory Visit: Payer: Self-pay | Admitting: *Deleted

## 2017-05-24 DIAGNOSIS — I83893 Varicose veins of bilateral lower extremities with other complications: Secondary | ICD-10-CM

## 2017-05-30 ENCOUNTER — Encounter: Payer: Self-pay | Admitting: Vascular Surgery

## 2017-06-01 ENCOUNTER — Encounter: Payer: Self-pay | Admitting: Vascular Surgery

## 2017-06-01 ENCOUNTER — Ambulatory Visit (INDEPENDENT_AMBULATORY_CARE_PROVIDER_SITE_OTHER): Payer: Medicare Other | Admitting: Vascular Surgery

## 2017-06-01 VITALS — BP 135/70 | HR 93 | Temp 98.5°F | Resp 18 | Ht 73.0 in | Wt 330.0 lb

## 2017-06-01 DIAGNOSIS — I83893 Varicose veins of bilateral lower extremities with other complications: Secondary | ICD-10-CM

## 2017-06-01 HISTORY — PX: ENDOVENOUS ABLATION SAPHENOUS VEIN W/ LASER: SUR449

## 2017-06-01 NOTE — Progress Notes (Signed)
Subjective:     Patient ID: Caroll Rancherdmund L Dearman, male   DOB: 03/20/1956, 61 y.o.   MRN: [id number]  HPI This 61 year old male had laser ablation of the left great saphenous vein from the midcalf to near the saphenofemoral junction performed under local tumescent anesthesia. A total of 2723 J of energy was utilized. He tolerated the procedure well.  Review of Systems     Objective:   Physical Exam BP 135/70 (BP Location: Left Arm, Patient Position: Sitting, Cuff Size: Large)   Pulse 93   Temp 98.5 F (36.9 C)   Resp 18   Ht 6\' 1"  (1.854 m)   Wt (!) 330 lb (149.7 kg)   SpO2 99%   BMI 43.54 kg/m        Assessment:     Well-tolerated laser ablation left great saphenous vein from mid calf to near the saphenofemoral junction performed for severe chronic edema and skin changes with ulceration. Patient also has deep venous reflux    Plan:     Return in 1 week for venous duplex exam to confirm closure left great saphenous vein Patient will have similar procedure and contralateral right leg in the near future

## 2017-06-01 NOTE — Progress Notes (Signed)
Laser Ablation Procedure    Date: 06/01/2017   [name] DOB:08/01/1956  Consent signed: Yes    Surgeon:  Dr. Quita SkyeJames D. Hart RochesterLawson  Procedure: Laser Ablation: left Greater Saphenous Vein  BP 135/70 (BP Location: Left Arm, Patient Position: Sitting, Cuff Size: Large)   Pulse 93   Temp 98.5 F (36.9 C)   Resp 18   Ht 6\' 1"  (1.854 m)   Wt (!) 330 lb (149.7 kg)   SpO2 99%   BMI 43.54 kg/m   Tumescent Anesthesia: 450 cc 0.9% NaCl with 50 cc Lidocaine HCL with 1% Epi and 15 cc 8.4% NaHCO3  Local Anesthesia: 4 cc Lidocaine HCL and NaHCO3 (ratio 2:1)  Pulsed Mode: 15 watts, 500ms delay, 1.0 duration  Total Energy:  2723 Joules            Total Pulses: 182               Total Time: 3:01      Patient tolerated procedure well    Description of Procedure:  After marking the course of the secondary varicosities, the patient was placed on the operating table in the supine position, and the left leg was prepped and draped in sterile fashion.   Local anesthetic was administered and under ultrasound guidance the saphenous vein was accessed with a micro needle and guide wire; then the mirco puncture sheath was placed.  A guide wire was inserted saphenofemoral junction , followed by a 5 french sheath.  The position of the sheath and then the laser fiber below the junction was confirmed using the ultrasound.  Tumescent anesthesia was administered along the course of the saphenous vein using ultrasound guidance. The patient was placed in Trendelenburg position and protective laser glasses were placed on patient and staff, and the laser was fired at 15 watts continuous mode advancing 1-382mm/second for a total of 2723 joules.    Steri strip was applied to the IV insertion site and Kerlix, ABD pads and Ace wraps were applied to left foot, calf,and thigh as compressive dressing.   Ace wrap bandage applied at the top of the saphenofemoral junction. Blood loss was less than 15 cc.  The patient ambulated out  of the operating room using his walker having tolerated the procedure well.

## 2017-06-02 ENCOUNTER — Encounter: Payer: Self-pay | Admitting: Vascular Surgery

## 2017-06-07 ENCOUNTER — Ambulatory Visit (HOSPITAL_COMMUNITY)
Admission: RE | Admit: 2017-06-07 | Discharge: 2017-06-07 | Disposition: A | Discharge status: Home / Self Care | Payer: Medicare Other | Source: Ambulatory Visit | Attending: Vascular Surgery | Admitting: Vascular Surgery

## 2017-06-07 ENCOUNTER — Ambulatory Visit (INDEPENDENT_AMBULATORY_CARE_PROVIDER_SITE_OTHER): Payer: Medicare Other | Admitting: Vascular Surgery

## 2017-06-07 ENCOUNTER — Encounter: Payer: Self-pay | Admitting: Vascular Surgery

## 2017-06-07 VITALS — BP 121/75 | HR 74 | Temp 97.5°F | Resp 18 | Ht 73.0 in | Wt 330.0 lb

## 2017-06-07 DIAGNOSIS — I83893 Varicose veins of bilateral lower extremities with other complications: Secondary | ICD-10-CM

## 2017-06-07 NOTE — Progress Notes (Signed)
Subjective:     Patient ID: [name], male   DOB: [birth year], 61 y.o.   MRN: [id number]  HPI This 61 year old male returns 1 week post-laser ablation left great saphenous vein for severe chronic edema with hypertrophic skin changes and history of recurrent stasis ulceration. He states that the left leg feels less tight and has better color. He is able to maneuver his left lower leg or getting out of the bed more easily than before. He has had moderate discomfort which is resolving. He has been using elastic compression dressings below the knee. He had used along leg compression stocking initially.  Past Medical History:  Diagnosis Date  . Cataracts, bilateral   . Chronic insomnia   . Chronic low back pain   . Gait disorder   . History of headache   . History of melanoma   . Hydrocephalus   . Hypertension   . Obesity   . Obstructive sleep apnea on CPAP   . Pancreatitis    Secondary to gallstones  . Pancreatitis due to biliary obstruction   . Peripheral edema    Chronic venous stasis  . Peripheral neuropathy     Social History  Substance Use Topics  . Smoking status: Never Smoker  . Smokeless tobacco: Never Used  . Alcohol use 0.0 oz/week     Comment: Consumes alcohol on occasion    Family History  Problem Relation Age of Onset  . Cancer Mother        Liver cancer  . Cancer Father        brain cancer  . Cancer Maternal Grandmother        Liver cancer  . Heart attack Maternal Grandfather     No Known Allergies   Current Outpatient Prescriptions:  .  ammonium lactate (AMLACTIN) 12 % cream, Apply 1 application topically at bedtime as needed for dry skin. , Disp: , Rfl:  .  aspirin 81 MG tablet, Take 81 mg by mouth daily., Disp: , Rfl:  .  atorvastatin (LIPITOR) 20 MG tablet, Take 20 mg by mouth daily. , Disp: , Rfl:  .  diclofenac (VOLTAREN) 75 MG EC tablet, TAKE 1 TABLET BY MOUTH 2  TIMES DAILY WITH A MEAL, Disp: 180 tablet, Rfl: 1 .  doxycycline (VIBRA-TABS) 100 MG  tablet, Take 1 tablet (100 mg total) by mouth every 12 (twelve) hours. (Patient not taking: Reported on 05/12/2017), Disp: 14 tablet, Rfl: 0 .  furosemide (LASIX) 40 MG tablet, Take 40 mg by mouth 2 (two) times daily., Disp: , Rfl:  .  gabapentin (NEURONTIN) 300 MG capsule, Take 1 capsule (300 mg total) by mouth 3 (three) times daily. (Patient taking differently: Take 600 mg by mouth 3 (three) times daily. ), Disp: 270 capsule, Rfl: 3 .  hydrocerin (EUCERIN) CREA, Apply 1 application topically daily., Disp: 113 g, Rfl: 0 .  HYDROcodone-acetaminophen (NORCO) 7.5-325 MG tablet, Take 1 tablet by mouth every 6 (six) hours as needed for moderate pain (Must last 28 days)., Disp: 40 tablet, Rfl: 0 .  nortriptyline (PAMELOR) 10 MG capsule, TAKE 3 CAPSULES (30 MG TOTAL) BY MOUTH EVERY EVENING., Disp: 270 capsule, Rfl: 1 .  ramipril (ALTACE) 10 MG capsule, Take 10 mg by mouth 2 (two) times daily., Disp: , Rfl:  .  tiZANidine (ZANAFLEX) 4 MG tablet, Take 1 tablet by mouth 3  times daily (Patient taking differently: Take 4 mg by mouth at bedtime. ), Disp: 270 tablet, Rfl: 3 .  traZODone (DESYREL)  50 MG tablet, Take 1 tablet by mouth at bedtime., Disp: , Rfl:  .  triamcinolone cream (KENALOG) 0.1 %, Apply 1 application topically daily as needed (for rash). , Disp: , Rfl:   There were no vitals filed for this visit.  There is no height or weight on file to calculate BMI.         Review of Systems Denies chest pain but does have dyspnea on exertion. No PND orthopnea or hemoptysis.    Objective:   Physical Exam There were no vitals taken for this visit.  General morbidly obese male no apparent distress alert and oriented 3 Lungs no rhonchi or wheezing Left leg with dressing in place up to lower leg area. Leg seems less tight but does have chronic severe hypertrophic changes in the skin but no active ulceration. Mild tenderness to deep palpation over great saphenous vein.  Today I ordered a venous  duplex exam the left leg which I reviewed and interpreted. There is no DVT. There is total closure left great saphenous vein up to near the saphenofemoral junction    Assessment:     Successful laser ablation left great saphenous vein for gross reflux in patient with combination of lymphedema and severe venous insufficiency and history of recurrent venous ulcers    Plan:     Patient have similar procedure and contralateral right leg in the near future

## 2017-06-08 ENCOUNTER — Other Ambulatory Visit: Payer: Self-pay | Admitting: *Deleted

## 2017-06-08 DIAGNOSIS — I83893 Varicose veins of bilateral lower extremities with other complications: Secondary | ICD-10-CM

## 2017-06-15 ENCOUNTER — Encounter: Payer: Self-pay | Admitting: Vascular Surgery

## 2017-06-22 ENCOUNTER — Other Ambulatory Visit: Payer: Self-pay | Admitting: *Deleted

## 2017-06-22 DIAGNOSIS — F411 Generalized anxiety disorder: Secondary | ICD-10-CM

## 2017-06-22 MED ORDER — LORAZEPAM 1 MG PO TABS: ORAL_TABLET | ORAL | 0 refills | Status: DC

## 2017-06-24 ENCOUNTER — Encounter: Payer: Self-pay | Admitting: Vascular Surgery

## 2017-06-27 ENCOUNTER — Encounter: Payer: Self-pay | Admitting: Vascular Surgery

## 2017-06-27 ENCOUNTER — Ambulatory Visit (INDEPENDENT_AMBULATORY_CARE_PROVIDER_SITE_OTHER): Payer: Medicare Other | Admitting: Vascular Surgery

## 2017-06-27 VITALS — BP 115/72 | HR 74 | Temp 97.1°F | Resp 18 | Ht 73.0 in | Wt 330.0 lb

## 2017-06-27 DIAGNOSIS — I83891 Varicose veins of right lower extremities with other complications: Secondary | ICD-10-CM

## 2017-06-27 HISTORY — PX: ENDOVENOUS ABLATION SAPHENOUS VEIN W/ LASER: SUR449

## 2017-06-27 NOTE — Progress Notes (Signed)
Laser Ablation Procedure    Date: 06/27/2017   [name] DOB:04/18/1956  Consent signed: Yes    Surgeon:  Dr. Quita SkyeJames D. Hart RochesterLawson  Procedure: Laser Ablation: right Greater Saphenous Vein  BP 115/72 (BP Location: Left Arm, Patient Position: Sitting, Cuff Size: Large)   Pulse 74   Temp (!) 97.1 F (36.2 C) (Oral)   Resp 18   Ht 6\' 1"  (1.854 m)   Wt (!) 330 lb (149.7 kg)   SpO2 97%   BMI 43.54 kg/m   Tumescent Anesthesia: 425 cc 0.9% NaCl with 50 cc Lidocaine HCL with 1% Epi and 15 cc 8.4% NaHCO3  Local Anesthesia: 4 cc Lidocaine HCL and NaHCO3 (ratio 2:1)  Pulsed Mode: 15 watts, 500ms delay, 1.0 duration  Total Energy:  2263 Joules            Total Pulses:  152              Total Time:  2:31      Patient tolerated procedure well  Notes:  Ativan 1 mg (2 tablets) were taken by [name] 1 hour prior to procedure.    Description of Procedure:  After marking the course of the secondary varicosities, the patient was placed on the operating table in the supine position, and the right leg was prepped and draped in sterile fashion.   Local anesthetic was administered and under ultrasound guidance the saphenous vein was accessed with a micro needle and guide wire; then the mirco puncture sheath was placed.  A guide wire was inserted saphenofemoral junction , followed by a 5 french sheath.  The position of the sheath and then the laser fiber below the junction was confirmed using the ultrasound.  Tumescent anesthesia was administered along the course of the saphenous vein using ultrasound guidance. The patient was placed in Trendelenburg position and protective laser glasses were placed on patient and staff, and the laser was fired at 15 watts continuous mode advancing 1-692mm/second for a total of 2263 joules.       Steri strip was applied to the IV insertion site and ABD pads and Ace wrap bandages were applied from right foot to right groin. Marland Kitchen.  Ace wrap bandages were also applied at the top  of the saphenofemoral junction. Blood loss was less than 15 cc.  The patient exited out of the operating room via wheelchair to car having tolerated the procedure well.

## 2017-06-27 NOTE — Progress Notes (Signed)
Subjective:     Patient ID: [name], male   DOB: 10/05/1956, 61 y.o.   MRN: [id number]  HPI This 61 year old male had laser ablation of the right great saphenous vein from the proximal calf to near the saphenofemoral junction performed under local tumescent anesthesia. A total of 2263 J of energy was utilized. He tolerated the procedure well.  Review of Systems     Objective:   Physical Exam BP 115/72 (BP Location: Left Arm, Patient Position: Sitting, Cuff Size: Large)   Pulse 74   Temp (!) 97.1 F (36.2 C) (Oral)   Resp 18   Ht 6\' 1"  (1.854 m)   Wt (!) 330 lb (149.7 kg)   SpO2 97%   BMI 43.54 kg/m        Assessment:     Well tolerated laser ablation right great saphenous vein performed under local tumescent anesthesia    Plan:     Return in 1 week for venous duplex exam to confirm closure right great saphenous vein and this will complete patient's treatment regimen

## 2017-06-28 ENCOUNTER — Encounter: Payer: Self-pay | Admitting: Vascular Surgery

## 2017-06-29 ENCOUNTER — Telehealth: Payer: Self-pay | Admitting: Adult Health

## 2017-06-29 NOTE — Telephone Encounter (Signed)
Patient requesting refill of HYDROcodone-acetaminophen (NORCO) 7.5-325 MG tablet. ° ° °

## 2017-06-30 MED ORDER — HYDROCODONE-ACETAMINOPHEN 7.5-325 MG PO TABS: 1.0000 | ORAL_TABLET | Freq: Four times a day (QID) | ORAL | 0 refills | Status: DC | PRN

## 2017-06-30 NOTE — Telephone Encounter (Signed)
A prescription for hydrocodone will be written.

## 2017-06-30 NOTE — Telephone Encounter (Signed)
Placed printed/signed rx up front for patient pick up.  

## 2017-06-30 NOTE — Addendum Note (Signed)
Addended by: York SpanielWILLIS, CHARLES K on: 06/30/2017 07:53 AM   Modules accepted: Orders

## 2017-07-05 ENCOUNTER — Ambulatory Visit (INDEPENDENT_AMBULATORY_CARE_PROVIDER_SITE_OTHER): Payer: Medicare Other | Admitting: Vascular Surgery

## 2017-07-05 ENCOUNTER — Ambulatory Visit (HOSPITAL_COMMUNITY)
Admission: RE | Admit: 2017-07-05 | Discharge: 2017-07-05 | Disposition: A | Discharge status: Home / Self Care | Payer: Medicare Other | Source: Ambulatory Visit | Attending: Vascular Surgery | Admitting: Vascular Surgery

## 2017-07-05 VITALS — BP 105/62 | HR 78 | Temp 97.6°F | Resp 18 | Ht 73.0 in | Wt 290.0 lb

## 2017-07-05 DIAGNOSIS — I83893 Varicose veins of bilateral lower extremities with other complications: Secondary | ICD-10-CM | POA: Insufficient documentation

## 2017-07-05 NOTE — Progress Notes (Signed)
Subjective:     Patient ID: [name], male   DOB: 1956-04-02, 61 y.o.   MRN: [id number]  HPI This 61 year old male returns 1 week post-laser ablation right great saphenous vein from proximal calf to near the saphenofemoral junction for chronic severe edema with severe hypertrophic skin changes bilaterally and history of left leg stasis ulcer. He states that since his procedures on both legs that he has much better feeling in his feet and is able to get in and out of the bed much easier. The left leg ulcer has healed. He is not having significant pain in the thigh with a laser ablation was performed most recently on the right side.  Past Medical History:  Diagnosis Date  . Cataracts, bilateral   . Chronic insomnia   . Chronic low back pain   . Gait disorder   . History of headache   . History of melanoma   . Hydrocephalus   . Hypertension   . Obesity   . Obstructive sleep apnea on CPAP   . Pancreatitis    Secondary to gallstones  . Pancreatitis due to biliary obstruction   . Peripheral edema    Chronic venous stasis  . Peripheral neuropathy     Social History  Substance Use Topics  . Smoking status: Never Smoker  . Smokeless tobacco: Never Used  . Alcohol use 0.0 oz/week     Comment: Consumes alcohol on occasion    Family History  Problem Relation Age of Onset  . Cancer Mother        Liver cancer  . Cancer Father        brain cancer  . Cancer Maternal Grandmother        Liver cancer  . Heart attack Maternal Grandfather     No Known Allergies   Current Outpatient Prescriptions:  .  ammonium lactate (AMLACTIN) 12 % cream, Apply 1 application topically at bedtime as needed for dry skin. , Disp: , Rfl:  .  aspirin 81 MG tablet, Take 81 mg by mouth daily., Disp: , Rfl:  .  atorvastatin (LIPITOR) 20 MG tablet, Take 20 mg by mouth daily. , Disp: , Rfl:  .  diclofenac (VOLTAREN) 75 MG EC tablet, TAKE 1 TABLET BY MOUTH 2  TIMES DAILY WITH A MEAL, Disp: 180 tablet, Rfl:  1 .  doxycycline (VIBRA-TABS) 100 MG tablet, Take 1 tablet (100 mg total) by mouth every 12 (twelve) hours. (Patient not taking: Reported on 05/12/2017), Disp: 14 tablet, Rfl: 0 .  furosemide (LASIX) 40 MG tablet, Take 40 mg by mouth 2 (two) times daily., Disp: , Rfl:  .  gabapentin (NEURONTIN) 300 MG capsule, Take 1 capsule (300 mg total) by mouth 3 (three) times daily. (Patient taking differently: Take 600 mg by mouth 3 (three) times daily. ), Disp: 270 capsule, Rfl: 3 .  hydrocerin (EUCERIN) CREA, Apply 1 application topically daily., Disp: 113 g, Rfl: 0 .  HYDROcodone-acetaminophen (NORCO) 7.5-325 MG tablet, Take 1 tablet by mouth every 6 (six) hours as needed for moderate pain (Must last 28 days)., Disp: 40 tablet, Rfl: 0 .  LORazepam (ATIVAN) 1 MG tablet, Take 2 tablets 60 minutes prior to leaving house for office surgery., Disp: 2 tablet, Rfl: 0 .  nortriptyline (PAMELOR) 10 MG capsule, TAKE 3 CAPSULES (30 MG TOTAL) BY MOUTH EVERY EVENING., Disp: 270 capsule, Rfl: 1 .  ramipril (ALTACE) 10 MG capsule, Take 10 mg by mouth 2 (two) times daily., Disp: , Rfl:  .  tiZANidine (ZANAFLEX) 4 MG tablet, Take 1 tablet by mouth 3  times daily (Patient taking differently: Take 4 mg by mouth at bedtime. ), Disp: 270 tablet, Rfl: 3 .  traZODone (DESYREL) 50 MG tablet, Take 1 tablet by mouth at bedtime., Disp: , Rfl:  .  triamcinolone cream (KENALOG) 0.1 %, Apply 1 application topically daily as needed (for rash). , Disp: , Rfl:   Vitals:   07/05/17 1339  BP: 105/62  Pulse: 78  Resp: 18  Temp: 97.6 F (36.4 C)  SpO2: 96%  Weight: 290 lb (131.5 kg)  Height:  (1.854 m)    Body mass index is 38.26 kg/m.         Review of Systems Denies chest pain, dyspnea on exertion, PND, orthopnea, hemoptysis, claudication    Objective:   Physical Exam BP 105/62   Pulse 78   Temp 97.6 F (36.4 C)   Resp 18   Ht  (1.854 m)   Wt 290 lb (131.5 kg)   SpO2 96%   BMI 38.26 kg/m   Gen. obese  male who is alert and oriented 3 in no apparent distress Lungs no rhonchi or wheezing Cardiovascular regular rhythm no murmurs Right leg with mild discomfort to deep palpation in mid thigh with nicely healing entrance site and proximal calf medially. 1-2+ chronic edema bilaterally with severe hyperpigmentation and no active ulceration.  Today I ordered a venous duplex exam the right leg which I reviewed and interpreted. There is no DVT. There is total closure of the right great saphenous vein from the midcalf to near the saphenofemoral junction     Assessment:     Successful bilateral laser ablation great saphenous veins for patient with combination of chronic lymphedema and venous insufficiency with superficial and deep reflux and history of left leg stasis ulceration Patient symptomatically has improved since both procedures    Plan:     Return to see me on a when necessary basis

## 2017-08-05 ENCOUNTER — Other Ambulatory Visit: Payer: Self-pay | Admitting: Neurology

## 2017-08-05 MED ORDER — HYDROCODONE-ACETAMINOPHEN 7.5-325 MG PO TABS: 1.0000 | ORAL_TABLET | Freq: Four times a day (QID) | ORAL | 0 refills | Status: DC | PRN

## 2017-08-05 NOTE — Telephone Encounter (Signed)
Placed printed/signed rx up front for patient pick up. Tried calling patient but phone continued to ring, unable to LVM. If he calls, please let him know rx ready for pick up.

## 2017-08-05 NOTE — Telephone Encounter (Signed)
Patient is aware that the Rx is at the front desk and he will pick it up on Monday.

## 2017-08-05 NOTE — Telephone Encounter (Signed)
The [place] registry was checked, the hydrocodone will be refilled. 

## 2017-08-05 NOTE — Telephone Encounter (Signed)
Pt states he requested this refill a few days ago but there is no record of it.  Pt is requesting a refill of HYDROcodone-acetaminophen (NORCO) 7.5-325 MG tablet.  If possible please call if this is something he could pick up before 12 noon today

## 2017-08-05 NOTE — Addendum Note (Signed)
Addended by: York SpanielWILLIS, Madysen Faircloth K on: 08/05/2017 09:34 AM   Modules accepted: Orders

## 2017-09-21 ENCOUNTER — Telehealth: Payer: Self-pay

## 2017-09-21 MED ORDER — HYDROCODONE-ACETAMINOPHEN 7.5-325 MG PO TABS: 1.0000 | ORAL_TABLET | Freq: Four times a day (QID) | ORAL | 0 refills | Status: DC | PRN

## 2017-09-21 NOTE — Telephone Encounter (Signed)
Patient is calling for a refill on his hydrocodone.

## 2017-09-21 NOTE — Addendum Note (Signed)
Addended by: York SpanielWILLIS, CHARLES K on: 09/21/2017 05:01 PM   Modules accepted: Orders

## 2017-09-21 NOTE — Telephone Encounter (Signed)
The hydro[place]one will be refilled, [place] [facility] [place] [place].  The patient will need a revisit in the next several weeks.

## 2017-09-22 NOTE — Telephone Encounter (Signed)
Called pt. Scheduled appt with Cw,MD 10/14/17 at 12pm, check in 1130am. He verbalized understanding. Made him aware rx hydrocodone ready for pick up. He will come by today to pick up.

## 2017-10-14 ENCOUNTER — Ambulatory Visit: Payer: Medicare Other | Admitting: Neurology

## 2017-10-14 ENCOUNTER — Encounter: Payer: Self-pay | Admitting: Neurology

## 2017-10-14 VITALS — BP 122/73 | HR 72 | Ht 73.0 in | Wt 334.0 lb

## 2017-10-14 DIAGNOSIS — G8929 Other chronic pain: Secondary | ICD-10-CM | POA: Diagnosis not present

## 2017-10-14 DIAGNOSIS — M545 Low back pain, unspecified: Secondary | ICD-10-CM

## 2017-10-14 MED ORDER — HYDROCODONE-ACETAMINOPHEN 7.5-325 MG PO TABS: 1.0000 | ORAL_TABLET | Freq: Four times a day (QID) | ORAL | 0 refills | Status: DC | PRN

## 2017-10-14 NOTE — Progress Notes (Signed)
Reason for visit: Chronic low back pain  [name] is an 61 y.o. male  History of present illness:  [name] is a 61 year old right-handed white male with a history of chronic low back pain.  He has in the past gained about 3 or 4 months of benefit following epidural steroid injections of the back, his last injection was in July 2017.  The patient has hydrocodone to take for the back, he usually takes 1 tablet at night.  This may help him sleep.  He takes diclofenac on occasion, he has gabapentin, tizanidine, and nortriptyline to take for discomfort as well.  The patient reports no falls since last seen, he uses a cane for ambulation.  The patient is now living alone, his wife passed away earlier this year.  The patient returns to this office for an evaluation.  Past Medical History:  Diagnosis Date  . Cataracts, bilateral   . Chronic insomnia   . Chronic low back pain   . Gait disorder   . History of headache   . History of melanoma   . Hydrocephalus   . Hypertension   . Obesity   . Obstructive sleep apnea on CPAP   . Pancreatitis    Secondary to gallstones  . Pancreatitis due to biliary obstruction   . Peripheral edema    Chronic venous stasis  . Peripheral neuropathy     Past Surgical History:  Procedure Laterality Date  . CARPAL TUNNEL RELEASE Bilateral   . CHOLECYSTECTOMY    . ENDOVENOUS ABLATION SAPHENOUS VEIN W/ LASER Left 06/01/2017   endovenous laser ablation left greater saphenous vein by Josephina GipJames Lawson MD   . ENDOVENOUS ABLATION SAPHENOUS VEIN W/ LASER Right 06/27/2017   endovenous laser ablation right GSV  by Josephina GipJames Lawson MD  . INGUINAL HERNIA REPAIR    . LUMBAR SPINE SURGERY    . TONSILLECTOMY      Family History  Problem Relation Age of Onset  . Cancer Mother        Liver cancer  . Cancer Father        brain cancer  . Cancer Maternal Grandmother        Liver cancer  . Heart attack Maternal Grandfather     Social history:  reports that  has never  smoked. he has never used smokeless tobacco. He reports that he drinks alcohol. He reports that he does not use drugs.   No Known Allergies  Medications:  Prior to Admission medications   Medication Sig Start Date End Date Taking? Authorizing Provider  ammonium lactate (AMLACTIN) 12 % cream Apply 1 application topically at bedtime as needed for dry skin.  03/19/14  Yes [provider]  aspirin 81 MG tablet Take 81 mg by mouth daily.   Yes [provider]  atorvastatin (LIPITOR) 20 MG tablet Take 20 mg by mouth daily.  10/13/14  Yes [provider]  diclofenac (VOLTAREN) 75 MG EC tablet TAKE 1 TABLET BY MOUTH 2  TIMES DAILY WITH A MEAL 02/05/16  Yes York SpanielWillis, Charles K, MD  doxycycline (VIBRA-TABS) 100 MG tablet Take 1 tablet (100 mg total) by mouth every 12 (twelve) hours. 03/29/17  Yes Regalado, Belkys A, MD  furosemide (LASIX) 40 MG tablet Take 40 mg by mouth 2 (two) times daily.   Yes [provider]  gabapentin (NEURONTIN) 300 MG capsule Take 1 capsule (300 mg total) by mouth 3 (three) times daily. Patient taking differently: Take 600 mg by  mouth 3 (three) times daily.  02/29/16  Yes York SpanielWillis, Charles K, MD  hydrocerin (EUCERIN) CREA Apply 1 application topically daily. 03/30/17  Yes Regalado, Belkys A, MD  HYDROcodone-acetaminophen (NORCO) 7.5-325 MG tablet Take 1 tablet by mouth every 6 (six) hours as needed for moderate pain (Must last 28 days). 09/21/17  Yes York SpanielWillis, Charles K, MD  LORazepam (ATIVAN) 1 MG tablet Take 2 tablets 60 minutes prior to leaving house for office surgery. 06/22/17  Yes Pryor OchoaLawson, James D, MD  nortriptyline (PAMELOR) 10 MG capsule TAKE 3 CAPSULES (30 MG TOTAL) BY MOUTH EVERY EVENING. 04/11/15  Yes York SpanielWillis, Charles K, MD  ramipril (ALTACE) 10 MG capsule Take 10 mg by mouth 2 (two) times daily.   Yes [provider]  tiZANidine (ZANAFLEX) 4 MG tablet Take 1 tablet by mouth 3  times daily Patient taking differently: Take 4 mg by mouth at  bedtime.  04/21/16  Yes York SpanielWillis, Charles K, MD  traZODone (DESYREL) 50 MG tablet Take 1 tablet by mouth at bedtime. 03/19/14  Yes [provider]  triamcinolone cream (KENALOG) 0.1 % Apply 1 application topically daily as needed (for rash).    Yes [provider]    ROS:  Out of a complete 14 system review of symptoms, the patient complains only of the following symptoms, and all other reviewed systems are negative.  Ringing in the ears Restless legs, sleep apnea Joint pain, back pain, walking difficulty Weakness  Blood pressure 122/73, pulse 72, height 6\' 1"  (1.854 m), weight (!) 334 lb (151.5 kg).  Physical Exam  General: The patient is alert and cooperative at the time of the examination.  Patient is markedly obese.  Skin: 4+ edema is noted below the knees bilaterally.   Neurologic Exam  Mental status: The patient is alert and oriented x 3 at the time of the examination. The patient has apparent normal recent and remote memory, with an apparently normal attention span and concentration ability.   Cranial nerves: Facial symmetry is present. Speech is normal, no aphasia or dysarthria is noted. Extraocular movements are full. Visual fields are full.  Motor: The patient has good strength in all 4 extremities.  Sensory examination: Soft touch sensation is symmetric on the face, arms, and legs.  Coordination: The patient has good finger-nose-finger and heel-to-shin bilaterally.  Gait and station: The patient has a wide-based gait, the patient can walk independently, usually uses a cane for ambulation.  Tandem gait was not attempted.  Romberg is negative. No drift is seen.  Reflexes: Deep tendon reflexes are symmetric, but are depressed.   Assessment/Plan:  1.  Chronic low back pain  2.  Gait instability  The patient was given a prescription for the hydrocodone today.  He will remain on his other medications to include nortriptyline, and gabapentin.  He will  follow-up in 1 year, sooner if needed.  He will call if he desires to have another epidural steroid injection.  Marlan Palau. Keith [name] MD 10/14/2017 12:42 PM  [facility] [address] [place], [place] [id number]  Phone [phone] Fax [phone]

## 2017-12-15 ENCOUNTER — Telehealth: Payer: Self-pay | Admitting: Neurology

## 2017-12-15 MED ORDER — HYDROCODONE-ACETAMINOPHEN 7.5-325 MG PO TABS: 1.0000 | ORAL_TABLET | Freq: Four times a day (QID) | ORAL | 0 refills | Status: DC | PRN

## 2017-12-15 NOTE — Telephone Encounter (Signed)
Pt requesting a refill for HYDROcodone-acetaminophen (NORCO) 7.5-325 MG tablet aware that office closes tomorrow at 12

## 2017-12-15 NOTE — Telephone Encounter (Signed)
Called and LVM for pt to let him know rx hydrocodone can be sent directly to his pharmacy now by Dr. Anne HahnWillis. It was sent in. No need to pick up printed rx anymore. Advised him to call back if he has further questions/concerns.

## 2017-12-15 NOTE — Telephone Encounter (Signed)
The hydrocodone prescription will be sent in. 

## 2017-12-15 NOTE — Telephone Encounter (Signed)
Pt called to thank Dr Anne HahnWillis for the new protocol for RX. This patient was so appreciative!

## 2018-01-24 ENCOUNTER — Other Ambulatory Visit: Payer: Self-pay | Admitting: Neurology

## 2018-01-24 ENCOUNTER — Telehealth: Payer: Self-pay | Admitting: Neurology

## 2018-01-24 NOTE — Telephone Encounter (Signed)
The [place] registry was checked, hydrocodone will be refilled. 

## 2018-01-24 NOTE — Telephone Encounter (Signed)
Patient requesting refill of HYDROcodone-acetaminophen (NORCO) 7.5-325 MG tablet called to [facility].

## 2018-03-07 ENCOUNTER — Other Ambulatory Visit: Payer: Self-pay | Admitting: Neurology

## 2018-03-08 ENCOUNTER — Telehealth: Payer: Self-pay | Admitting: *Deleted

## 2018-03-08 NOTE — Telephone Encounter (Signed)
Rx registry checked and his last fill was 01/24/18 for qty 40. Last OV was 10/14/17 and next OV scheduled for 10/16/18.

## 2018-03-08 NOTE — Telephone Encounter (Signed)
Took call from phone staff and spoke w/ pt pharmacy: [facility]. They needed ICD-10 codes for pain medication he is taking. Provided: M54.5, G89.29. Nothing further needed.

## 2018-04-18 ENCOUNTER — Other Ambulatory Visit: Payer: Self-pay | Admitting: Neurology

## 2018-04-18 NOTE — Telephone Encounter (Signed)
Rx registry checked. Last fill date is 03/08/18 for #40. Last OV was 10/14/17 and next OV is 10/16/18.

## 2018-04-18 NOTE — Telephone Encounter (Signed)
Rx sent electronically.  

## 2018-05-24 ENCOUNTER — Other Ambulatory Visit: Payer: Self-pay | Admitting: Neurology

## 2018-06-28 ENCOUNTER — Other Ambulatory Visit: Payer: Self-pay | Admitting: Neurology

## 2018-08-04 ENCOUNTER — Other Ambulatory Visit: Payer: Self-pay | Admitting: Neurology

## 2018-09-19 ENCOUNTER — Other Ambulatory Visit: Payer: Self-pay | Admitting: Neurology

## 2018-10-16 ENCOUNTER — Ambulatory Visit: Payer: Medicare Other | Admitting: Neurology

## 2018-10-16 ENCOUNTER — Encounter: Payer: Self-pay | Admitting: Neurology

## 2018-10-16 VITALS — BP 116/67 | HR 98 | Ht 73.0 in | Wt 344.0 lb

## 2018-10-16 DIAGNOSIS — G8929 Other chronic pain: Secondary | ICD-10-CM

## 2018-10-16 DIAGNOSIS — R269 Unspecified abnormalities of gait and mobility: Secondary | ICD-10-CM | POA: Diagnosis not present

## 2018-10-16 DIAGNOSIS — M545 Low back pain: Secondary | ICD-10-CM

## 2018-10-16 NOTE — Progress Notes (Signed)
Reason for visit: Chronic low back pain  [name] is an 62 y.o. male  History of present illness:  [name] is a 62 year old right-handed white male with a history of obesity and chronic neck and low back pain.  The patient has a gait disorder, he reports no falls.  He lives alone, his wife is passed away 2 years ago.  The patient has not recently required any epidural steroid injections on the back.  He has noted some worsening of weakness with hip flexion on the left, he denies any hip pain.  He has not had any pain going down the legs on either side.  He walks with a cane currently.  He is on nortriptyline and gabapentin, he has good days and bad days with the back pain but overall he has been relatively stable in this regard.  He returns for an evaluation.  He will take hydrocodone occasionally if needed.  The patient claims that he sleeps fairly well at night.  Past Medical History:  Diagnosis Date  . Cataracts, bilateral   . Chronic insomnia   . Chronic low back pain   . Gait disorder   . History of headache   . History of melanoma   . Hydrocephalus (HCC)   . Hypertension   . Obesity   . Obstructive sleep apnea on CPAP   . Pancreatitis    Secondary to gallstones  . Pancreatitis due to biliary obstruction   . Peripheral edema    Chronic venous stasis  . Peripheral neuropathy     Past Surgical History:  Procedure Laterality Date  . CARPAL TUNNEL RELEASE Bilateral   . CHOLECYSTECTOMY    . ENDOVENOUS ABLATION SAPHENOUS VEIN W/ LASER Left 06/01/2017   endovenous laser ablation left greater saphenous vein by Josephina Gip MD   . ENDOVENOUS ABLATION SAPHENOUS VEIN W/ LASER Right 06/27/2017   endovenous laser ablation right GSV  by Josephina Gip MD  . INGUINAL HERNIA REPAIR    . LUMBAR SPINE SURGERY    . TONSILLECTOMY      Family History  Problem Relation Age of Onset  . Cancer Mother        Liver cancer  . Cancer Father        brain cancer  . Cancer Maternal  Grandmother        Liver cancer  . Heart attack Maternal Grandfather     Social history:  reports that he has never smoked. He has never used smokeless tobacco. He reports current alcohol use. He reports that he does not use drugs.   No Known Allergies  Medications:  Prior to Admission medications   Medication Sig Start Date End Date Taking? Authorizing Provider  ammonium lactate (AMLACTIN) 12 % cream Apply 1 application topically at bedtime as needed for dry skin.  03/19/14   [provider]  aspirin 81 MG tablet Take 81 mg by mouth daily.    [provider]  atorvastatin (LIPITOR) 20 MG tablet Take 20 mg by mouth daily.  10/13/14   [provider]  diclofenac (VOLTAREN) 75 MG EC tablet TAKE 1 TABLET BY MOUTH 2  TIMES DAILY WITH A MEAL 02/05/16   York Spaniel, MD  doxycycline (VIBRA-TABS) 100 MG tablet Take 1 tablet (100 mg total) by mouth every 12 (twelve) hours. 03/29/17   Regalado, Belkys A, MD  furosemide (LASIX) 40 MG tablet Take 40 mg by mouth 2 (two) times daily.    [provider]  gabapentin (NEURONTIN) 300 MG capsule Take 1 capsule (300 mg total) by mouth 3 (three) times daily. Patient taking differently: Take 600 mg by mouth 3 (three) times daily.  02/29/16   York SpanielWillis, Wilna Pennie K, MD  hydrocerin (EUCERIN) CREA Apply 1 application topically daily. 03/30/17   Regalado, Belkys A, MD  HYDROcodone-acetaminophen (NORCO) 7.5-325 MG tablet TAKE 1 TABLET BY MOUTH EVERY 6 HOURS AS NEEDED FOR MODERATE PAIN (MUST LAST 28 DAYS). 09/19/18   York SpanielWillis, Nayan Proch K, MD  LORazepam (ATIVAN) 1 MG tablet Take 2 tablets 60 minutes prior to leaving house for office surgery. 06/22/17   Pryor OchoaLawson, James D, MD  nortriptyline (PAMELOR) 10 MG capsule TAKE 3 CAPSULES (30 MG TOTAL) BY MOUTH EVERY EVENING. 04/11/15   York SpanielWillis, Kenady Doxtater K, MD  ramipril (ALTACE) 10 MG capsule Take 10 mg by mouth 2 (two) times daily.    [provider]  tiZANidine (ZANAFLEX) 4 MG tablet Take 1 tablet by  mouth 3  times daily Patient taking differently: Take 4 mg by mouth at bedtime.  04/21/16   York SpanielWillis, Taunya Goral K, MD  traZODone (DESYREL) 50 MG tablet Take 1 tablet by mouth at bedtime. 03/19/14   [provider]  triamcinolone cream (KENALOG) 0.1 % Apply 1 application topically daily as needed (for rash).     [provider]    ROS:  Out of a complete 14 system review of symptoms, the patient complains only of the following symptoms, and all other reviewed systems are negative.  Ringing in the ears Loss of vision, blurred vision Excessive thirst Constipation Sleep apnea Difficulty urinating decreased urine Back pain, walking difficulty, neck stiffness Numbness, weakness  Blood pressure 116/67, pulse 98, height 6\' 1"  (1.854 m), weight (!) 344 lb (156 kg).  Physical Exam  General: The patient is alert and cooperative at the time of the examination.  The patient is markedly obese.  Skin: The patient has significant woody edema below the knees bilaterally.   Neurologic Exam  Mental status: The patient is alert and oriented x 3 at the time of the examination. The patient has apparent normal recent and remote memory, with an apparently normal attention span and concentration ability.   Cranial nerves: Facial symmetry is present. Speech is normal, no aphasia or dysarthria is noted. Extraocular movements are full. Visual fields are full.  Motor: The patient has good strength in all 4 extremities, with exception that hip flexion on the left is slightly weak..  Sensory examination: Soft touch sensation is symmetric on the face, arms, and legs.  Coordination: The patient has good finger-nose-finger and heel-to-shin bilaterally.  Gait and station: The patient has a slightly wide-based gait, with walking, the left foot is rotated externally. Romberg is negative. No drift is seen.  Reflexes: Deep tendon reflexes are symmetric, but are depressed.   Assessment/Plan:  1.   Chronic neck and low back pain  2.  Gait disorder  The patient walks with a left foot externally rotated, he denies any hip discomfort.  I have recommended physical therapy for leg strengthening exercises, he does not wish to do this currently.  The patient is relatively stable with his pain syndrome currently.  He will follow-up in 1 year, sooner if needed.  Marlan Palau. Keith Kalkidan Caudell MD 10/16/2018 10:53 AM  [facility] [address] [place], [place] [id number]  Phone [phone] Fax [phone]

## 2018-10-27 ENCOUNTER — Other Ambulatory Visit: Payer: Self-pay | Admitting: Neurology

## 2018-12-11 ENCOUNTER — Telehealth: Payer: Self-pay

## 2018-12-11 MED ORDER — HYDROCODONE-ACETAMINOPHEN 7.5-325 MG PO TABS: ORAL_TABLET | ORAL | 0 refills | Status: DC

## 2018-12-11 NOTE — Telephone Encounter (Signed)
Pt is requesting a refill on Hydrocodone on 7.5 mg/325 mg. [place] drug registry verified. Last refill was written on 10/30/18 # 40 for a 28 day supply. Pt is requesting refill to be sent to [facility] on [address]

## 2018-12-11 NOTE — Addendum Note (Signed)
Addended by: York Spaniel on: 12/11/2018 01:04 PM   Modules accepted: Orders

## 2018-12-11 NOTE — Telephone Encounter (Signed)
The prescription for hydrocodone will be refilled. 

## 2019-01-24 ENCOUNTER — Other Ambulatory Visit: Payer: Self-pay | Admitting: Neurology

## 2019-01-24 NOTE — Telephone Encounter (Signed)
The [place] registry was checked, the hydrocodone will be refilled. 

## 2019-02-23 ENCOUNTER — Other Ambulatory Visit: Payer: Self-pay

## 2019-02-23 ENCOUNTER — Emergency Department (HOSPITAL_COMMUNITY)
Admission: EM | Admit: 2019-02-23 | Discharge: 2019-02-23 | Disposition: A | Discharge status: Home / Self Care | Payer: Medicare Other | Attending: Emergency Medicine | Admitting: Emergency Medicine

## 2019-02-23 ENCOUNTER — Emergency Department (HOSPITAL_COMMUNITY): Payer: Medicare Other

## 2019-02-23 ENCOUNTER — Encounter (HOSPITAL_COMMUNITY): Payer: Self-pay | Admitting: Emergency Medicine

## 2019-02-23 DIAGNOSIS — Z79899 Other long term (current) drug therapy: Secondary | ICD-10-CM | POA: Insufficient documentation

## 2019-02-23 DIAGNOSIS — I110 Hypertensive heart disease with heart failure: Secondary | ICD-10-CM | POA: Diagnosis not present

## 2019-02-23 DIAGNOSIS — R609 Edema, unspecified: Secondary | ICD-10-CM | POA: Diagnosis not present

## 2019-02-23 DIAGNOSIS — I5032 Chronic diastolic (congestive) heart failure: Secondary | ICD-10-CM | POA: Insufficient documentation

## 2019-02-23 LAB — CBC WITH DIFFERENTIAL/PLATELET
Abs Immature Granulocytes: 0.65 10*3/uL — ABNORMAL HIGH (ref 0.00–0.07)
Basophils Absolute: 0.1 10*3/uL (ref 0.0–0.1)
Basophils Relative: 1 %
Eosinophils Absolute: 0.6 10*3/uL — ABNORMAL HIGH (ref 0.0–0.5)
Eosinophils Relative: 5 %
HCT: 44.8 % (ref 39.0–52.0)
Hemoglobin: 14.3 g/dL (ref 13.0–17.0)
Immature Granulocytes: 6 %
Lymphocytes Relative: 9 %
Lymphs Abs: 1.1 10*3/uL (ref 0.7–4.0)
MCH: 31.6 pg (ref 26.0–34.0)
MCHC: 31.9 g/dL (ref 30.0–36.0)
MCV: 98.9 fL (ref 80.0–100.0)
Monocytes Absolute: 1 10*3/uL (ref 0.1–1.0)
Monocytes Relative: 8 %
Neutro Abs: 8.5 10*3/uL — ABNORMAL HIGH (ref 1.7–7.7)
Neutrophils Relative %: 71 %
Platelets: 259 10*3/uL (ref 150–400)
RBC: 4.53 MIL/uL (ref 4.22–5.81)
RDW: 13.9 % (ref 11.5–15.5)
WBC: 11.9 10*3/uL — ABNORMAL HIGH (ref 4.0–10.5)
nRBC: 0 % (ref 0.0–0.2)

## 2019-02-23 LAB — BASIC METABOLIC PANEL
Anion gap: 10 (ref 5–15)
BUN: 16 mg/dL (ref 8–23)
CO2: 26 mmol/L (ref 22–32)
Calcium: 8.1 mg/dL — ABNORMAL LOW (ref 8.9–10.3)
Chloride: 100 mmol/L (ref 98–111)
Creatinine, Ser: 0.65 mg/dL (ref 0.61–1.24)
GFR calc Af Amer: >60 mL/min (ref >60)
GFR calc non Af Amer: >60 mL/min (ref >60)
Glucose, Bld: 100 mg/dL — ABNORMAL HIGH (ref 70–99)
Potassium: 4 mmol/L (ref 3.5–5.1)
Sodium: 136 mmol/L (ref 135–145)
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
## 2019-02-23 NOTE — ED Provider Notes (Signed)
[facility]-EMERGENCY DEPT Provider Note   CSN: [id number] Arrival date & time: 02/23/19  1105    History   Chief Complaint Chief Complaint  Patient presents with  . Back Pain  . Neck Pain  . Weakness  . Fall    HPI [name] is a 63 y.o. male.     HPI   He presents for evaluation of difficulty walking, which he suspects is due to the swelling in his legs.  He takes diuretics usually, but in the last 3 days because he could not walk, he stopped taking his Lasix.  He denies fever, chills, cough, shortness of breath, chest pain, focal weakness or paresthesia.  He states "I cannot feel my legs for the last 20 years."  He has chronic low back pain for which he takes hydrocodone, twice a day.  His pain is treated by his neurologist.  There are no other known modifying factors.  Past Medical History:  Diagnosis Date  . Cataracts, bilateral   . Chronic insomnia   . Chronic low back pain   . Gait disorder   . History of headache   . History of melanoma   . Hydrocephalus (HCC)   . Hypertension   . Obesity   . Obstructive sleep apnea on CPAP   . Pancreatitis    Secondary to gallstones  . Pancreatitis due to biliary obstruction   . Peripheral edema    Chronic venous stasis  . Peripheral neuropathy     Patient Active Problem List   Diagnosis Date Noted  . Varicose veins of bilateral lower extremities with other complications 05/23/2017  . Cellulitis of right lower extremity   . Sepsis due to cellulitis (HCC) 03/21/2017  . Essential hypertension 03/21/2017  . Chronic diastolic CHF (congestive heart failure) (HCC) 03/21/2017  . Renal insufficiency 03/21/2017  . Meralgia paresthetica 10/12/2013  . Polyneuropathy in other diseases classified elsewhere (HCC) 11/22/2012  . Obstructive sleep apnea (adult) (pediatric) 11/22/2012  . Abnormality of gait 11/22/2012  . Headache(784.0) 11/22/2012  . Pain in limb 11/22/2012  . Lumbago 11/22/2012  .  Cervicalgia 11/22/2012  . Spinal stenosis in cervical region 11/22/2012  . Degeneration of intervertebral disc, site unspecified 11/22/2012  . Degeneration of cervical intervertebral disc 11/22/2012  . Obstructive hydrocephalus (HCC) 11/22/2012    Past Surgical History:  Procedure Laterality Date  . CARPAL TUNNEL RELEASE Bilateral   . CHOLECYSTECTOMY    . ENDOVENOUS ABLATION SAPHENOUS VEIN W/ LASER Left 06/01/2017   endovenous laser ablation left greater saphenous vein by Josephina Gip MD   . ENDOVENOUS ABLATION SAPHENOUS VEIN W/ LASER Right 06/27/2017   endovenous laser ablation right GSV  by Josephina Gip MD  . INGUINAL HERNIA REPAIR    . LUMBAR SPINE SURGERY    . TONSILLECTOMY          Home Medications    Prior to Admission medications   Medication Sig Start Date End Date Taking? Authorizing Provider  ammonium lactate (AMLACTIN) 12 % cream Apply 1 application topically at bedtime as needed for dry skin.  03/19/14  Yes [provider]  aspirin 81 MG tablet Take 81 mg by mouth daily.   Yes [provider]  atorvastatin (LIPITOR) 20 MG tablet Take 20 mg by mouth daily.  10/13/14  Yes [provider]  furosemide (LASIX) 40 MG tablet Take 40 mg by mouth daily.    Yes [provider]  gabapentin (NEURONTIN) 300 MG capsule Take 1 capsule (300  mg total) by mouth 3 (three) times daily. Patient taking differently: Take 900 mg by mouth 3 (three) times daily.  02/29/16  Yes York SpanielWillis, Charles K, MD  HYDROcodone-acetaminophen (NORCO) 7.5-325 MG tablet TAKE 1 TABLET BY MOUTH EVERY 6 HOURS AS NEEDED FOR MODERATE PAIN (MUST LAST 28 DAYS). Patient taking differently: Take 1 tablet by mouth at bedtime. TAKE 1 TABLET BY MOUTH EVERY 6 HOURS AS NEEDED FOR MODERATE PAIN (MUST LAST 28 DAYS). 01/24/19  Yes York SpanielWillis, Charles K, MD  nortriptyline (PAMELOR) 10 MG capsule TAKE 3 CAPSULES (30 MG TOTAL) BY MOUTH EVERY EVENING. 04/11/15  Yes York SpanielWillis, Charles K, MD  ramipril (ALTACE) 10  MG capsule Take 10 mg by mouth 2 (two) times daily.   Yes [provider]  ropinirole (REQUIP) 5 MG tablet Take 5 mg by mouth daily.    Yes [provider]  tiZANidine (ZANAFLEX) 4 MG tablet Take 1 tablet by mouth 3  times daily Patient taking differently: Take 4 mg by mouth daily.  04/21/16  Yes York SpanielWillis, Charles K, MD  traZODone (DESYREL) 50 MG tablet Take 1 tablet by mouth at bedtime. 03/19/14  Yes [provider]  triamcinolone cream (KENALOG) 0.1 % Apply 1 application topically daily as needed (for rash).    Yes [provider]  diclofenac (VOLTAREN) 75 MG EC tablet TAKE 1 TABLET BY MOUTH 2  TIMES DAILY WITH A MEAL Patient not taking: Reported on 02/23/2019 02/05/16   York SpanielWillis, Charles K, MD  doxycycline (VIBRA-TABS) 100 MG tablet Take 1 tablet (100 mg total) by mouth every 12 (twelve) hours. Patient not taking: Reported on 02/23/2019 03/29/17   Regalado, Jon BillingsBelkys A, MD  hydrocerin (EUCERIN) CREA Apply 1 application topically daily. Patient not taking: Reported on 02/23/2019 03/30/17   Regalado, Jon BillingsBelkys A, MD  LORazepam (ATIVAN) 1 MG tablet Take 2 tablets 60 minutes prior to leaving house for office surgery. Patient not taking: Reported on 02/23/2019 06/22/17   Pryor OchoaLawson, James D, MD    Family History Family History  Problem Relation Age of Onset  . Cancer Mother        Liver cancer  . Cancer Father        brain cancer  . Cancer Maternal Grandmother        Liver cancer  . Heart attack Maternal Grandfather     Social History Social History   Tobacco Use  . Smoking status: Never Smoker  . Smokeless tobacco: Never Used  Substance Use Topics  . Alcohol use: Yes    Alcohol/week: 0.0 standard drinks    Comment: Consumes alcohol on occasion  . Drug use: No     Allergies   Patient has no known allergies.   Review of Systems Review of Systems  All other systems reviewed and are negative.    Physical Exam Updated Vital Signs BP (!) 145/79 (BP Location: Left  Arm)   Pulse 95   Temp 98.3 F (36.8 C) (Oral)   Resp 20   SpO2 100%   Physical Exam Vitals signs and nursing note reviewed.  Constitutional:      General: He is not in acute distress.    Appearance: He is well-developed. He is obese. He is not ill-appearing, toxic-appearing or diaphoretic.  HENT:     Head: Normocephalic and atraumatic.     Right Ear: External ear normal.     Left Ear: External ear normal.  Eyes:     Conjunctiva/sclera: Conjunctivae normal.     Pupils: Pupils are equal,  round, and reactive to light.  Neck:     Musculoskeletal: Normal range of motion and neck supple.     Trachea: Phonation normal.  Cardiovascular:     Rate and Rhythm: Normal rate and regular rhythm.     Heart sounds: Normal heart sounds.  Pulmonary:     Effort: Pulmonary effort is normal.     Breath sounds: Normal breath sounds.  Abdominal:     Palpations: Abdomen is soft.     Tenderness: There is abdominal tenderness (Epigastric, mild).  Musculoskeletal: Normal range of motion.     Comments: Bilateral chronic appearing leg edema below knees.  The edema is somewhat masked by obesity but is likely 3-4+.  Minimal erythema right lower leg, greater than left lower leg.  No asymmetry of legs.  Skin:    General: Skin is warm and dry.  Neurological:     Mental Status: He is alert and oriented to person, place, and time.     Cranial Nerves: No cranial nerve deficit.     Sensory: No sensory deficit.     Motor: No abnormal muscle tone.     Coordination: Coordination normal.  Psychiatric:        Behavior: Behavior normal.        Thought Content: Thought content normal.        Judgment: Judgment normal.      ED Treatments / Results  Labs (all labs ordered are listed, but only abnormal results are displayed) Labs Reviewed  BASIC METABOLIC PANEL - Abnormal; Notable for the following components:      Result Value   Glucose, Bld 100 (*)    Calcium 8.1 (*)    All other components within normal  limits  CBC WITH DIFFERENTIAL/PLATELET - Abnormal; Notable for the following components:   WBC 11.9 (*)    Neutro Abs 8.5 (*)    Eosinophils Absolute 0.6 (*)    Abs Immature Granulocytes 0.65 (*)    All other components within normal limits    EKG None  Radiology Dg Chest 2 View  Result Date: 02/23/2019 CLINICAL DATA:  63 year old male with left-sided chest pain EXAM: CHEST - 2 VIEW COMPARISON:  05/12/2017 FINDINGS: Cardiomediastinal silhouette unchanged in size and contour. No evidence of interlobular septal thickening. Asymmetric elevation of the left hemidiaphragm. Blunting of the costophrenic sulcus on the lateral view. No pneumothorax. No confluent airspace disease. Similar appearance of chronic interstitial opacities. IMPRESSION: Chronic lung changes with questionable tiny pleural effusions and no evidence of overt edema. Electronically Signed   By: Gilmer Mor D.O.   On: 02/23/2019 12:34    Procedures Procedures (including critical care time)  Medications Ordered in ED Medications - No data to display   Initial Impression / Assessment and Plan / ED Course  I have reviewed the triage vital signs and the nursing notes.  Pertinent labs & imaging results that were available during my care of the patient were reviewed by me and considered in my medical decision making (see chart for details).  Clinical Course as of Feb 22 1329  Fri Feb 23, 2019  1327 Normal except white count elevated  CBC with Differential(!) [EW]  1327 Normal except glucose high, calcium low  Basic metabolic panel(!) [EW]  1327 No infiltrate or CHF, images reviewed by me  DG Chest 2 View [EW]    Clinical Course User Index [EW] Mancel Bale, MD        Patient Vitals for the past 24 hrs:  BP  Temp Temp src Pulse Resp SpO2  02/23/19 1125 (!) 145/79 98.3 F (36.8 C) Oral 95 20 100 %    1:27 PM Reevaluation with update and discussion. After initial assessment and treatment, an updated evaluation  reveals no change in clinical status.  Findings discussed with the patient and all questions were answered. Mancel Bale   Medical Decision Making: Leg edema with difficulty walking and chronic back pain.  Patient is avoiding taking Lasix because of trouble ambulating.  No evidence for acute unstable congestive heart failure.  Edema appears largely chronic.  Doubt serious bacterial infection, metabolic instability or impending vascular collapse.  Doubt lumbar or spinal myelopathy.  No indication for further ED evaluation at this time.  Home health services initiated by consultation with case management.  CRITICAL CARE-no Performed by: Mancel Bale  Nursing Notes Reviewed/ Care Coordinated Applicable Imaging Reviewed Interpretation of Laboratory Data incorporated into ED treatment  The patient appears reasonably screened and/or stabilized for discharge and I doubt any other medical condition or other [facility] requiring further screening, evaluation, or treatment in the ED at this time prior to discharge.  Plan: Home Medications-continue usual medications including Lasix as prescribed; Home Treatments-low-salt diet; return here if the recommended treatment, does not improve the symptoms; Recommended follow up-PCP checkup 1 week and as needed   Final Clinical Impressions(s) / ED Diagnoses   Final diagnoses:  Peripheral edema    ED Discharge Orders    None       Mancel Bale, MD 02/23/19 1330

## 2019-02-23 NOTE — ED Notes (Signed)
RN and NT unable to obtain labs. Phlebotomy called.

## 2019-02-23 NOTE — Discharge Instructions (Signed)
Take your Lasix as prescribed.  Also stay on a low-salt diet to decrease the amount of swelling.  Call your PCP for follow-up appointment next week.  Return here, if needed, for problems.

## 2019-02-23 NOTE — TOC Initial Note (Signed)
Transition of Care [facility]) - Initial/Assessment Note    Patient Details  Name: [name] MRN: [id number] Date of Birth: 1956-06-16  Transition of [facility]) CM/SW Contact:    Elliot Cousin, RN Phone Number: 02/23/2019, 4:38 PM  Clinical Narrative:                 Spoke to pt at bedside and offered choice for [facility]. Pt states he had [facility] in the past. Has DME. His dtr will assist as needed with transportation. Has friend, Mosetta Anis that stays with him at night.   Expected Discharge Plan: Home w Home Health Services Barriers to Discharge: No Barriers Identified   Patient Goals and CMS Choice Patient states their goals for this hospitalization and ongoing recovery are:: be able to walk CMS Medicare.gov Compare Post Acute Care list provided to:: Patient Choice offered to / list presented to : Patient  Expected Discharge Plan and Services Expected Discharge Plan: Home w Home Health Services In-house Referral: NA Discharge Planning Services: CM Consult Post Acute Care Choice: Home Health Living arrangements for the past 2 months: Single Family Home                 DME Arranged: N/A DME Agency: NA       HH Arranged: RN, PT, OT, Nurse's Aide, Social Work [employer] Agency: [facility] Date HH Agency Contacted: 02/23/19 Time HH Agency Contacted: 1636 Representative spoke with at [facility] Agency: Alfonse Flavors  Prior Living Arrangements/Services Living arrangements for the past 2 months: Single Family Home Lives with:: Self   Do you feel safe going back to the place where you live?: Yes      Need for Family Participation in Patient Care: Yes (Comment) Care giver support system in place?: Yes (comment) Current home services: DME(wheelchair, RW, cane) Criminal Activity/Legal Involvement Pertinent to Current Situation/Hospitalization: No - Comment as needed  Activities of Daily Living      Permission Sought/Granted Permission sought to share  information with : Case Manager, Family Supports, PCP Permission granted to share information with : Yes, Verbal Permission Granted  Share Information with NAME: Senaida Ores  Permission granted to share info w AGENCY: [facility]  Permission granted to share info w Relationship: daughter  Permission granted to share info w Contact Information: [phone]  Emotional Assessment Appearance:: Appears stated age Attitude/Demeanor/Rapport: Engaged Affect (typically observed): Accepting Orientation: : Oriented to Self, Oriented to Place, Oriented to  Time, Oriented to Situation   Psych Involvement: No (comment)  Admission diagnosis:  neck and back pain edema Patient Active Problem List   Diagnosis Date Noted  . Varicose veins of bilateral lower extremities with other complications 05/23/2017  . Cellulitis of right lower extremity   . Sepsis due to cellulitis (HCC) 03/21/2017  . Essential hypertension 03/21/2017  . Chronic diastolic CHF (congestive heart failure) (HCC) 03/21/2017  . Renal insufficiency 03/21/2017  . Meralgia paresthetica 10/12/2013  . Polyneuropathy in other diseases classified elsewhere (HCC) 11/22/2012  . Obstructive sleep apnea (adult) (pediatric) 11/22/2012  . Abnormality of gait 11/22/2012  . Headache(784.0) 11/22/2012  . Pain in limb 11/22/2012  . Lumbago 11/22/2012  . Cervicalgia 11/22/2012  . Spinal stenosis in cervical region 11/22/2012  . Degeneration of intervertebral disc, site unspecified 11/22/2012  . Degeneration of cervical intervertebral disc 11/22/2012  . Obstructive hydrocephalus (HCC) 11/22/2012   PCP:  Ileana Ladd, MD Pharmacy:   [facility] - [place], [place] - [address]  [facility] [address] [place] [address] [place] [address] Phone: [phone] Fax: [phone]  [facility] [place] - [id number] [facility] [address] [facility] [address] [place] [address] Phone: [phone] Fax:  [phone]     Social Determinants of Health (SDOH) Interventions    Readmission Risk Interventions No flowsheet data found.

## 2019-02-23 NOTE — ED Notes (Signed)
Report called to [facility]  

## 2019-02-23 NOTE — ED Notes (Signed)
Bed: WA03 Expected date:  Expected time:  Means of arrival:  Comments: EMS-neck/back pain-edema

## 2019-02-23 NOTE — Progress Notes (Signed)
     Home health agencies that serve [id number]. Your favorite home health agencies  Home Health Agencies Search Results  Results List Table Home Health Agency Information Quality of Patient Care Rating Patient Survey Summary Rating  [facility]  [phone] 3 out of 5 stars 5 out of 5 stars  [facility]  [phone] 3 out of 5 stars 4 out of 5 stars  [facility]  [phone] 4  out of 5 stars 3 out of 5 stars  [facility]  [phone] 4 out of 5 stars 4 out of 5 stars  [facility]  [phone] 4 out of 5 stars 4 out of 5 stars  [facility]   [phone] 3  out of 5 stars 4 out of 5 stars  [facility]  [phone] 3 out of 5 stars 4 out of 5 stars  [facility]  [phone] 4 out of 5 stars Not Available  [facility]  [phone] 3 out of 5 stars 4 out of 5 stars  [facility]  [phone] 3  out of 5 stars 3 out of 5 stars  [facility]  [phone] 3  out of 5 stars 4 out of 5 stars  [facility]  [phone] 3  out of 5 stars 3 out of 5 stars  [facility]  [phone] 4  out of 5 stars 3 out of 5 stars  [facility]  [phone] 4  out of 5 stars 2 out of 5 stars   Home Health Footnotes Footnote number Footnote as displayed on Home Health Compare  1 This agency provides services under a federal waiver program to non-traditional, chronic long term population.  2 This agency provides services to a special needs population.  3 Not Available.  4 The number of patient episodes for this measure is too small to report.  5 This measure currently does not have data or provider has been certified/recertified for less than 6 months.  6 The national average for this measure is not provided because of state-to-state differences in data collection.  7 Medicare is not  displaying rates for this measure for any home health agency, because of an issue with the data.  8 There were problems with the data and they are being corrected.  9 Zero, or very few, patients met the survey's rules for inclusion. The scores shown, if any, reflect a very small number of surveys and may not accurately tell how an agency is doing.  10 Survey results are based on less than 12 months of data.  11 Fewer than 70 patients completed the survey. Use the scores shown, if any, with caution as the number of surveys may be too low to accurately tell how an agency is doing.  12 No survey results are available for this period.  13 Data suppressed by CMS for one or more quarters.    

## 2019-02-27 ENCOUNTER — Other Ambulatory Visit: Payer: Self-pay | Admitting: Neurology

## 2019-02-27 NOTE — Telephone Encounter (Signed)
Pt is due for a refill on hydrocodone. [facility] checked. Will send to Dr. Anne Hahn for review.

## 2019-03-01 ENCOUNTER — Telehealth: Payer: Self-pay | Admitting: Neurology

## 2019-03-01 NOTE — Telephone Encounter (Signed)
Occupational Therapist Apolinar Junes @ [facility] has called for verbal orders for Occupational  Therapy for 1 week 1, 2 week 2 ,1 week 2

## 2019-03-01 NOTE — Telephone Encounter (Signed)
I contacted the occupational therapist and provided verbal order for OT.

## 2019-03-14 ENCOUNTER — Emergency Department (HOSPITAL_COMMUNITY): Payer: Medicare Other

## 2019-03-14 ENCOUNTER — Inpatient Hospital Stay (HOSPITAL_COMMUNITY)
Admission: EM | Admit: 2019-03-14 | Discharge: 2019-03-16 | DRG: 872 | Disposition: A | Discharge status: Home Health | Payer: Medicare Other | Attending: Family Medicine | Admitting: Family Medicine

## 2019-03-14 ENCOUNTER — Encounter (HOSPITAL_COMMUNITY): Payer: Self-pay | Admitting: Emergency Medicine

## 2019-03-14 DIAGNOSIS — G919 Hydrocephalus, unspecified: Secondary | ICD-10-CM | POA: Diagnosis present

## 2019-03-14 DIAGNOSIS — L03115 Cellulitis of right lower limb: Secondary | ICD-10-CM | POA: Diagnosis present

## 2019-03-14 DIAGNOSIS — R319 Hematuria, unspecified: Secondary | ICD-10-CM | POA: Diagnosis present

## 2019-03-14 DIAGNOSIS — Z20828 Contact with and (suspected) exposure to other viral communicable diseases: Secondary | ICD-10-CM | POA: Diagnosis present

## 2019-03-14 DIAGNOSIS — T368X5A Adverse effect of other systemic antibiotics, initial encounter: Secondary | ICD-10-CM | POA: Diagnosis not present

## 2019-03-14 DIAGNOSIS — A419 Sepsis, unspecified organism: Principal | ICD-10-CM | POA: Diagnosis present

## 2019-03-14 DIAGNOSIS — M549 Dorsalgia, unspecified: Secondary | ICD-10-CM

## 2019-03-14 DIAGNOSIS — L270 Generalized skin eruption due to drugs and medicaments taken internally: Secondary | ICD-10-CM | POA: Diagnosis not present

## 2019-03-14 DIAGNOSIS — R109 Unspecified abdominal pain: Secondary | ICD-10-CM | POA: Diagnosis present

## 2019-03-14 DIAGNOSIS — M545 Low back pain: Secondary | ICD-10-CM | POA: Diagnosis present

## 2019-03-14 DIAGNOSIS — R6520 Severe sepsis without septic shock: Secondary | ICD-10-CM | POA: Diagnosis present

## 2019-03-14 DIAGNOSIS — I110 Hypertensive heart disease with heart failure: Secondary | ICD-10-CM | POA: Diagnosis present

## 2019-03-14 DIAGNOSIS — G2581 Restless legs syndrome: Secondary | ICD-10-CM | POA: Diagnosis present

## 2019-03-14 DIAGNOSIS — G4733 Obstructive sleep apnea (adult) (pediatric): Secondary | ICD-10-CM | POA: Diagnosis present

## 2019-03-14 DIAGNOSIS — I5032 Chronic diastolic (congestive) heart failure: Secondary | ICD-10-CM | POA: Diagnosis present

## 2019-03-14 DIAGNOSIS — L03119 Cellulitis of unspecified part of limb: Secondary | ICD-10-CM

## 2019-03-14 DIAGNOSIS — G8929 Other chronic pain: Secondary | ICD-10-CM | POA: Diagnosis present

## 2019-03-14 DIAGNOSIS — I959 Hypotension, unspecified: Secondary | ICD-10-CM

## 2019-03-14 DIAGNOSIS — J9811 Atelectasis: Secondary | ICD-10-CM | POA: Diagnosis present

## 2019-03-14 DIAGNOSIS — R296 Repeated falls: Secondary | ICD-10-CM | POA: Diagnosis present

## 2019-03-14 DIAGNOSIS — N3941 Urge incontinence: Secondary | ICD-10-CM | POA: Diagnosis present

## 2019-03-14 DIAGNOSIS — W19XXXA Unspecified fall, initial encounter: Secondary | ICD-10-CM | POA: Diagnosis not present

## 2019-03-14 DIAGNOSIS — T1490XA Injury, unspecified, initial encounter: Secondary | ICD-10-CM

## 2019-03-14 DIAGNOSIS — I878 Other specified disorders of veins: Secondary | ICD-10-CM | POA: Diagnosis present

## 2019-03-14 DIAGNOSIS — Y92239 Unspecified place in hospital as the place of occurrence of the external cause: Secondary | ICD-10-CM | POA: Diagnosis not present

## 2019-03-14 DIAGNOSIS — I872 Venous insufficiency (chronic) (peripheral): Secondary | ICD-10-CM | POA: Diagnosis present

## 2019-03-14 DIAGNOSIS — F329 Major depressive disorder, single episode, unspecified: Secondary | ICD-10-CM | POA: Diagnosis present

## 2019-03-14 DIAGNOSIS — Z6841 Body Mass Index (BMI) 40.0 and over, adult: Secondary | ICD-10-CM | POA: Diagnosis not present

## 2019-03-14 DIAGNOSIS — I890 Lymphedema, not elsewhere classified: Secondary | ICD-10-CM | POA: Diagnosis present

## 2019-03-14 DIAGNOSIS — G4700 Insomnia, unspecified: Secondary | ICD-10-CM | POA: Diagnosis present

## 2019-03-14 DIAGNOSIS — Z7982 Long term (current) use of aspirin: Secondary | ICD-10-CM

## 2019-03-14 DIAGNOSIS — R55 Syncope and collapse: Secondary | ICD-10-CM | POA: Diagnosis present

## 2019-03-14 LAB — CBC WITH DIFFERENTIAL/PLATELET
Abs Immature Granulocytes: 0.2 10*3/uL — ABNORMAL HIGH (ref 0.00–0.07)
Basophils Absolute: 0.1 10*3/uL (ref 0.0–0.1)
Basophils Relative: 0 %
Eosinophils Absolute: 0.2 10*3/uL (ref 0.0–0.5)
Eosinophils Relative: 1 %
HCT: 42 % (ref 39.0–52.0)
Hemoglobin: 13.5 g/dL (ref 13.0–17.0)
Immature Granulocytes: 1 %
Lymphocytes Relative: 6 %
Lymphs Abs: 1.2 10*3/uL (ref 0.7–4.0)
MCH: 32 pg (ref 26.0–34.0)
MCHC: 32.1 g/dL (ref 30.0–36.0)
MCV: 99.5 fL (ref 80.0–100.0)
Monocytes Absolute: 1.3 10*3/uL — ABNORMAL HIGH (ref 0.1–1.0)
Monocytes Relative: 8 %
Neutro Abs: 15 10*3/uL — ABNORMAL HIGH (ref 1.7–7.7)
Neutrophils Relative %: 84 %
Platelets: 254 10*3/uL (ref 150–400)
RBC: 4.22 MIL/uL (ref 4.22–5.81)
RDW: 13.5 % (ref 11.5–15.5)
WBC: 17.9 10*3/uL — ABNORMAL HIGH (ref 4.0–10.5)
nRBC: 0 % (ref 0.0–0.2)

## 2019-03-14 LAB — URINALYSIS, ROUTINE W REFLEX MICROSCOPIC
Bilirubin Urine: NEGATIVE
Glucose, UA: NEGATIVE mg/dL
Ketones, ur: NEGATIVE mg/dL
Leukocytes,Ua: NEGATIVE
Nitrite: NEGATIVE
Protein, ur: NEGATIVE mg/dL
Specific Gravity, Urine: 1.011 (ref 1.005–1.030)
pH: 6 (ref 5.0–8.0)

## 2019-03-14 LAB — LACTIC ACID, PLASMA
Lactic Acid, Venous: 2.2 mmol/L (ref 0.5–1.9)
Lactic Acid, Venous: 1.5 mmol/L (ref 0.5–1.9)

## 2019-03-14 LAB — COMPREHENSIVE METABOLIC PANEL
ALT: 14 U/L (ref 0–44)
AST: 15 U/L (ref 15–41)
Albumin: 2.8 g/dL — ABNORMAL LOW (ref 3.5–5.0)
Alkaline Phosphatase: 45 U/L (ref 38–126)
Anion gap: 10 (ref 5–15)
BUN: 14 mg/dL (ref 8–23)
CO2: 27 mmol/L (ref 22–32)
Calcium: 8.3 mg/dL — ABNORMAL LOW (ref 8.9–10.3)
Chloride: 101 mmol/L (ref 98–111)
Creatinine, Ser: 1.2 mg/dL (ref 0.61–1.24)
GFR calc Af Amer: >60 mL/min (ref >60)
GFR calc non Af Amer: >60 mL/min (ref >60)
Glucose, Bld: 96 mg/dL (ref 70–99)
Potassium: 4 mmol/L (ref 3.5–5.1)
Sodium: 138 mmol/L (ref 135–145)
Total Bilirubin: 0.8 mg/dL (ref 0.3–1.2)
Total Protein: 7 g/dL (ref 6.5–8.1)

## 2019-03-14 LAB — TROPONIN I: Troponin I: <0.03 ng/mL (ref <0.03)

## 2019-03-14 LAB — RAPID URINE DRUG SCREEN, HOSP PERFORMED
Amphetamines: NOT DETECTED
Barbiturates: NOT DETECTED
Benzodiazepines: NOT DETECTED
Cocaine: NOT DETECTED
Opiates: POSITIVE — AB
Tetrahydrocannabinol: NOT DETECTED

## 2019-03-14 LAB — MRSA PCR SCREENING: MRSA by PCR: NEGATIVE

## 2019-03-14 LAB — ETHANOL: Alcohol, Ethyl (B): <10 mg/dL (ref <10)

## 2019-03-14 LAB — SARS CORONAVIRUS 2 BY RT PCR (HOSPITAL ORDER, PERFORMED IN ~~LOC~~ HOSPITAL LAB): SARS Coronavirus 2: NEGATIVE

## 2019-03-14 MED ORDER — VANCOMYCIN HCL 10 G IV SOLR
2500.0000 mg | Freq: Once | INTRAVENOUS | Status: AC
  Administered 2019-03-14: 2500 mg via INTRAVENOUS
  Filled 2019-03-14: qty 2500

## 2019-03-14 MED ORDER — DOXYCYCLINE HYCLATE 100 MG PO TABS
100.0000 mg | ORAL_TABLET | Freq: Two times a day (BID) | ORAL | Status: DC
  Administered 2019-03-14 – 2019-03-16 (×4): 100 mg via ORAL
  Filled 2019-03-14 (×4): qty 1

## 2019-03-14 MED ORDER — IOHEXOL 300 MG/ML  SOLN
100.0000 mL | Freq: Once | INTRAMUSCULAR | Status: AC | PRN
  Administered 2019-03-14: 100 mL via INTRAVENOUS

## 2019-03-14 MED ORDER — METRONIDAZOLE IN NACL 5-0.79 MG/ML-% IV SOLN
500.0000 mg | Freq: Once | INTRAVENOUS | Status: AC
  Administered 2019-03-14: 500 mg via INTRAVENOUS
  Filled 2019-03-14: qty 100

## 2019-03-14 MED ORDER — FUROSEMIDE 40 MG PO TABS
40.0000 mg | ORAL_TABLET | Freq: Two times a day (BID) | ORAL | Status: DC
  Administered 2019-03-14 – 2019-03-16 (×4): 40 mg via ORAL
  Filled 2019-03-14 (×4): qty 1

## 2019-03-14 MED ORDER — POLYETHYLENE GLYCOL 3350 17 G PO PACK: 17.0000 g | PACK | Freq: Every day | ORAL | Status: DC | PRN

## 2019-03-14 MED ORDER — SODIUM CHLORIDE 0.9 % IV BOLUS: 1000.0000 mL | Freq: Once | INTRAVENOUS | Status: DC

## 2019-03-14 MED ORDER — SODIUM CHLORIDE 0.9 % IV BOLUS
1000.0000 mL | Freq: Once | INTRAVENOUS | Status: AC
  Administered 2019-03-14: 1000 mL via INTRAVENOUS

## 2019-03-14 MED ORDER — NORTRIPTYLINE HCL 10 MG PO CAPS
20.0000 mg | ORAL_CAPSULE | Freq: Every day | ORAL | Status: DC
  Administered 2019-03-14 – 2019-03-15 (×2): 20 mg via ORAL
  Filled 2019-03-14 (×3): qty 2

## 2019-03-14 MED ORDER — SODIUM CHLORIDE 0.9 % IV SOLN
2.0000 g | Freq: Once | INTRAVENOUS | Status: AC
  Administered 2019-03-14: 2 g via INTRAVENOUS
  Filled 2019-03-14: qty 2

## 2019-03-14 MED ORDER — GABAPENTIN 300 MG PO CAPS
300.0000 mg | ORAL_CAPSULE | Freq: Three times a day (TID) | ORAL | Status: DC
  Administered 2019-03-14 – 2019-03-16 (×5): 300 mg via ORAL
  Filled 2019-03-14 (×5): qty 1

## 2019-03-14 MED ORDER — SODIUM CHLORIDE 0.9 % IV SOLN
2.0000 g | Freq: Three times a day (TID) | INTRAVENOUS | Status: DC
  Administered 2019-03-14 – 2019-03-15 (×2): 2 g via INTRAVENOUS
  Filled 2019-03-14 (×4): qty 2

## 2019-03-14 MED ORDER — TIZANIDINE HCL 4 MG PO TABS: 4.0000 mg | ORAL_TABLET | Freq: Three times a day (TID) | ORAL | Status: DC

## 2019-03-14 MED ORDER — ROPINIROLE HCL 1 MG PO TABS
5.0000 mg | ORAL_TABLET | Freq: Every day | ORAL | Status: DC
  Administered 2019-03-15 – 2019-03-16 (×2): 5 mg via ORAL
  Filled 2019-03-14 (×4): qty 5

## 2019-03-14 MED ORDER — ACETAMINOPHEN 325 MG PO TABS
650.0000 mg | ORAL_TABLET | Freq: Four times a day (QID) | ORAL | Status: DC
  Administered 2019-03-14 – 2019-03-16 (×6): 650 mg via ORAL
  Filled 2019-03-14 (×6): qty 2

## 2019-03-14 MED ORDER — TRAZODONE HCL 50 MG PO TABS
25.0000 mg | ORAL_TABLET | Freq: Every day | ORAL | Status: DC
  Administered 2019-03-14 – 2019-03-15 (×2): 25 mg via ORAL
  Filled 2019-03-14 (×2): qty 1

## 2019-03-14 MED ORDER — VANCOMYCIN HCL 10 G IV SOLR
1500.0000 mg | Freq: Two times a day (BID) | INTRAVENOUS | Status: DC
  Filled 2019-03-14: qty 1500

## 2019-03-14 MED ORDER — VANCOMYCIN HCL IN DEXTROSE 1-5 GM/200ML-% IV SOLN: 1000.0000 mg | Freq: Once | INTRAVENOUS | Status: DC

## 2019-03-14 MED ORDER — ASPIRIN EC 81 MG PO TBEC
81.0000 mg | DELAYED_RELEASE_TABLET | Freq: Every day | ORAL | Status: DC
  Administered 2019-03-14 – 2019-03-16 (×3): 81 mg via ORAL
  Filled 2019-03-14 (×3): qty 1

## 2019-03-14 MED ORDER — OXYCODONE HCL 5 MG PO TABS
5.0000 mg | ORAL_TABLET | Freq: Four times a day (QID) | ORAL | Status: DC | PRN
  Administered 2019-03-14: 5 mg via ORAL
  Filled 2019-03-14: qty 1

## 2019-03-14 MED ORDER — ENOXAPARIN SODIUM 30 MG/0.3ML ~~LOC~~ SOLN
30.0000 mg | SUBCUTANEOUS | Status: DC
  Administered 2019-03-14: 30 mg via SUBCUTANEOUS
  Filled 2019-03-14: qty 0.3

## 2019-03-14 MED ORDER — DIPHENHYDRAMINE HCL 50 MG/ML IJ SOLN
50.0000 mg | Freq: Once | INTRAMUSCULAR | Status: AC
  Administered 2019-03-14: 50 mg via INTRAVENOUS
  Filled 2019-03-14: qty 1

## 2019-03-14 MED ORDER — DICLOFENAC SODIUM 75 MG PO TBEC: 75.0000 mg | DELAYED_RELEASE_TABLET | Freq: Two times a day (BID) | ORAL | Status: DC

## 2019-03-14 MED ORDER — ATORVASTATIN CALCIUM 10 MG PO TABS
20.0000 mg | ORAL_TABLET | Freq: Every day | ORAL | Status: DC
  Administered 2019-03-14 – 2019-03-16 (×3): 20 mg via ORAL
  Filled 2019-03-14 (×3): qty 2

## 2019-03-14 NOTE — ED Notes (Signed)
Report given to Alvina Chou and Wyline Copas

## 2019-03-14 NOTE — Progress Notes (Signed)
Pharmacy Antibiotic Note  [name] is a 63 y.o. male admitted on 03/14/2019 with sepsis.  Pharmacy has been consulted for vancomycin/cefepime dosing. Tmax 98.3, WBC 17.9, LA 2.2. Scr 1.2.  Vancomycin 1500mg  IV Q 12hrs. Goal AUC 400-550. Expected AUC: 492 SCr used: 1.2   Plan: Vancomycin 2500 mg IV x1 then 1500 mg IV q12h.  Cefepime 2g IV q8h Monitor clinical status, renal function, cultures, and length of therapy Monitor vancomycin levels as indicated     Temp (24hrs), Avg:98.3 F (36.8 C), Min:98.3 F (36.8 C), Max:98.3 F (36.8 C)  Recent Labs  Lab 03/14/19 1230  WBC 17.9*  CREATININE 1.20  LATICACIDVEN 2.2*    CrCl cannot be calculated (Unknown ideal weight.).    No Known Allergies  Antimicrobials this admission: Vancomycin 5/27 >> Cefepime 5/27 >> Flagyl 5/27 x1  Dose adjustments this admission:   Microbiology results: 5/27 Bcx: 5/27 UCx: 5/27 COVID:  Thank you for allowing pharmacy to be a part of this patient's care.  Marcelino Freestone, PharmD PGY2 Cardiology Pharmacy Resident Please check AMION for all Pharmacist numbers by unit 03/14/2019 2:47 PM

## 2019-03-14 NOTE — ED Notes (Signed)
NURSE Navigator  Spoke to pts daughter , told her pt was going to be admitted and labs concerning for big infection, ptdaughter states her phone is on 24/7

## 2019-03-14 NOTE — ED Triage Notes (Addendum)
Pt arrives with [facility] this morning c/o AMS and hypotension. Pt called EMS this morning after falling around 0830. EMS was called again at 1040; pt was altered, "in and out of consciousness,", and lethargic upon EMS arrival. Pt was a&o x4 despite being lethargic, per EMS.  Per EMS, first SBP was 60, then 80/50, and last BP 76/50. Pt has hx of CHF. Pt given 500 ml NS bolus by EMS.   EMS vitals:  BP 76/50 HR 90 RR 18 94% O2 on RA 96.8 temp CBG 168

## 2019-03-14 NOTE — ED Provider Notes (Addendum)
[facility] EMERGENCY DEPARTMENT Provider Note   CSN: [id number] Arrival date & time: 03/14/19  1120    History   Chief Complaint Chief Complaint  Patient presents with  . hypotension/ AMS    HPI [name] is a 63 y.o. male.     HPI  [name] is a 63 y.o. male, with a history of morbid obesity, HTN, pancreatitis, venous stasis, presenting to the ED with hypotension.  Patient states around 7:30 AM this morning he was sitting on the edge of his bed, began to feel lightheaded, and fell in between the bed and dresser. Fire department arrived and sat patient in a chair.  During their assessment, they noted BP 80/40.  Following the fall, patient notes pain to the right side of the chest, right side of the abdomen, as well as along the midline lumbar, thoracic, and cervical spine.  He states he does have some chronic neck and back pain, but is not sure if the pain he is experiencing now is new.  He was not experiencing pain to the abdomen or chest prior to the fall. He denies anticoagulation. Typically ambulates with a walker.   He denies recent illness, N/V/D, fever/chills, shortness of breath, cough, head injury, numbness, weakness, vision abnormalities, urinary complaints, loss of consciousness, or any other complaints.       Past Medical History:  Diagnosis Date  . Cataracts, bilateral   . Chronic insomnia   . Chronic low back pain   . Gait disorder   . History of headache   . History of melanoma   . Hydrocephalus (HCC)   . Hypertension   . Obesity   . Obstructive sleep apnea on CPAP   . Pancreatitis    Secondary to gallstones  . Pancreatitis due to biliary obstruction   . Peripheral edema    Chronic venous stasis  . Peripheral neuropathy     Patient Active Problem List   Diagnosis Date Noted  . Varicose veins of bilateral lower extremities with other complications 05/23/2017  . Cellulitis of right lower extremity   . Sepsis due to  cellulitis (HCC) 03/21/2017  . Essential hypertension 03/21/2017  . Chronic diastolic CHF (congestive heart failure) (HCC) 03/21/2017  . Renal insufficiency 03/21/2017  . Meralgia paresthetica 10/12/2013  . Polyneuropathy in other diseases classified elsewhere (HCC) 11/22/2012  . Obstructive sleep apnea (adult) (pediatric) 11/22/2012  . Abnormality of gait 11/22/2012  . Headache(784.0) 11/22/2012  . Pain in limb 11/22/2012  . Lumbago 11/22/2012  . Cervicalgia 11/22/2012  . Spinal stenosis in cervical region 11/22/2012  . Degeneration of intervertebral disc, site unspecified 11/22/2012  . Degeneration of cervical intervertebral disc 11/22/2012  . Obstructive hydrocephalus (HCC) 11/22/2012    Past Surgical History:  Procedure Laterality Date  . CARPAL TUNNEL RELEASE Bilateral   . CHOLECYSTECTOMY    . ENDOVENOUS ABLATION SAPHENOUS VEIN W/ LASER Left 06/01/2017   endovenous laser ablation left greater saphenous vein by Josephina Gip MD   . ENDOVENOUS ABLATION SAPHENOUS VEIN W/ LASER Right 06/27/2017   endovenous laser ablation right GSV  by Josephina Gip MD  . INGUINAL HERNIA REPAIR    . LUMBAR SPINE SURGERY    . TONSILLECTOMY          Home Medications    Prior to Admission medications   Medication Sig Start Date End Date Taking? Authorizing Provider  ammonium lactate (AMLACTIN) 12 % cream Apply 1 application topically at bedtime as needed for dry skin.  03/19/14   [provider]  aspirin 81 MG tablet Take 81 mg by mouth daily.    [provider]  atorvastatin (LIPITOR) 20 MG tablet Take 20 mg by mouth daily.  10/13/14   [provider]  diclofenac (VOLTAREN) 75 MG EC tablet TAKE 1 TABLET BY MOUTH 2  TIMES DAILY WITH A MEAL 02/05/16   York Spaniel, MD  doxycycline (VIBRA-TABS) 100 MG tablet Take 1 tablet (100 mg total) by mouth every 12 (twelve) hours. Patient not taking: Reported on 02/23/2019 03/29/17   Regalado, Jon Billings A, MD  furosemide (LASIX) 40  MG tablet Take 40 mg by mouth daily.     [provider]  gabapentin (NEURONTIN) 300 MG capsule Take 1 capsule (300 mg total) by mouth 3 (three) times daily. Patient taking differently: Take 900 mg by mouth 3 (three) times daily.  02/29/16   York Spaniel, MD  hydrocerin (EUCERIN) CREA Apply 1 application topically daily. Patient not taking: Reported on 02/23/2019 03/30/17   Regalado, Jon Billings A, MD  HYDROcodone-acetaminophen (NORCO) 7.5-325 MG tablet TAKE 1 TABLET BY MOUTH EVERY 6 HOURS AS NEEDED FOR MODERATE PAIN (MUST LAST 28 DAYS). 02/27/19   York Spaniel, MD  LORazepam (ATIVAN) 1 MG tablet Take 2 tablets 60 minutes prior to leaving house for office surgery. Patient not taking: Reported on 02/23/2019 06/22/17   Pryor Ochoa, MD  nortriptyline (PAMELOR) 10 MG capsule TAKE 3 CAPSULES (30 MG TOTAL) BY MOUTH EVERY EVENING. 04/11/15   York Spaniel, MD  ramipril (ALTACE) 10 MG capsule Take 10 mg by mouth 2 (two) times daily.    [provider]  ropinirole (REQUIP) 5 MG tablet Take 5 mg by mouth daily.     [provider]  tiZANidine (ZANAFLEX) 4 MG tablet Take 1 tablet by mouth 3  times daily Patient taking differently: Take 4 mg by mouth daily.  04/21/16   York Spaniel, MD  traZODone (DESYREL) 50 MG tablet Take 1 tablet by mouth at bedtime. 03/19/14   [provider]  triamcinolone cream (KENALOG) 0.1 % Apply 1 application topically daily as needed (for rash).     [provider]    Family History Family History  Problem Relation Age of Onset  . Cancer Mother        Liver cancer  . Cancer Father        brain cancer  . Cancer Maternal Grandmother        Liver cancer  . Heart attack Maternal Grandfather     Social History Social History   Tobacco Use  . Smoking status: Never Smoker  . Smokeless tobacco: Never Used  Substance Use Topics  . Alcohol use: Yes    Alcohol/week: 0.0 standard drinks    Comment: Consumes alcohol on occasion   . Drug use: No     Allergies   Patient has no known allergies.   Review of Systems Review of Systems  Constitutional: Negative for chills, diaphoresis and fever.  Respiratory: Negative for cough and shortness of breath.   Cardiovascular: Negative for chest pain.  Gastrointestinal: Positive for abdominal pain. Negative for blood in stool, diarrhea, nausea and vomiting.  Genitourinary: Negative for dysuria and hematuria.  Musculoskeletal: Positive for back pain and neck pain. Negative for arthralgias.       Right-sided chest soreness  Neurological: Positive for light-headedness. Negative for weakness, numbness and headaches.  Psychiatric/Behavioral: Negative for confusion.  All other systems reviewed and are negative.  Physical Exam Updated Vital Signs BP (!) 89/50 (BP Location: Right Arm)   Pulse 85   Temp 98.3 F (36.8 C) (Oral)   Resp (!) 28   SpO2 98%   Physical Exam Vitals signs and nursing note reviewed.  Constitutional:      General: He is not in acute distress.    Appearance: He is well-developed. He is obese. He is not diaphoretic.  HENT:     Head: Normocephalic and atraumatic.     Mouth/Throat:     Mouth: Mucous membranes are dry.     Pharynx: Oropharynx is clear.  Eyes:     Extraocular Movements: Extraocular movements intact.     Conjunctiva/sclera: Conjunctivae normal.     Pupils: Pupils are equal, round, and reactive to light.  Neck:     Musculoskeletal: Neck supple.  Cardiovascular:     Rate and Rhythm: Normal rate and regular rhythm.     Pulses: Normal pulses.          Radial pulses are 2+ on the right side and 2+ on the left side.       Dorsalis pedis pulses are 2+ on the right side and 2+ on the left side.     Heart sounds: Normal heart sounds.     Comments: Tactile temperature in the extremities appropriate and equal bilaterally. Pulmonary:     Effort: Pulmonary effort is normal. No respiratory distress.     Breath sounds: Normal breath  sounds.  Chest:     Chest wall: Tenderness present.    Abdominal:     Palpations: Abdomen is soft.     Tenderness: There is abdominal tenderness. There is no guarding.       Comments: Tenderness along the right side of abdomen without color change.  Musculoskeletal:     Cervical back: He exhibits bony tenderness.     Thoracic back: He exhibits bony tenderness.     Lumbar back: He exhibits bony tenderness.     Comments: Bilateral lower extremity edema, which patient states is not new.   Tenderness along the cervical, thoracic, and lumbar spine without noted deformity, swelling, step-off.  Tenderness along the right side of the chest without swelling, deformity, instability, or color change.  No tenderness through the anterior or left chest.  No tenderness or other abnormality noted to the hips or pelvis. No pain with movement in the upper or lower extremities.  Overall trauma exam performed without any abnormalities noted other than those mentioned.  Lymphadenopathy:     Cervical: No cervical adenopathy.  Skin:    General: Skin is warm and dry.  Neurological:     Mental Status: He is alert and oriented to person, place, and time.     Comments: Patient is readily alert and oriented.  He is able to maintain normal conversational tempo. Sensation to light touch grossly intact in all 4 extremities. Grip strengths equal bilaterally. Handles oral secretions without noted difficulty.  No noted speech deficit. No facial droop. Strength 5/5 in each of the extremities. No arm drift. Cranial nerves III through XII grossly intact. No noted neglect.  Psychiatric:        Mood and Affect: Mood and affect normal.        Speech: Speech normal.        Behavior: Behavior normal.      ED Treatments / Results  Labs (all labs ordered are listed, but only abnormal results are displayed) Labs Reviewed  COMPREHENSIVE METABOLIC PANEL -  Abnormal; Notable for the following components:       Result Value   Calcium 8.3 (*)    Albumin 2.8 (*)    All other components within normal limits  LACTIC ACID, PLASMA - Abnormal; Notable for the following components:   Lactic Acid, Venous 2.2 (*)    All other components within normal limits  CBC WITH DIFFERENTIAL/PLATELET - Abnormal; Notable for the following components:   WBC 17.9 (*)    Neutro Abs 15.0 (*)    Monocytes Absolute 1.3 (*)    Abs Immature Granulocytes 0.20 (*)    All other components within normal limits  URINE CULTURE  CULTURE, BLOOD (ROUTINE X 2)  CULTURE, BLOOD (ROUTINE X 2)  SARS CORONAVIRUS 2 (HOSPITAL ORDER, PERFORMED IN [facility])  ETHANOL  TROPONIN I  LACTIC ACID, PLASMA  URINALYSIS, ROUTINE W REFLEX MICROSCOPIC  RAPID URINE DRUG SCREEN, HOSP PERFORMED    EKG EKG Interpretation  Date/Time:  Wednesday Mar 14 2019 11:40:15 EDT Ventricular Rate:  85 PR Interval:    QRS Duration: 95 QT Interval:  373 QTC Calculation: 444 R Axis:   53 Text Interpretation:  Sinus rhythm Baseline artifact in I, II, III Otherwise no significant change Confirmed by Shaune Pollack [phone]) on 03/14/2019 11:49:11 AM   Radiology Dg Chest Portable 1 View  Result Date: 03/14/2019 CLINICAL DATA:  Right-sided chest pain after fall. EXAM: PORTABLE CHEST 1 VIEW COMPARISON:  Radiographs Feb 23, 2019. FINDINGS: The heart size and mediastinal contours are within normal limits. No pneumothorax or pleural effusion is noted. Mild bibasilar subsegmental atelectasis or infiltrates are noted. The visualized skeletal structures are unremarkable. IMPRESSION: Mild bibasilar subsegmental atelectasis or infiltrates. Electronically Signed   By: Lupita Raider M.D.   On: 03/14/2019 13:04    Procedures .Critical Care Performed by: Anselm Pancoast, PA-C Authorized by: Anselm Pancoast, PA-C   Critical care provider statement:    Critical care time (minutes):  35   Critical care time was exclusive of:  Separately billable procedures and  treating other patients   Critical care was necessary to treat or prevent imminent or life-threatening deterioration of the following conditions:  Sepsis   Critical care was time spent personally by me on the following activities:  Development of treatment plan with patient or surrogate, evaluation of patient's response to treatment, examination of patient, obtaining history from patient or surrogate, ordering and performing treatments and interventions, ordering and review of laboratory studies, ordering and review of radiographic studies, pulse oximetry, re-evaluation of patient's condition and review of old charts   I assumed direction of critical care for this patient from another provider in my specialty: no     (including critical care time)  Medications Ordered in ED Medications  vancomycin (VANCOCIN) 2,500 mg in sodium chloride 0.9 % 500 mL IVPB (2,500 mg Intravenous New Bag/Given 03/14/19 1443)  sodium chloride 0.9 % bolus 1,000 mL (1,000 mLs Intravenous New Bag/Given 03/14/19 1242)  ceFEPIme (MAXIPIME) 2 g in sodium chloride 0.9 % 100 mL IVPB (0 g Intravenous Stopped 03/14/19 1443)  metroNIDAZOLE (FLAGYL) IVPB 500 mg (500 mg Intravenous New Bag/Given 03/14/19 1349)  sodium chloride 0.9 % bolus 1,000 mL (1,000 mLs Intravenous New Bag/Given 03/14/19 1349)     Initial Impression / Assessment and Plan / ED Course  I have reviewed the triage vital signs and the nursing notes.  Pertinent labs & imaging results that were available during my care of the patient were reviewed by me  and considered in my medical decision making (see chart for details).        Patient presents with a complaint of hypotension.  He notes he also sustained a fall.  Based on the patient's description of events, I suspect hypotension may have caused the fall rather than trauma being the cause of patient's hypotension. His hypotension here responded well to IV fluids. He does have mild lactic acidosis of 2.2 as well  as a leukocytosis of 17.9.  Question of infiltrates on chest x-ray, could possibly indicate developing pneumonia.  Code sepsis initiated.  Findings and plan of care discussed with Shaune Pollack, MD. Dr. Erma Heritage personally evaluated and examined this patient.  End of shift patient care handoff report given to Vallery Ridge, EM resident. Plan: CT imaging pending. Admit for continued sepsis work up and management.   Vitals:   03/14/19 1245 03/14/19 1315 03/14/19 1330 03/14/19 1430  BP: (!) 103/59 (!) 103/57 (!) 114/53   Pulse: 88 86 90 89  Resp: 19 19 (!) 30 (!) 33  Temp:      TempSrc:      SpO2: 94% 94% 99% 96%     Final Clinical Impressions(s) / ED Diagnoses   Final diagnoses:  Back pain  Hypotension, unspecified hypotension type  Fall, initial encounter    ED Discharge Orders    None       Concepcion Living 03/14/19 1534    Anselm Pancoast, PA-C 03/14/19 1538    Shaune Pollack, MD 03/14/19 2030

## 2019-03-14 NOTE — H&P (Signed)
Family Medicine Teaching [place] [facility] Admission History and Physical Service Pager: [phone]  Patient name: [name] Medical record number: [id number] Date of birth: 1956-06-18 Age: 63 y.o. Gender: male  Primary Care Provider: Ileana Ladd, MD Consultants: none Code Status: FULL  Chief Complaint: fell today.    Assessment and Plan: [name] is a 63 y.o. male presenting with severe sepsis criteria (quickly improved) . PMH is significant for hydrocephalus, HTN, chronic pain 2/2 back surgeries, lymphedema, insomnia,   Sepsis w/ unknown source-patient presented to emergency department meeting severe sepsis criteria.  Initial respiration rate reported at 21, white blood cell count 17, potentially identified infection sources of cellulitis on legs versus potential but less likely pneumonia given chest x-ray read as infiltrates versus atelectasis (patient denied any respiratory symptoms and was stable on room air).  Initial blood pressure 107/50, resolved to normotension with 2 L bolus normal saline.  Initial lactic acid was 2.2, although quickly resolved to 1.5 in emergency department.  Patient denies known sick contacts, denies fever symptoms, denies shortness of breath or any respiratory symptoms, denies urinary symptoms beyond incontinence which he says has been present for months now (UA was negative).  Initially started on vancomycin (stopped 40 minutes in due to redness, itching, swelling of right arm, no respiratory reaction), cefepime and Flagyl. -Admit to MedSurg, Dr. Gwendolyn Grant attending -Continuous pulse ox -Vitals every 4 -Reduce dose of potentially sedating or blood pressure lowering medications -Add vancomycin to allergy list for potential reaction -Continue cefepime -Start doxycycline (dual coverage for cellulitis and potential CAP) -Continue Flagyl -Consult wound care -Monitor fluid status given patient's crackles on exam versus 2 L bolus for hypotension in the setting of  severe sepsis on admission.  With blood pressure stable, will continue home furosemide -Follow-up and urine culture -Follow-up blood culture -Patient with incomplete medical records, follow-up A1c, TSH, lipids per general risk stratification  Falls (in setting of history of hydrocephalus)-patient follows with [facility] neurology for hydrocephalus.  Presented after fall at home, he fell between her bed and a dresser and a roommate and neighbor had to call EMS for him to be able to get up.  CT work-up negative for acute bony injury or intracranial bleed.  Last documented visit was December 2019, this was for increasing in falls.  Patient states that this is been a continuing issue for him which he expresses is due to lack of strength.  He denies sensation or motor control deficits, denies balance issues, denies any sense of confusion mentally.  Says that his fall triggering this admission was simply from weakness in his legs which is bilateral and intermittent for months now slowly progressing.  Difficult to ascertain on exam if patient's legs were actually weak or if the size of his lymphedema is simply overcoming normal muscular control. -We will consider calling [facility] morning of May 28 to ask if they have further work-up they suggest, note from December 2019 had said follow-up in 1 year  Lymphedema/leg swelling-patient says this problem has been going on for years, he does not feel that his legs are particular change over the last few months.  He says that he is always had the chronic skin changes going on about his legs.  He denies that he feels a new specific pain in that they generally always have some pain.  He says that he spends most of his day sitting with his legs propped up because he feels it helps the swelling.  He is  able to lie flat at night and actually props his legs up above his body as a way to try and help the swelling go down.  Swelling is significantly increased at the end of  the day if he has been standing.  Uses a walker to ambulate -Consult wound care -PT OT evaluation -BMP ordered in case there is a CHF contribution to his leg swelling although patient denies orthopnea.  If significantly positive can order echo, last was done in 2018 showing EF of 55 to 60% -Of note there is shallow ulceration of right second toe, can consider further work-up if osteomyelitis becomes a suspicion  Depression -patient suffered recent loss of his wife of 34 years to cancer.  During CODE STATUS talk he became tearful talking about how much she misses her.  Can evaluate in the morning more fully for potential depression but is likely safe for follow-up outpatient.  Patient is on a TCA currently but says that he takes that for sleep. -Consider outpatient follow-up for discharge summary for depression  Back pain-says this is a chronic thing "it always hurts from my neck down to my tailbone".  On multiple pain medications including Norco and gabapentin, diclofenac.  Significant CT evaluation due to the fact the patient presented after fall with no acute bony injury shown.  Does not feel that his pain is different on admission exam than normal. -There is concern of hypotension given significant pain medication in this patient who was hypotensive on arrival. -Separate Tylenol from home Norco tablets and prescribed scheduled -Lower oxycodone dose from 7.5 every 6 and home Norco dose to 5 mg as needed -Continue home gabapentin -We will monitor for sedating, respiratory suppression, hypotension and lower as needed. -Continue home tizanidine  Insomnia-patient describes insomnia that he takes both trazodone and nortriptyline for sleep.  Given concern for potential reaction to blood pressure as this patient presented septic. -Lower trazodone to 25 from 50 -Lower nortriptyline to 20 from 30 -Can increase as blood pressure/respiratory status allow  Restless leg?  Patient is on her roPurinol.   This the one medication that he seems unable to describe why he is taking it.  He does say that he is legs get a little bit jittery so it is possible that his medications for this.   -We will continue this on medication and conduct some further chart review, we can also discuss with his daughter who he says is a Engineer, civil (consulting) at [employer] to see if she is understanding why he is on this medication  Hematuria: Patient describes during admission exam an episode approximately 4 weeks ago where he said he "peed blood "for a few days.  He did not describe pain to this episode and did not get medical evaluation for during this time and it stopped on its own.  This is not repeated.  There is a small hemoglobin on urine dipstick.  CT abdomen pelvis read with bladder as unremarkable -Given age of patient and new complaint of hematuria, consider urine cytology  FEN/GI: Heart healthy diet Prophylaxis: Lovenox  Disposition: MedSurg for sepsis work-up  History of Present Illness:  [name] is a 63 y.o. male presenting with   He fell at his house early this morning and had trouble getting up. His 'legs gave out'. He did not hit his head. His roommate got his neighbor's help.  The neighbor called the rescue squad who was able to help him up.  He eventually agreed to go to  the hospital.   He uses a walker at baseline at home, because he 'doesn't have the strength'. At times his weakness is worse than other times. He does not feel more confused lately.  He sees Dr. Villa Herb at [facility] for his hydrocephalus.      hHeas had leg swelling and acanthosis since 1999. He props his legs up when he sits down to improve his swelling. Swelling usually decreases overnight.   patient reports urinary urge incontinence for the past two months. No saddle anesthesia.  No changes in urine color, no dysuria. About 4 months ago he 'peed blood' for about 4 days.  He denies cough, fever, chills, chest pain.  His  weight has increased. No orthopnea.  No n/v/d/. No bloold in bowel movements. Has BM every 3-4 days.    He takes all of his medications as prescribed except doxy and requip.   Doesn't smoke cigarettes, drink alcohol, or do drugs. No known allergies, but one of the IV meds did make his arm red earlier.    He lost his wife two years ago. She died of cancer.  They were married 35 years.  He has a daughter and granddaughter. He says he has to 'live for her' in reference to his granddaughter.   Daughter - Senaida Ores - nurse at [employer].  [phone].  [place], son.  We can get his phone number from the daughter.   Review Of Systems: Per HPI with the following additions:   Review of Systems  Constitutional: Negative for chills, fever and weight loss.  Respiratory: Negative for cough.   Cardiovascular: Positive for palpitations and leg swelling. Negative for chest pain and orthopnea.  Gastrointestinal: Negative for abdominal pain, blood in stool, diarrhea and vomiting.  Genitourinary: Positive for urgency. Negative for dysuria and frequency.  Musculoskeletal: Positive for back pain and neck pain.  Skin: Positive for rash.  Neurological: Positive for weakness.  Psychiatric/Behavioral: Positive for depression. Negative for substance abuse.    Patient Active Problem List   Diagnosis Date Noted  . Severe sepsis (HCC) 03/14/2019  . Varicose veins of bilateral lower extremities with other complications 05/23/2017  . Cellulitis of right lower extremity   . Sepsis due to cellulitis (HCC) 03/21/2017  . Essential hypertension 03/21/2017  . Chronic diastolic CHF (congestive heart failure) (HCC) 03/21/2017  . Renal insufficiency 03/21/2017  . Meralgia paresthetica 10/12/2013  . Polyneuropathy in other diseases classified elsewhere (HCC) 11/22/2012  . Obstructive sleep apnea (adult) (pediatric) 11/22/2012  . Abnormality of gait 11/22/2012  . Headache(784.0) 11/22/2012  . Pain in limb 11/22/2012   . Lumbago 11/22/2012  . Cervicalgia 11/22/2012  . Spinal stenosis in cervical region 11/22/2012  . Degeneration of intervertebral disc, site unspecified 11/22/2012  . Degeneration of cervical intervertebral disc 11/22/2012  . Obstructive hydrocephalus (HCC) 11/22/2012    Past Medical History: Past Medical History:  Diagnosis Date  . Cataracts, bilateral   . Chronic insomnia   . Chronic low back pain   . Gait disorder   . History of headache   . History of melanoma   . Hydrocephalus (HCC)   . Hypertension   . Obesity   . Obstructive sleep apnea on CPAP   . Pancreatitis    Secondary to gallstones  . Pancreatitis due to biliary obstruction   . Peripheral edema    Chronic venous stasis  . Peripheral neuropathy     Past Surgical History: Past Surgical History:  Procedure Laterality Date  . CARPAL  TUNNEL RELEASE Bilateral   . CHOLECYSTECTOMY    . ENDOVENOUS ABLATION SAPHENOUS VEIN W/ LASER Left 06/01/2017   endovenous laser ablation left greater saphenous vein by Josephina Gip MD   . ENDOVENOUS ABLATION SAPHENOUS VEIN W/ LASER Right 06/27/2017   endovenous laser ablation right GSV  by Josephina Gip MD  . INGUINAL HERNIA REPAIR    . LUMBAR SPINE SURGERY    . TONSILLECTOMY      Social History: Social History   Tobacco Use  . Smoking status: Never Smoker  . Smokeless tobacco: Never Used  Substance Use Topics  . Alcohol use: Yes    Alcohol/week: 0.0 standard drinks    Comment: Consumes alcohol on occasion  . Drug use: No   Additional social history:  Please also refer to relevant sections of EMR.  Family History: Family History  Problem Relation Age of Onset  . Cancer Mother        Liver cancer  . Cancer Father        brain cancer  . Cancer Maternal Grandmother        Liver cancer  . Heart attack Maternal Grandfather     Allergies and Medications: Allergies  Allergen Reactions  . Vancomycin     Arm began swelling and itching into infusion 5/27, no  respiratory symptoms   No current facility-administered medications on file prior to encounter.    Current Outpatient Medications on File Prior to Encounter  Medication Sig Dispense Refill  . ammonium lactate (AMLACTIN) 12 % cream Apply 1 application topically at bedtime as needed for dry skin.     Marland Kitchen aspirin 81 MG tablet Take 81 mg by mouth daily.    Marland Kitchen atorvastatin (LIPITOR) 20 MG tablet Take 20 mg by mouth daily.     . furosemide (LASIX) 40 MG tablet Take 40 mg by mouth 2 (two) times daily.     Marland Kitchen gabapentin (NEURONTIN) 300 MG capsule Take 1 capsule (300 mg total) by mouth 3 (three) times daily. (Patient taking differently: Take 900 mg by mouth 3 (three) times daily. ) 270 capsule 3  . HYDROcodone-acetaminophen (NORCO) 7.5-325 MG tablet TAKE 1 TABLET BY MOUTH EVERY 6 HOURS AS NEEDED FOR MODERATE PAIN (MUST LAST 28 DAYS). (Patient taking differently: Take 1 tablet by mouth every 6 (six) hours as needed for moderate pain. FOR MODERATE PAIN (MUST LAST 28 DAYS).) 40 tablet 0  . nortriptyline (PAMELOR) 10 MG capsule TAKE 3 CAPSULES (30 MG TOTAL) BY MOUTH EVERY EVENING. (Patient taking differently: Take 30 mg by mouth at bedtime. ) 270 capsule 1  . ramipril (ALTACE) 10 MG capsule Take 10 mg by mouth 2 (two) times daily.    . traZODone (DESYREL) 50 MG tablet Take 1 tablet by mouth at bedtime.    . triamcinolone cream (KENALOG) 0.1 % Apply 1 application topically daily as needed (for rash).     . diclofenac (VOLTAREN) 75 MG EC tablet TAKE 1 TABLET BY MOUTH 2  TIMES DAILY WITH A MEAL (Patient not taking: No sig reported) 180 tablet 1  . doxycycline (VIBRA-TABS) 100 MG tablet Take 1 tablet (100 mg total) by mouth every 12 (twelve) hours. (Patient not taking: Reported on 02/23/2019) 14 tablet 0  . hydrocerin (EUCERIN) CREA Apply 1 application topically daily. (Patient not taking: Reported on 02/23/2019) 113 g 0  . LORazepam (ATIVAN) 1 MG tablet Take 2 tablets 60 minutes prior to leaving house for office surgery.  (Patient not taking: Reported on 02/23/2019) 2 tablet  0  . ropinirole (REQUIP) 5 MG tablet Take 5 mg by mouth daily.     Marland Kitchen. tiZANidine (ZANAFLEX) 4 MG tablet Take 1 tablet by mouth 3  times daily (Patient not taking: Reported on 03/14/2019) 270 tablet 3    Objective: BP (!) 120/55 (BP Location: Left Arm)   Pulse 92   Temp 98.7 F (37.1 C) (Oral)   Resp 20   Wt 135.2 kg Comment: per pt recall, last month  SpO2 100%   BMI 39.32 kg/m  Exam: General: alert and oriented. No acute distress Eyes: PERRL. EOMI.  No scleral icterus  ENTM: moist oral mucosa. No oropharyngeal erythema.   Neck: no thyromegaly.  Cardiovascular: regular rhythm. Normal rate. 2+ radial pulse.  Significant bilateral leg swelling.  Respiratory: bibasilar inspiratory crackles. No wheezes.   Gastrointestinal: soft, nontender.  Normal bowel sounds.  MSK: 5/5 strength bilaterally upper and lower.   Derm: significant ichthyosis and hyperpigmentationand and swelling of the legs.  Ulceration on right second toe. Right leg more erythematous than left.  Neuro: CN 2-12 grossly intact.  Sensation grossly intact.  Psych: pleasant. Becomes very tearful when talking about his wife, who passed away from cancer two years ago.          Labs and Imaging: CBC BMET  Recent Labs  Lab 03/14/19 1230  WBC 17.9*  HGB 13.5  HCT 42.0  PLT 254   Recent Labs  Lab 03/14/19 1230  NA 138  K 4.0  CL 101  CO2 27  BUN 14  CREATININE 1.20  GLUCOSE 96  CALCIUM 8.3*     Lactic Acid 2.2->1.5  Marthenia RollingBland, Bria Sparr, DO 03/14/2019, 9:08 PM PGY-2, [facility] FPTS Intern pager: [phone], text pages welcome

## 2019-03-14 NOTE — ED Provider Notes (Signed)
Medical Decision Making: Care of patient assumed at 3PM.  Agree with history.  See their note for further details.  Briefly, 63 y.o. male with PMH/PSH as below.  Past Medical History:  Diagnosis Date  . Cataracts, bilateral   . Chronic insomnia   . Chronic low back pain   . Gait disorder   . History of headache   . History of melanoma   . Hydrocephalus (HCC)   . Hypertension   . Obesity   . Obstructive sleep apnea on CPAP   . Pancreatitis    Secondary to gallstones  . Pancreatitis due to biliary obstruction   . Peripheral edema    Chronic venous stasis  . Peripheral neuropathy    Past Surgical History:  Procedure Laterality Date  . CARPAL TUNNEL RELEASE Bilateral   . CHOLECYSTECTOMY    . ENDOVENOUS ABLATION SAPHENOUS VEIN W/ LASER Left 06/01/2017   endovenous laser ablation left greater saphenous vein by Josephina Gip MD   . ENDOVENOUS ABLATION SAPHENOUS VEIN W/ LASER Right 06/27/2017   endovenous laser ablation right GSV  by Josephina Gip MD  . INGUINAL HERNIA REPAIR    . LUMBAR SPINE SURGERY    . TONSILLECTOMY        Briefly, patient is a 63 year old male with a history of morbid obesity, HTN, pancreatitis who presents the ED with hypotension.  Patient reportedly fell out of his bed this morning after becoming lightheaded.  Patient's blood pressure with EMS was 80/40.  Lactic acid 2.2, leukocytosis 17.9.  Chest x-ray concerning for bilateral infiltrates.  Current plan is as follows: Follow-up trauma CT imaging and admit.  Blood cultures obtained and antibiotics given.  Patient is received 2 L of fluid with improvement in his blood pressure.  Patient's last EF was 55 to 60%.  Plan:  Follow-up CT imaging to rule out traumatic injury from his fall.  Update: Patient's right arm began itching approximately 40 minutes into his vancomycin infusion.  No other symptoms of anaphylaxis.  Infusion stopped.  CT head shows gross dilation of the lateral and third ventricles with diffuse  effacement of the sulci and periventricular white matter hypodensities.  Fourth ventricle is normal in caliber.  Findings are concerning for hydrocephalus.  No obvious obstructing lesion.  CT of the chest abdomen and pelvis shows mild right posterior basilar atelectasis or pneumonia noted with possible small right pleural effusion.  On reevaluation, patient is resting comfortably.  His blood pressure is within normal limits.  He states that he has a history of hydrocephalus.  He does report that he is had worsening lower extremity weakness over the past 6 months and urinary incontinence for the past 2 months which raises concerns for normal pressure hydrocephalus.  Patient admitted for further management.  I personally reviewed and interpreted all labs.   Radiology Studies:  Images viewed and used in my medical decision making. Formal interpretations by Radiology.    Vallery Ridge, MD 03/14/19 1812    Virgina Norfolk, DO 03/14/19 [id number]

## 2019-03-14 NOTE — ED Notes (Signed)
ED TO INPATIENT HANDOFF REPORT  ED Nurse Name and Phone #: Magnus Ivan [phone]  S Name/Age/Gender [name] 63 y.o. male Room/Bed: 024C/024C  Code Status   Code Status: Prior  Home/SNF/Other Home Patient oriented to: situation Is this baseline? Yes   Triage Complete: Triage complete  Chief Complaint htn  Triage Note Pt arrives with [facility] this morning c/o AMS and hypotension. Pt called EMS this morning after falling around 0830. EMS was called again at 1040; pt was altered, "in and out of consciousness,", and lethargic upon EMS arrival. Pt was a&o x4 despite being lethargic, per EMS.  Per EMS, first SBP was 60, then 80/50, and last BP 76/50. Pt has hx of CHF. Pt given 500 ml NS bolus by EMS.   EMS vitals:  BP 76/50 HR 90 RR 18 94% O2 on RA 96.8 temp CBG 168    Allergies No Known Allergies  Level of Care/Admitting Diagnosis ED Disposition    None      B Medical/Surgery History Past Medical History:  Diagnosis Date  . Cataracts, bilateral   . Chronic insomnia   . Chronic low back pain   . Gait disorder   . History of headache   . History of melanoma   . Hydrocephalus (HCC)   . Hypertension   . Obesity   . Obstructive sleep apnea on CPAP   . Pancreatitis    Secondary to gallstones  . Pancreatitis due to biliary obstruction   . Peripheral edema    Chronic venous stasis  . Peripheral neuropathy    Past Surgical History:  Procedure Laterality Date  . CARPAL TUNNEL RELEASE Bilateral   . CHOLECYSTECTOMY    . ENDOVENOUS ABLATION SAPHENOUS VEIN W/ LASER Left 06/01/2017   endovenous laser ablation left greater saphenous vein by Josephina Gip MD   . ENDOVENOUS ABLATION SAPHENOUS VEIN W/ LASER Right 06/27/2017   endovenous laser ablation right GSV  by Josephina Gip MD  . INGUINAL HERNIA REPAIR    . LUMBAR SPINE SURGERY    . TONSILLECTOMY       A IV Location/Drains/Wounds Patient Lines/Drains/Airways Status   Active Line/Drains/Airways    Name:    Placement date:   Placement time:   Site:   Days:   Peripheral IV 03/14/19 Left Hand   03/14/19    1137    Hand   less than 1   Peripheral IV 03/14/19 Right Hand   03/14/19    1346    Hand   less than 1   External Urinary Catheter   03/14/19    1326    -   less than 1   Wound / Incision (Open or Dehisced) 03/23/17 Other (Comment) Other (Comment) Left oper    03/23/17    1700    Other (Comment)   721   Wound / Incision (Open or Dehisced) 03/26/17 Other (Comment) Toe (Comment  which one) Left 2 cm ulcer on distal 4th toe-md aware-bactroban applied   03/26/17    1553    Toe (Comment  which one)   718          Intake/Output Last 24 hours  Intake/Output Summary (Last 24 hours) at 03/14/2019 1804 Last data filed at 03/14/2019 1524 Gross per 24 hour  Intake 412.5 ml  Output -  Net 412.5 ml    Labs/Imaging Results for orders placed or performed during the hospital encounter of 03/14/19 (from the past 48 hour(s))  Comprehensive metabolic panel  Status: Abnormal   Collection Time: 03/14/19 12:30 PM  Result Value Ref Range   Sodium 138 135 - 145 mmol/L   Potassium 4.0 3.5 - 5.1 mmol/L   Chloride 101 98 - 111 mmol/L   CO2 27 22 - 32 mmol/L   Glucose, Bld 96 70 - 99 mg/dL   BUN 14 8 - 23 mg/dL   Creatinine, Ser 9.14 0.61 - 1.24 mg/dL   Calcium 8.3 (L) 8.9 - 10.3 mg/dL   Total Protein 7.0 6.5 - 8.1 g/dL   Albumin 2.8 (L) 3.5 - 5.0 g/dL   AST 15 15 - 41 U/L   ALT 14 0 - 44 U/L   Alkaline Phosphatase 45 38 - 126 U/L   Total Bilirubin 0.8 0.3 - 1.2 mg/dL   GFR calc non Af Amer >60 >60 mL/min   GFR calc Af Amer >60 >60 mL/min   Anion gap 10 5 - 15    Comment: Performed at [facility], [address] [place], [place] [address]  Ethanol     Status: None   Collection Time: 03/14/19 12:30 PM  Result Value Ref Range   Alcohol, Ethyl (B) <10 <10 mg/dL    Comment: (NOTE) Lowest detectable limit for serum alcohol is 10 mg/dL. For medical purposes only. Performed at [facility], [address], [place], [place] [address]   Lactic acid, plasma     Status: Abnormal   Collection Time: 03/14/19 12:30 PM  Result Value Ref Range   Lactic Acid, Venous 2.2 (HH) 0.5 - 1.9 mmol/L    Comment: CRITICAL RESULT CALLED TO, READ BACK BY AND VERIFIED WITH: J.EASLEY,RN 1317 03/14/2019 CLARK,S Performed at [facility], [address] [place], [place] [address]   Troponin I - Once     Status: None   Collection Time: 03/14/19 12:30 PM  Result Value Ref Range   Troponin I <0.03 <0.03 ng/mL    Comment: Performed at [facility], [address], [place], [place] [address]  CBC with Differential     Status: Abnormal   Collection Time: 03/14/19 12:30 PM  Result Value Ref Range   WBC 17.9 (H) 4.0 - 10.5 K/uL   RBC 4.22 4.22 - 5.81 MIL/uL   Hemoglobin 13.5 13.0 - 17.0 g/dL   HCT 95.2 84.1 - 32.4 %   MCV 99.5 80.0 - 100.0 fL   MCH 32.0 26.0 - 34.0 pg   MCHC 32.1 30.0 - 36.0 g/dL   RDW 40.1 02.7 - 25.3 %   Platelets 254 150 - 400 K/uL   nRBC 0.0 0.0 - 0.2 %   Neutrophils Relative % 84 %   Neutro Abs 15.0 (H) 1.7 - 7.7 K/uL   Lymphocytes Relative 6 %   Lymphs Abs 1.2 0.7 - 4.0 K/uL   Monocytes Relative 8 %   Monocytes Absolute 1.3 (H) 0.1 - 1.0 K/uL   Eosinophils Relative 1 %   Eosinophils Absolute 0.2 0.0 - 0.5 K/uL   Basophils Relative 0 %   Basophils Absolute 0.1 0.0 - 0.1 K/uL   Immature Granulocytes 1 %   Abs Immature Granulocytes 0.20 (H) 0.00 - 0.07 K/uL    Comment: Performed at [facility], [address], [place], [place] [address]  SARS Coronavirus 2 (CEPHEID - Performed in [facility] hospital lab), Hosp Order     Status: None   Collection Time: 03/14/19  1:13 PM  Result Value Ref Range  SARS Coronavirus 2 NEGATIVE NEGATIVE    Comment: (NOTE) If result is NEGATIVE SARS-CoV-2 target nucleic acids are NOT DETECTED. The SARS-CoV-2 RNA is generally detectable in upper and lower  respiratory specimens during the acute phase of  infection. The lowest  concentration of SARS-CoV-2 viral copies this assay can detect is 250  copies / mL. A negative result does not preclude SARS-CoV-2 infection  and should not be used as the sole basis for treatment or other  patient management decisions.  A negative result may occur with  improper specimen collection / handling, submission of specimen other  than nasopharyngeal swab, presence of viral mutation(s) within the  areas targeted by this assay, and inadequate number of viral copies  (<250 copies / mL). A negative result must be combined with clinical  observations, patient history, and epidemiological information. If result is POSITIVE SARS-CoV-2 target nucleic acids are DETECTED. The SARS-CoV-2 RNA is generally detectable in upper and lower  respiratory specimens dur ing the acute phase of infection.  Positive  results are indicative of active infection with SARS-CoV-2.  Clinical  correlation with patient history and other diagnostic information is  necessary to determine patient infection status.  Positive results do  not rule out bacterial infection or co-infection with other viruses. If result is PRESUMPTIVE POSTIVE SARS-CoV-2 nucleic acids MAY BE PRESENT.   A presumptive positive result was obtained on the submitted specimen  and confirmed on repeat testing.  While 2019 novel coronavirus  (SARS-CoV-2) nucleic acids may be present in the submitted sample  additional confirmatory testing may be necessary for epidemiological  and / or clinical management purposes  to differentiate between  SARS-CoV-2 and other Sarbecovirus currently known to infect humans.  If clinically indicated additional testing with an alternate test  methodology [phone]) is advised. The SARS-CoV-2 RNA is generally  detectable in upper and lower respiratory sp ecimens during the acute  phase of infection. The expected result is Negative. Fact Sheet for Patients:   BoilerBrush.com.cy Fact Sheet for Healthcare Providers: https://pope.com/ This test is not yet approved or cleared by the [place] FDA and has been authorized for detection and/or diagnosis of SARS-CoV-2 by FDA under an Emergency Use Authorization (EUA).  This EUA will remain in effect (meaning this test can be used) for the duration of the COVID-19 declaration under Section 564(b)(1) of the Act, 21 U.S.C. section 360bbb-3(b)(1), unless the authorization is terminated or revoked sooner. Performed at [facility], [address] [place], [place] [address]   Lactic acid, plasma     Status: None   Collection Time: 03/14/19  3:20 PM  Result Value Ref Range   Lactic Acid, Venous 1.5 0.5 - 1.9 mmol/L    Comment: Performed at [facility], [address] [place], [place] [address]  Urinalysis, Routine w reflex microscopic     Status: Abnormal   Collection Time: 03/14/19  3:40 PM  Result Value Ref Range   Color, Urine YELLOW YELLOW   APPearance HAZY (A) CLEAR   Specific Gravity, Urine 1.011 1.005 - 1.030   pH 6.0 5.0 - 8.0   Glucose, UA NEGATIVE NEGATIVE mg/dL   Hgb urine dipstick SMALL (A) NEGATIVE   Bilirubin Urine NEGATIVE NEGATIVE   Ketones, ur NEGATIVE NEGATIVE mg/dL   Protein, ur NEGATIVE NEGATIVE mg/dL   Nitrite NEGATIVE NEGATIVE   Leukocytes,Ua NEGATIVE NEGATIVE   RBC / HPF 0-5 0 - 5 RBC/hpf   WBC, UA 6-10 0 - 5 WBC/hpf  Bacteria, UA RARE (A) NONE SEEN   Squamous Epithelial / LPF 0-5 0 - 5   Mucus PRESENT    Hyaline Casts, UA PRESENT    Non Squamous Epithelial 0-5 (A) NONE SEEN    Comment: Performed at [facility], [address], [place], [place] [address]  Urine rapid drug screen (hosp performed)     Status: Abnormal   Collection Time: 03/14/19  3:40 PM  Result Value Ref Range   Opiates POSITIVE (A) NONE DETECTED   Cocaine NONE DETECTED NONE DETECTED   Benzodiazepines NONE DETECTED NONE DETECTED    Amphetamines NONE DETECTED NONE DETECTED   Tetrahydrocannabinol NONE DETECTED NONE DETECTED   Barbiturates NONE DETECTED NONE DETECTED    Comment: (NOTE) DRUG SCREEN FOR MEDICAL PURPOSES ONLY.  IF CONFIRMATION IS NEEDED FOR ANY PURPOSE, NOTIFY LAB WITHIN 5 DAYS. LOWEST DETECTABLE LIMITS FOR URINE DRUG SCREEN Drug Class                     Cutoff (ng/mL) Amphetamine and metabolites    1000 Barbiturate and metabolites    200 Benzodiazepine                 200 Tricyclics and metabolites     300 Opiates and metabolites        300 Cocaine and metabolites        300 THC                            50 Performed at [facility], [address], [place], [place] [address]    Ct Head Wo Contrast  Result Date: 03/14/2019 CLINICAL DATA:  Trauma EXAM: CT HEAD WITHOUT CONTRAST CT CERVICAL SPINE WITHOUT CONTRAST TECHNIQUE: Multidetector CT imaging of the head and cervical spine was performed following the standard protocol without intravenous contrast. Multiplanar CT image reconstructions of the cervical spine were also generated. COMPARISON:  None. FINDINGS: CT HEAD FINDINGS Brain: There is gross dilation of the lateral and third ventricles with diffuse effacement of the sulci and periventricular white matter hypodensity. The fourth ventricle is normal in caliber. Vascular: No hyperdense vessel or unexpected calcification. Skull: Normal. Negative for fracture or focal lesion. Sinuses/Orbits: No acute finding. Other: None. CT CERVICAL SPINE FINDINGS Examination of the cervical spine is performed through the C7 vertebral body. Alignment: Degenerative straightening of the cervical spine. Skull base and vertebrae: No acute fracture. No primary bone lesion or focal pathologic process. Soft tissues and spinal canal: No prevertebral fluid or swelling. No visible canal hematoma. Disc levels: Severe multilevel disc degenerative and osteophytosis. There is a fusion body of C5-C6. Upper chest: Negative.  Other: None. IMPRESSION: 1. There is gross dilation of the lateral and third ventricles with diffuse effacement of the sulci and periventricular white matter hypodensity. The fourth ventricle is normal in caliber. Findings are concerning for hydrocephalus. There is no obvious obstructing lesion of the aqueduct or midbrain on noncontrast CT. 2. No evidence of acute traumatic injury to the brain. 3. Examination of the cervical spine is performed through the C7 vertebral body. There is degenerative straightening of the cervical spine and severe multilevel disc degenerative disease without evidence of fracture or static subluxation. Electronically Signed   By: Lauralyn Primes M.D.   On: 03/14/2019 16:53   Ct Chest W Contrast  Result Date: 03/14/2019 CLINICAL DATA:  Back pain after fall. EXAM: CT CHEST, ABDOMEN, AND PELVIS WITH CONTRAST TECHNIQUE:  Multidetector CT imaging of the chest, abdomen and pelvis was performed following the standard protocol during bolus administration of intravenous contrast. CONTRAST:  OMNIPAQUE IOHEXOL 300 MG/ML  SOLN COMPARISON:  CT scan of May 11, 2018. FINDINGS: CT CHEST FINDINGS Cardiovascular: Atherosclerosis of thoracic aorta is noted without aneurysm or dissection. Coronary artery calcifications are noted. Normal cardiac size. No pericardial effusion. Mediastinum/Nodes: No enlarged mediastinal, hilar, or axillary lymph nodes. Thyroid gland, trachea, and esophagus demonstrate no significant findings. Lungs/Pleura: No pneumothorax is noted. Minimal left basilar subsegmental atelectasis is noted. Right lower lobe opacity is noted concerning for atelectasis or pneumonia. Small right pleural effusion may be present. Musculoskeletal: No chest wall mass or suspicious bone lesions identified. CT ABDOMEN PELVIS FINDINGS Hepatobiliary: Status post cholecystectomy. No biliary dilatation is noted. Mildly nodular hepatic contours are noted. No focal hepatic abnormality is noted. Pancreas:  Unremarkable. No pancreatic ductal dilatation or surrounding inflammatory changes. Spleen: Normal in size without focal abnormality. Adrenals/Urinary Tract: Adrenal glands are unremarkable. Kidneys are normal, without renal calculi, focal lesion, or hydronephrosis. Bladder is unremarkable. Stomach/Bowel: The stomach appears normal. There is no evidence of bowel obstruction or inflammation. The appendix is not clearly visualized. Vascular/Lymphatic: Stable calcified right renal artery aneurysm is noted. No enlarged abdominal or pelvic lymph nodes. Reproductive: Prostate is unremarkable. Other: Small fat containing right inguinal hernia is noted. No ascites is noted. Musculoskeletal: No acute or significant osseous findings. IMPRESSION: No definite evidence of significant traumatic injury seen in the chest, abdomen or pelvis. Mild right posterior basilar atelectasis or pneumonia is noted with possible small right pleural effusion. 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 Small fat containing right inguinal hernia. Coronary artery calcifications are noted. Aortic Atherosclerosis (ICD10-I70.0). Electronically Signed   By: Lupita Raider M.D.   On: 03/14/2019 16:58   Ct Cervical Spine Wo Contrast  Result Date: 03/14/2019 CLINICAL DATA:  Trauma EXAM: CT HEAD WITHOUT CONTRAST CT CERVICAL SPINE WITHOUT CONTRAST TECHNIQUE: Multidetector CT imaging of the head and cervical spine was performed following the standard protocol without intravenous contrast. Multiplanar CT image reconstructions of the cervical spine were also generated. COMPARISON:  None. FINDINGS: CT HEAD FINDINGS Brain: There is gross dilation of the lateral and third ventricles with diffuse effacement of the sulci and periventricular white matter hypodensity. The fourth ventricle is normal in caliber. Vascular: No hyperdense vessel or unexpected calcification. Skull: Normal.  Negative for fracture or focal lesion. Sinuses/Orbits: No acute finding. Other: None. CT CERVICAL SPINE FINDINGS Examination of the cervical spine is performed through the C7 vertebral body. Alignment: Degenerative straightening of the cervical spine. Skull base and vertebrae: No acute fracture. No primary bone lesion or focal pathologic process. Soft tissues and spinal canal: No prevertebral fluid or swelling. No visible canal hematoma. Disc levels: Severe multilevel disc degenerative and osteophytosis. There is a fusion body of C5-C6. Upper chest: Negative. Other: None. IMPRESSION: 1. There is gross dilation of the lateral and third ventricles with diffuse effacement of the sulci and periventricular white matter hypodensity. The fourth ventricle is normal in caliber. Findings are concerning for hydrocephalus. There is no obvious obstructing lesion of the aqueduct or midbrain on noncontrast CT. 2. No evidence of acute traumatic injury to the brain. 3. Examination of the cervical spine is performed through the C7 vertebral body. There is degenerative straightening of the cervical spine and severe multilevel disc degenerative disease without evidence of fracture or static subluxation. Electronically Signed  By: Lauralyn Primes M.D.   On: 03/14/2019 16:53   Ct Abdomen Pelvis W Contrast  Result Date: 03/14/2019 CLINICAL DATA:  Back pain after fall. EXAM: CT CHEST, ABDOMEN, AND PELVIS WITH CONTRAST TECHNIQUE: Multidetector CT imaging of the chest, abdomen and pelvis was performed following the standard protocol during bolus administration of intravenous contrast. CONTRAST:  OMNIPAQUE IOHEXOL 300 MG/ML  SOLN COMPARISON:  CT scan of May 11, 2018. FINDINGS: CT CHEST FINDINGS Cardiovascular: Atherosclerosis of thoracic aorta is noted without aneurysm or dissection. Coronary artery calcifications are noted. Normal cardiac size. No pericardial effusion. Mediastinum/Nodes: No enlarged mediastinal, hilar, or axillary  lymph nodes. Thyroid gland, trachea, and esophagus demonstrate no significant findings. Lungs/Pleura: No pneumothorax is noted. Minimal left basilar subsegmental atelectasis is noted. Right lower lobe opacity is noted concerning for atelectasis or pneumonia. Small right pleural effusion may be present. Musculoskeletal: No chest wall mass or suspicious bone lesions identified. CT ABDOMEN PELVIS FINDINGS Hepatobiliary: Status post cholecystectomy. No biliary dilatation is noted. Mildly nodular hepatic contours are noted. No focal hepatic abnormality is noted. Pancreas: Unremarkable. No pancreatic ductal dilatation or surrounding inflammatory changes. Spleen: Normal in size without focal abnormality. Adrenals/Urinary Tract: Adrenal glands are unremarkable. Kidneys are normal, without renal calculi, focal lesion, or hydronephrosis. Bladder is unremarkable. Stomach/Bowel: The stomach appears normal. There is no evidence of bowel obstruction or inflammation. The appendix is not clearly visualized. Vascular/Lymphatic: Stable calcified right renal artery aneurysm is noted. No enlarged abdominal or pelvic lymph nodes. Reproductive: Prostate is unremarkable. Other: Small fat containing right inguinal hernia is noted. No ascites is noted. Musculoskeletal: No acute or significant osseous findings. IMPRESSION: No definite evidence of significant traumatic injury seen in the chest, abdomen or pelvis. Mild right posterior basilar atelectasis or pneumonia is noted with possible small right pleural effusion. 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 Small fat containing right inguinal hernia. Coronary artery calcifications are noted. Aortic Atherosclerosis (ICD10-I70.0). Electronically Signed   By: Lupita Raider M.D.   On: 03/14/2019 16:58   Ct T-spine No Charge  Result Date: 03/14/2019 CLINICAL DATA:  Larey Seat.  Right-sided chest pain. EXAM: CT Thoracic  and Lumbar spine  Contrast TECHNIQUE: Multiplanar CT images of the thoracic and lumbar spine were reconstructed from contemporary CT of the Chest, Abdomen, and Pelvis CONTRAST:  None or No additional COMPARISON:  CT scan 05/11/2018 FINDINGS: CT THORACIC SPINE FINDINGS Alignment: Mild thoracic scoliosis but normal alignment in the sagittal plane. Vertebrae: Moderate degenerative changes but no acute thoracic spine fracture is identified. Paraspinal and other soft tissues: No significant paraspinal findings. No hematoma or mass. Bibasilar atelectasis and a small right pleural effusion are noted. No definite posterior rib fractures. Disc levels: No obvious large disc protrusions or spinal canal compromise. CT LUMBAR SPINE FINDINGS Segmentation: 5 lumbar type vertebral bodies are noted. Alignment: Normal overall alignment in the sagittal plane. Vertebrae: No acute lumbar spine fracture is identified. There are advanced degenerative changes with disc disease and facet disease. Postoperative changes are also noted with interbody fusion at L4-5 and a left-sided laminectomy. Nearly fused disc space at L2-3 could surgical. Paraspinal and other soft tissues: No significant findings. Disc levels: Osteophytic spurring posteriorly at T12-L1, L1-2, L2-3 and L3-4 with impression on the thecal sac but no significant canal stenosis. IMPRESSION: 1. No acute fractures are identified in the thoracic or lumbar spines. 2. Remote postsurgical changes involving the lumbar spine and advanced degenerative changes. Electronically Signed  By: Rudie MeyerP.  Gallerani M.D.   On: 03/14/2019 16:53   Ct L-spine No Charge  Result Date: 03/14/2019 CLINICAL DATA:  Larey SeatFell.  Right-sided chest pain. EXAM: CT Thoracic and Lumbar spine  Contrast TECHNIQUE: Multiplanar CT images of the thoracic and lumbar spine were reconstructed from contemporary CT of the Chest, Abdomen, and Pelvis CONTRAST:  None or No additional COMPARISON:  CT scan 05/11/2018 FINDINGS: CT  THORACIC SPINE FINDINGS Alignment: Mild thoracic scoliosis but normal alignment in the sagittal plane. Vertebrae: Moderate degenerative changes but no acute thoracic spine fracture is identified. Paraspinal and other soft tissues: No significant paraspinal findings. No hematoma or mass. Bibasilar atelectasis and a small right pleural effusion are noted. No definite posterior rib fractures. Disc levels: No obvious large disc protrusions or spinal canal compromise. CT LUMBAR SPINE FINDINGS Segmentation: 5 lumbar type vertebral bodies are noted. Alignment: Normal overall alignment in the sagittal plane. Vertebrae: No acute lumbar spine fracture is identified. There are advanced degenerative changes with disc disease and facet disease. Postoperative changes are also noted with interbody fusion at L4-5 and a left-sided laminectomy. Nearly fused disc space at L2-3 could surgical. Paraspinal and other soft tissues: No significant findings. Disc levels: Osteophytic spurring posteriorly at T12-L1, L1-2, L2-3 and L3-4 with impression on the thecal sac but no significant canal stenosis. IMPRESSION: 1. No acute fractures are identified in the thoracic or lumbar spines. 2. Remote postsurgical changes involving the lumbar spine and advanced degenerative changes. Electronically Signed   By: Rudie MeyerP.  Gallerani M.D.   On: 03/14/2019 16:53   Dg Chest Portable 1 View  Result Date: 03/14/2019 CLINICAL DATA:  Right-sided chest pain after fall. EXAM: PORTABLE CHEST 1 VIEW COMPARISON:  Radiographs Feb 23, 2019. FINDINGS: The heart size and mediastinal contours are within normal limits. No pneumothorax or pleural effusion is noted. Mild bibasilar subsegmental atelectasis or infiltrates are noted. The visualized skeletal structures are unremarkable. IMPRESSION: Mild bibasilar subsegmental atelectasis or infiltrates. Electronically Signed   By: Lupita RaiderJames  Green Jr M.D.   On: 03/14/2019 13:04    Pending Labs Unresulted Labs (From admission,  onward)    Start     Ordered   03/15/19 0500  Basic metabolic panel  Daily,   R     03/14/19 1506   03/14/19 1534  MRSA PCR Screening  Once,   R     03/14/19 1533   03/14/19 1305  Blood Culture (routine x 2)  BLOOD CULTURE X 2,   STAT    Question:  Patient immune status  Answer:  Normal   03/14/19 1305   03/14/19 1210  Urine culture  ONCE - STAT,   STAT     03/14/19 1210          Vitals/Pain Today's Vitals   03/14/19 1533 03/14/19 1642 03/14/19 1645 03/14/19 1750  BP: 121/74 125/70 130/70 124/68  Pulse: 92  86 86  Resp: 19  19 18   Temp:      TempSrc:      SpO2: 100% 99% 99% 98%  Weight:      PainSc:        Isolation Precautions No active isolations  Medications Medications  ceFEPIme (MAXIPIME) 2 g in sodium chloride 0.9 % 100 mL IVPB (has no administration in time range)  vancomycin (VANCOCIN) 1,500 mg in sodium chloride 0.9 % 500 mL IVPB (has no administration in time range)  sodium chloride 0.9 % bolus 1,000 mL (0 mLs Intravenous Stopped 03/14/19 1442)  ceFEPIme (MAXIPIME) 2 g  in sodium chloride 0.9 % 100 mL IVPB (0 g Intravenous Stopped 03/14/19 1443)  metroNIDAZOLE (FLAGYL) IVPB 500 mg (0 mg Intravenous Stopped 03/14/19 1524)  sodium chloride 0.9 % bolus 1,000 mL (0 mLs Intravenous Stopped 03/14/19 1549)  vancomycin (VANCOCIN) 2,500 mg in sodium chloride 0.9 % 500 mL IVPB (0 mg Intravenous Stopped 03/14/19 1520)  diphenhydrAMINE (BENADRYL) injection 50 mg (50 mg Intravenous Given 03/14/19 1534)  iohexol (OMNIPAQUE) 300 MG/ML solution 100 mL (100 mLs Intravenous Contrast Given 03/14/19 1600)    Mobility walks with person assist Low fall risk   Focused Assessments Pulmonary Assessment Handoff:  Lung sounds:   O2 Device: Room Air        R Recommendations: See Admitting Provider Note  Report given to:   Additional Notes: Call if there's more info needed. Thanks.

## 2019-03-14 NOTE — ED Notes (Signed)
Pt in CT.

## 2019-03-14 NOTE — ED Notes (Signed)
Pt's right arm began itching approximately 40 mins into vancomycin infusion- pt has what appears to be hives from IV site to right elbow. Pt denies any sob or trouble breathing. MD called into room and infusion stopped.

## 2019-03-15 ENCOUNTER — Other Ambulatory Visit: Payer: Self-pay

## 2019-03-15 ENCOUNTER — Inpatient Hospital Stay (HOSPITAL_COMMUNITY): Payer: Medicare Other

## 2019-03-15 ENCOUNTER — Telehealth: Payer: Self-pay | Admitting: Neurology

## 2019-03-15 DIAGNOSIS — A419 Sepsis, unspecified organism: Principal | ICD-10-CM

## 2019-03-15 DIAGNOSIS — I5032 Chronic diastolic (congestive) heart failure: Secondary | ICD-10-CM

## 2019-03-15 DIAGNOSIS — W19XXXA Unspecified fall, initial encounter: Secondary | ICD-10-CM

## 2019-03-15 DIAGNOSIS — R6520 Severe sepsis without septic shock: Secondary | ICD-10-CM

## 2019-03-15 DIAGNOSIS — L03119 Cellulitis of unspecified part of limb: Secondary | ICD-10-CM

## 2019-03-15 LAB — LIPID PANEL
Cholesterol: 90 mg/dL (ref 0–200)
HDL: 25 mg/dL — ABNORMAL LOW (ref >40)
LDL Cholesterol: 56 mg/dL (ref 0–99)
Total CHOL/HDL Ratio: 3.6 RATIO
Triglycerides: 45 mg/dL (ref <150)
VLDL: 9 mg/dL (ref 0–40)

## 2019-03-15 LAB — ECHOCARDIOGRAM COMPLETE
Height: 73 in
Weight: 5351.01 oz

## 2019-03-15 LAB — CBC
HCT: 40 % (ref 39.0–52.0)
Hemoglobin: 13.1 g/dL (ref 13.0–17.0)
MCH: 31.8 pg (ref 26.0–34.0)
MCHC: 32.8 g/dL (ref 30.0–36.0)
MCV: 97.1 fL (ref 80.0–100.0)
Platelets: 264 10*3/uL (ref 150–400)
RBC: 4.12 MIL/uL — ABNORMAL LOW (ref 4.22–5.81)
RDW: 13.5 % (ref 11.5–15.5)
WBC: 10.7 10*3/uL — ABNORMAL HIGH (ref 4.0–10.5)
nRBC: 0 % (ref 0.0–0.2)

## 2019-03-15 LAB — HIV ANTIBODY (ROUTINE TESTING W REFLEX): HIV Screen 4th Generation wRfx: NONREACTIVE

## 2019-03-15 LAB — BASIC METABOLIC PANEL
Anion gap: 12 (ref 5–15)
BUN: 11 mg/dL (ref 8–23)
CO2: 25 mmol/L (ref 22–32)
Calcium: 8.1 mg/dL — ABNORMAL LOW (ref 8.9–10.3)
Chloride: 101 mmol/L (ref 98–111)
Creatinine, Ser: 0.81 mg/dL (ref 0.61–1.24)
GFR calc Af Amer: >60 mL/min (ref >60)
GFR calc non Af Amer: >60 mL/min (ref >60)
Glucose, Bld: 102 mg/dL — ABNORMAL HIGH (ref 70–99)
Potassium: 3.8 mmol/L (ref 3.5–5.1)
Sodium: 138 mmol/L (ref 135–145)

## 2019-03-15 LAB — URINE CULTURE: Culture: <10000 — AB

## 2019-03-15 LAB — MAGNESIUM: Magnesium: 1.8 mg/dL (ref 1.7–2.4)

## 2019-03-15 LAB — BRAIN NATRIURETIC PEPTIDE: B Natriuretic Peptide: 1280.3 pg/mL — ABNORMAL HIGH (ref 0.0–100.0)

## 2019-03-15 LAB — HEMOGLOBIN A1C
Hgb A1c MFr Bld: 5.5 % (ref 4.8–5.6)
Mean Plasma Glucose: 111.15 mg/dL

## 2019-03-15 LAB — TSH: TSH: 2.536 u[IU]/mL (ref 0.350–4.500)

## 2019-03-15 LAB — PHOSPHORUS: Phosphorus: 4 mg/dL (ref 2.5–4.6)

## 2019-03-15 MED ORDER — ENOXAPARIN SODIUM 40 MG/0.4ML ~~LOC~~ SOLN
40.0000 mg | SUBCUTANEOUS | Status: DC
  Administered 2019-03-15: 40 mg via SUBCUTANEOUS
  Filled 2019-03-15: qty 0.4

## 2019-03-15 MED ORDER — HYDROCERIN EX CREA
TOPICAL_CREAM | Freq: Every day | CUTANEOUS | Status: DC
  Administered 2019-03-15 – 2019-03-16 (×2): via TOPICAL
  Filled 2019-03-15: qty 113

## 2019-03-15 MED ORDER — MUPIROCIN CALCIUM 2 % EX CREA
TOPICAL_CREAM | Freq: Two times a day (BID) | CUTANEOUS | Status: DC
  Administered 2019-03-15 – 2019-03-16 (×3): via TOPICAL
  Filled 2019-03-15: qty 15

## 2019-03-15 MED ORDER — MUPIROCIN 2 % EX OINT
TOPICAL_OINTMENT | CUTANEOUS | Status: AC
  Administered 2019-03-15: 14:00:00
  Filled 2019-03-15: qty 22

## 2019-03-15 NOTE — Telephone Encounter (Signed)
I attempted to reach the pt to schedule a face to face f/u with Dr. Anne Hahn. Pt was not available and # would not connect to VM, will try again at a later time.

## 2019-03-15 NOTE — Progress Notes (Signed)
Family Medicine Teaching Service Daily Progress Note Intern Pager: [phone]  Patient name: [name] Medical record number: [id number] Date of birth: 1956-06-24 Age: 63 y.o. Gender: male  Primary Care Provider: Ileana Ladd, MD Consultants:  Code Status: full  Pt Overview and Major Events to Date:  5/27 - admitted 5/28 - narrowed to doxy  Assessment and Plan: [name] is a 63 y.o. male presenting with severe sepsis criteria (quickly improved) . PMH is significant for hydrocephalus, HTN, chronic pain 2/2 back surgeries, lymphedema, insomnia,   Sepsis w/ unknown source- initially meeting sepsis criteria. SBP never decreased below 100. LA 2.2, repeat was normal. Possible PNA seen on CXR, but not convincing. UA showed rare bacteria. MRSA PCR negative. No symptoms of either except for a subacute urge incontinence. bibasilar crackles on lung exam. Cellulitis a potential cause, but he has chronic leg swelling and pain and difficult to distinguish possible cellulitis signs from his chronic skin changes associated with venous stasis/lymphedema.  Initially started on vanc/cefepime/flagyl. D/c'd vanc d/t rash. Risk start labs normal.  - doxy (5/28 - ) cefepime (5/27 - ), flagyl (5/27 - ) -Continuous pulse ox - continue home furosemide -f/u cultures - f/u echo  Falls (in setting of history of hydrocephalus)-patient follows with [facility] neurology for hydrocephalus. Last saw them in December for increasing falls. Says that it is d/t lack of strength.  Legs are both very swollen on exam.  Initially presented d/t fall at home. CT spine/head showing no fractures.  No FND or confusion. Spoke with Dr. Anne Hahn who suggested consulting neurosurgery for shunt.  Nuerosurgery states the hydrocephalus is chronic and patient would not benefit from vp shunt.   Lymphedema/leg swelling-chronic, no acute worsening. Props legs up most of the day.  uses walker to ambulate.  BNP 1200.   -Consult wound care -PT  OT evaluation - f/u echo  Depression -patient suffered recent loss of his wife of 34 years to cancer.  During CODE STATUS talk he became tearful talking about how much she misses her.  Back pain-says this is a chronic thing "it always hurts from my neck down to my tailbone".  On multiple pain medications including Norco and gabapentin, diclofenac.  Significant CT evaluation due to the fact the patient presented after fall with no acute bony injury shown.  Does not feel that his pain is different on admission exam than normal. -There is concern of hypotension given significant pain medication in this patient who was hypotensive on arrival. - tylenol scheduled -Lower oxycodone dose from 7.5 every 6 and home Norco dose to 5 mg as needed -Continue home gabapentin -We will monitor for sedating, respiratory suppression, hypotension and lower as needed. -Continue home tizanidine  Insomnia-patient describes insomnia that he takes both trazodone and nortriptyline for sleep.  Given concern for potential reaction to blood pressure as this patient presented septic. -Lower trazodone to 25 from 50 -Lower nortriptyline to 20 from 30 -Can increase as blood pressure/respiratory status allow  Restless leg  Patient is on her requip.    -continue home med  Hematuria: Patient describes during admission exam an episode approximately 4 weeks ago where he said he "peed blood "for a few days. Also having urge incontinence.  There is a small hemoglobin on urine dipstick.   -f/u urine cytology  FEN/GI: Heart healthy diet Prophylaxis: Lovenox  Disposition: home  Subjective:  The patient is having some abdominal pain which he calls 'rib pain', .  No SOB, cough. No  dysuria symptoms.    Objective: Temp:  [98.3 F (36.8 C)-98.7 F (37.1 C)] 98.3 F (36.8 C) (05/28 0414) Pulse Rate:  [84-97] 97 (05/28 0414) Resp:  [14-33] 14 (05/28 0414) BP: (89-130)/(50-82) 114/69 (05/28 0414) SpO2:  [93 %-100 %] 95 %  (05/28 0414) Weight:  [135.2 kg-151.7 kg] 151.7 kg (05/28 0500) Physical Exam: General: alert and oriented. Laying in bed. No acute distress.  Cardiovascular: Regular rhythm. Normal rate.  No murmurs.  Respiratory: no crackles.  Faint wheezing heard b/l and anteriorly.  Possibly cardiac.   Abdomen: TTP in the LUQ and RUQ.  Normal bowel sounds.  Extremities: ichthyosis, plaques on legs b/l with significant nonpitting edema.  R leg more erythematous than left.    Laboratory: Recent Labs  Lab 03/14/19 1230 03/15/19 0516  WBC 17.9* 10.7*  HGB 13.5 13.1  HCT 42.0 40.0  PLT 254 264   Recent Labs  Lab 03/14/19 1230  NA 138  K 4.0  CL 101  CO2 27  BUN 14  CREATININE 1.20  CALCIUM 8.3*  PROT 7.0  BILITOT 0.8  ALKPHOS 45  ALT 14  AST 15  GLUCOSE 96    [name], Navy Rothschild K, MD 03/15/2019, 6:32 AM PGY-1, [facility] Family Medicine FPTS Intern pager: [phone], text pages welcome

## 2019-03-15 NOTE — Telephone Encounter (Signed)
This patient has gone to the hospital, he has had some issues with falling.  He does have known hydrocephalus, he was seen by Dr. Jeral Fruit previously and VP shunting was not recommended.  If the patient is having worsening balance and gait, we may need to revisit this issue with neurosurgery.  The hydrocephalus appears to be significant with effacement of the cortex.  I will try to get a revisit for this patient sometime in the next several weeks.  We will reevaluate the walking issue at that time.

## 2019-03-15 NOTE — Consult Note (Addendum)
WOC Nurse wound consult note Reason for Consult: Consult requested for BLE. WOC team is working remotely today and is not avaible at this location.  Reviewed progress notes and photos in the EMR.   Wound type: Progress notes indicate, "Lymphedema/leg swelling-patient says this problem has been going on for years, he does not feel that his legs are particular change over the last few months.  He says that he is always had the chronic skin changes going on about his legs.  He denies that he feels a new specific pain in that they generally always have some pain.  He says that he spends most of his day sitting with his legs propped up because he feels it helps the swelling.  He is able to lie flat at night and actually props his legs up above his body as a way to try and help the swelling go down."   Right second toe is noted to have a partial thickness wound with is red and dry.  Dressing procedure/placement/frequency: Topical treatment orders provided for staff nurses to perform daily: Eucerin cream to decrease itching and dry peeling skin.  Bactroban to promote moist healing to right second toe, foam dressing to protect from further injury. Pt does not have an ABI available in the EMR.  If Una boots are desired to decrease the edema, then it is best practice to have this diagnostic procedure performed before ordering application which would be performed by the orthopedic technician.  Please re-consult if further assistance is needed.  Thank-you,  Cammie Mcgee MSN, RN, CWOCN, [place], CNS [phone]

## 2019-03-15 NOTE — Progress Notes (Signed)
Occupational Therapy Evaluation Patient Details Name: [name] MRN: [id number] DOB: 01-27-1956 Today's Date: 03/15/2019    History of Present Illness [name] is a 63 y.o. male presenting with severe sepsis criteria (quickly improved) . PMH is significant for hydrocephalus, HTN, chronic pain 2/2 back surgeries, lymphedema, insomnia.   Clinical Impression   PTA, pt was living at home alone, and was indpendent with ADL and received assistance from his daughter and neighbor for IADL and pt reports he was independent with functional mobility. Pt reports several falls within the past month. Pt currently requires minA for ADL and functional mobility at RW level. Educated pt on energy conservation strategies and importance of activity progression to maximize safety and reduce risk of falls. Due to decline in current level of function, pt would benefit from acute OT to address established goals to facilitate safe D/C to venue listed below. At this time, recommend HHOT follow-up. Will continue to follow acutely.  BP   Supine: 136/63 Sitting: 139/68 Standing: 125/73 Sitting following mobility: 131/64    Follow Up Recommendations  Home health OT    Equipment Recommendations  3 in 1 bedside commode    Recommendations for Other Services       Precautions / Restrictions Precautions Precautions: Fall Restrictions Weight Bearing Restrictions: No      Mobility Bed Mobility Overal bed mobility: Needs Assistance Bed Mobility: Supine to Sit     Supine to sit: Min assist     General bed mobility comments: minA to progress upright to EOB  Transfers Overall transfer level: Needs assistance Equipment used: Rolling walker (2 wheeled) Transfers: Sit to/from Stand Sit to Stand: Min assist;Min guard         General transfer comment: minA for second sit<>stand after ambulating in the hallway    Balance Overall balance assessment: Needs assistance Sitting-balance support: No upper  extremity supported;Feet supported Sitting balance-Leahy Scale: Fair     Standing balance support: Bilateral upper extremity supported;During functional activity Standing balance-Leahy Scale: Poor Standing balance comment: pt reliant on BUE support                           ADL either performed or assessed with clinical judgement   ADL Overall ADL's : Needs assistance/impaired Eating/Feeding: Set up;Sitting   Grooming: Set up;Sitting   Upper Body Bathing: Set up;Sitting   Lower Body Bathing: Minimal assistance;Sit to/from stand Lower Body Bathing Details (indicate cue type and reason): pt unable to reach LE  Upper Body Dressing : Set up;Sitting   Lower Body Dressing: Minimal assistance;Sit to/from stand Lower Body Dressing Details (indicate cue type and reason): pt may benefit from AE Toilet Transfer: RW;Ambulation;Minimal assistance Toilet Transfer Details (indicate cue type and reason): minA for further distances;minguard for short distances Toileting- Clothing Manipulation and Hygiene: Min guard       Functional mobility during ADLs: Min guard;Minimal assistance General ADL Comments: pt fatigues easily;vc for reminders to take frequent rest breaks and for activity tolerance;     Vision Patient Visual Report: No change from baseline       Perception     Praxis      Pertinent Vitals/Pain Pain Assessment: Faces Faces Pain Scale: Hurts a little bit Pain Location: lower back Pain Descriptors / Indicators: Guarding;Discomfort Pain Intervention(s): Limited activity within patient's tolerance;Monitored during session     Hand Dominance Right   Extremity/Trunk Assessment Upper Extremity Assessment Upper Extremity Assessment: Generalized weakness;RUE deficits/detail;LUE deficits/detail  RUE Deficits / Details: Pt reports dropping items in both hand;grip strength 3/5 RUE Sensation: WNL LUE Deficits / Details: Pt reports dropping items in both hand;grip  strength 3/5 LUE Sensation: WNL   Lower Extremity Assessment Lower Extremity Assessment: Defer to PT evaluation(notable BLE edema, no sensation in BLE from knee down;)       Communication Communication Communication: No difficulties   Cognition Arousal/Alertness: Awake/alert Behavior During Therapy: WFL for tasks assessed/performed Overall Cognitive Status: Within Functional Limits for tasks assessed                                 General Comments: pt verbalized good self awareness, stated he "pushes himself to his limit" discussed activity progression and importance of self-awareness, taking frequent rest breaks and limiting himself prior to fatigue, pt verbalized understanding   General Comments       Exercises     Shoulder Instructions      Home Living Family/patient expects to be discharged to:: Private residence Living Arrangements: Alone Available Help at Discharge: Neighbor;Family Type of Home: House Home Access: Stairs to enter [employer] of Steps: 2 Entrance Stairs-Rails: Can reach both Home Layout: One level     Bathroom Shower/Tub: Producer, television/film/video: Standard Bathroom Accessibility: Yes   Home Equipment: Shower seat - built in;Walker - 2 wheels   Additional Comments: pt's duaghter lives close by and is able to assist pt with grocery shopping and other IADL;pt's neighbor lives closeby to assist with caring for pt's 2 dogs      Prior Functioning/Environment Level of Independence: Independent        Comments: hx of several falls over last several months. over 6        OT Problem List: Decreased strength;Decreased activity tolerance;Impaired balance (sitting and/or standing);Decreased knowledge of use of DME or AE;Cardiopulmonary status limiting activity;Impaired sensation;Increased edema      OT Treatment/Interventions: Self-care/ADL training;Energy conservation;DME and/or AE instruction;Therapeutic  activities;Patient/family education;Balance training;Therapeutic exercise    OT Goals(Current goals can be found in the care plan section) Acute Rehab OT Goals Patient Stated Goal: to go home and stay home OT Goal Formulation: With patient Time For Goal Achievement: 03/29/19 Potential to Achieve Goals: Good ADL Goals Pt Will Perform Grooming: Independently;standing;sitting Pt Will Perform Lower Body Dressing: with modified independence;with adaptive equipment;sit to/from stand Pt Will Transfer to Toilet: with modified independence;ambulating Additional ADL Goal #1: Pt will demonstrate 3 energy conservation strategies during ADL.  OT Frequency: Min 2X/week   Barriers to D/C:            Co-evaluation PT/OT/SLP Co-Evaluation/Treatment: Yes Reason for Co-Treatment: For patient/therapist safety;To address functional/ADL transfers   OT goals addressed during session: ADL's and self-care      AM-PAC OT "6 Clicks" Daily Activity     Outcome Measure Help from another person eating meals?: None Help from another person taking care of personal grooming?: A Little Help from another person toileting, which includes using toliet, bedpan, or urinal?: A Little Help from another person bathing (including washing, rinsing, drying)?: A Little Help from another person to put on and taking off regular upper body clothing?: A Little Help from another person to put on and taking off regular lower body clothing?: A Little 6 Click Score: 19   End of Session Equipment Utilized During Treatment: Gait belt;Rolling walker Nurse Communication: Mobility status  Activity Tolerance: Patient tolerated treatment well Patient left:  in chair;with call bell/phone within reach  OT Visit Diagnosis: Unsteadiness on feet (R26.81);Other abnormalities of gait and mobility (R26.89);Repeated falls (R29.6);History of falling (Z91.81);Muscle weakness (generalized) (M62.81)                Time: 1610-96041123-1207 OT Time  Calculation (min): 44 min Charges:  OT General Charges $OT Visit: 1 Visit OT Evaluation $OT Eval Moderate Complexity: 1 Mod OT Treatments $Self Care/Home Management : 8-22 mins  Diona Brownereresa Blaze Nylund OTR/L Acute Rehabilitation Services Office: [phone]   Rebeca Alerteresa J Travelle Mcclimans 03/15/2019, 12:44 PM

## 2019-03-15 NOTE — Progress Notes (Signed)
  Echocardiogram 2D Echocardiogram has been performed.  Celene Skeen 03/15/2019, 3:55 PM

## 2019-03-15 NOTE — Progress Notes (Signed)
Physical Therapy Evaluation Patient Details Name: [name] MRN: [id number] DOB: 1956-06-24 Today's Date: 03/15/2019   History of Present Illness  [name] is a 63 y.o. male presenting with severe sepsis criteria (quickly improved) . PMH is significant for hydrocephalus, HTN, chronic pain 2/2 back surgeries, lymphedema, insomnia.     Clinical Impression  Pt admitted with above diagnosis. Pt currently with functional limitations due to the deficits listed below (see PT Problem List). PTA pt at home with history of falls, uses RW at baseline. Today pt walking with contact guard to supervision mid through session. Discussed patients balance and decreased tolerance and pts need acknowledge appropriate times to rest as his falls are likely result of fatigue and buckling in weak legs/ poor balance from diminished sensation. Pt verbalizes understanding, agreeable to work with HHPT. Will cont to follow and progress safety prior to d/c.  Pt will benefit from skilled PT to increase their independence and safety with mobility to allow discharge to the venue listed below.       Follow Up Recommendations Home health PT;Supervision - Intermittent    Equipment Recommendations  None recommended by PT    Recommendations for Other Services       Precautions / Restrictions Precautions Precautions: Fall Restrictions Weight Bearing Restrictions: No      Mobility  Bed Mobility Overal bed mobility: Needs Assistance Bed Mobility: Supine to Sit     Supine to sit: Min assist     General bed mobility comments: minA to progress upright to EOB  Transfers Overall transfer level: Needs assistance Equipment used: Rolling walker (2 wheeled) Transfers: Sit to/from Stand Sit to Stand: Min assist;Min guard         General transfer comment: minA for second sit<>stand after ambulating in the hallway  Ambulation/Gait Ambulation/Gait assistance: Min guard Gait Distance (Feet): 100 Feet Assistive  device: Rolling walker (2 wheeled) Gait Pattern/deviations: Step-to pattern Gait velocity: decreased   General Gait Details: short step length, wide bos. flexed posture, cues for prox to RW and for safety, noticing when he;s beocming unstable  Stairs            Wheelchair Mobility    Modified Rankin (Stroke Patients Only)       Balance Overall balance assessment: Needs assistance Sitting-balance support: No upper extremity supported;Feet supported Sitting balance-Leahy Scale: Fair     Standing balance support: Bilateral upper extremity supported;During functional activity Standing balance-Leahy Scale: Poor Standing balance comment: pt reliant on BUE support                             Pertinent Vitals/Pain Pain Assessment: Faces Faces Pain Scale: Hurts a little bit Pain Location: lower back Pain Descriptors / Indicators: Guarding;Discomfort Pain Intervention(s): Limited activity within patient's tolerance;Monitored during session    Home Living Family/patient expects to be discharged to:: Private residence Living Arrangements: Alone Available Help at Discharge: Neighbor;Family Type of Home: House Home Access: Stairs to enter Entrance Stairs-Rails: Can reach both Entrance Stairs-Number of Steps: 2 Home Layout: One level Home Equipment: Shower seat - built in;Walker - 2 wheels Additional Comments: pt's duaghter lives close by and is able to assist pt with grocery shopping and other IADL;pt's neighbor lives closeby to assist with caring for pt's 2 dogs    Prior Function Level of Independence: Independent         Comments: hx of several falls over last several months. over 6  Hand Dominance   Dominant Hand: Right    Extremity/Trunk Assessment   Upper Extremity Assessment Upper Extremity Assessment: Generalized weakness RUE Deficits / Details: Pt reports dropping items in both hand;grip strength 3/5 RUE Sensation: WNL LUE Deficits /  Details: Pt reports dropping items in both hand;grip strength 3/5 LUE Sensation: WNL    Lower Extremity Assessment Lower Extremity Assessment: Generalized weakness       Communication   Communication: No difficulties  Cognition Arousal/Alertness: Awake/alert Behavior During Therapy: WFL for tasks assessed/performed Overall Cognitive Status: Within Functional Limits for tasks assessed                                 General Comments: pt verbalized good self awareness, stated he "pushes himself to his limit" discussed activity progression and importance of self-awareness, taking frequent rest breaks and limiting himself prior to fatigue, pt verbalized understanding      General Comments      Exercises     Assessment/Plan    PT Assessment Patient needs continued PT services  PT Problem List Decreased strength       PT Treatment Interventions DME instruction;Gait training;Functional mobility training;Therapeutic activities;Therapeutic exercise;Balance training    PT Goals (Current goals can be found in the Care Plan section)  Acute Rehab PT Goals Patient Stated Goal: to go home and stay home PT Goal Formulation: With patient Time For Goal Achievement: 03/29/19 Potential to Achieve Goals: Good    Frequency Min 3X/week   Barriers to discharge Inaccessible home environment      Co-evaluation   Reason for Co-Treatment: For patient/therapist safety;To address functional/ADL transfers   OT goals addressed during session: ADL's and self-care       AM-PAC PT "6 Clicks" Mobility  Outcome Measure Help needed turning from your back to your side while in a flat bed without using bedrails?: None Help needed moving from lying on your back to sitting on the side of a flat bed without using bedrails?: A Little Help needed moving to and from a bed to a chair (including a wheelchair)?: A Little Help needed standing up from a chair using your arms (e.g., wheelchair  or bedside chair)?: A Little Help needed to walk in hospital room?: A Little Help needed climbing 3-5 steps with a railing? : A Little 6 Click Score: 19    End of Session Equipment Utilized During Treatment: Gait belt Activity Tolerance: Patient tolerated treatment well Patient left: in bed Nurse Communication: Mobility status PT Visit Diagnosis: Unsteadiness on feet (R26.81)    Time: 1122-1207 PT Time Calculation (min) (ACUTE ONLY): 45 min   Charges:   PT Evaluation $PT Eval Moderate Complexity: 1 Mod        Etta GrandchildSean Lynnsey Barbara, PT, DPT Acute Rehabilitation Services Pager: [phone] Office: [phone]    Etta GrandchildSean Jessica Seidman 03/15/2019, 3:32 PM

## 2019-03-15 NOTE — Progress Notes (Signed)
New Admission Note:  Arrival Method: Via stretcher from ED Mental Orientation: Alert & Oriented x4 Telemetry: CCMD verifed Assessment: Completed Skin: Refer to flowsheet IV: Right Hand  Pain: 8/10 Safety Measures: Safety Fall Prevention Plan discussed with patient. Admission: Completed [facility] [place] Orientation: Patient has been orientated to the room, unit and the staff.  Orders have been reviewed and are being implemented. Will continue to monitor the patient. Call light has been placed within reach and bed alarm has been activated.   Aram Candela, RN  Phone Number: [phone]

## 2019-03-16 ENCOUNTER — Encounter (HOSPITAL_COMMUNITY): Payer: Self-pay

## 2019-03-16 DIAGNOSIS — I959 Hypotension, unspecified: Secondary | ICD-10-CM

## 2019-03-16 LAB — CBC
HCT: 39.5 % (ref 39.0–52.0)
Hemoglobin: 13 g/dL (ref 13.0–17.0)
MCH: 31.6 pg (ref 26.0–34.0)
MCHC: 32.9 g/dL (ref 30.0–36.0)
MCV: 95.9 fL (ref 80.0–100.0)
Platelets: 246 10*3/uL (ref 150–400)
RBC: 4.12 MIL/uL — ABNORMAL LOW (ref 4.22–5.81)
RDW: 13.1 % (ref 11.5–15.5)
WBC: 10.2 10*3/uL (ref 4.0–10.5)
nRBC: 0 % (ref 0.0–0.2)

## 2019-03-16 LAB — BASIC METABOLIC PANEL
Anion gap: 10 (ref 5–15)
BUN: 9 mg/dL (ref 8–23)
CO2: 26 mmol/L (ref 22–32)
Calcium: 8.2 mg/dL — ABNORMAL LOW (ref 8.9–10.3)
Chloride: 99 mmol/L (ref 98–111)
Creatinine, Ser: 0.66 mg/dL (ref 0.61–1.24)
GFR calc Af Amer: >60 mL/min (ref >60)
GFR calc non Af Amer: >60 mL/min (ref >60)
Glucose, Bld: 124 mg/dL — ABNORMAL HIGH (ref 70–99)
Potassium: 3.2 mmol/L — ABNORMAL LOW (ref 3.5–5.1)
Sodium: 135 mmol/L (ref 135–145)

## 2019-03-16 MED ORDER — MUPIROCIN CALCIUM 2 % EX CREA: TOPICAL_CREAM | Freq: Two times a day (BID) | CUTANEOUS | 0 refills | Status: AC

## 2019-03-16 MED ORDER — DOXYCYCLINE HYCLATE 100 MG PO TABS: 100.0000 mg | ORAL_TABLET | Freq: Two times a day (BID) | ORAL | 0 refills | Status: DC

## 2019-03-16 MED ORDER — POTASSIUM CHLORIDE CRYS ER 20 MEQ PO TBCR
40.0000 meq | EXTENDED_RELEASE_TABLET | Freq: Two times a day (BID) | ORAL | Status: AC
  Administered 2019-03-16 (×2): 40 meq via ORAL
  Filled 2019-03-16 (×2): qty 2

## 2019-03-16 NOTE — Care Management Important Message (Signed)
-  Important Message  Patient Details  Name: [name] MRN: [id number] Date of Birth: Oct 14, 1956   Medicare Important Message Given:  Yes    Kalina Morabito Stefan Church 03/16/2019, 3:35 PM

## 2019-03-16 NOTE — Progress Notes (Signed)
Family Medicine Teaching Service Daily Progress Note Intern Pager: [phone]  Patient name: [name] Medical record number: [id number] Date of birth: 12-03-1955 Age: 63 y.o. Gender: male  Primary Care Provider: Ileana Ladd, MD Consultants:  Code Status: full  Pt Overview and Major Events to Date:  5/27 - admitted 5/28 - narrowed to doxy  Assessment and Plan: [name] is a 63 y.o. male presenting with severe sepsis criteria (quickly improved) . PMH is significant for hydrocephalus, HTN, chronic pain 2/2 back surgeries, lymphedema, insomnia,   Cellulitis - initially meeting sepsis criteria, resolved quickly. Possible PNA seen on CXR, but not convincing. UA showed rare bacteria. MRSA PCR negative. No symptoms of either except for a subacute urge incontinence. bibasilar crackles on lung exam. Cellulitis a potential cause, but he has chronic leg swelling and pain and difficult to distinguish possible cellulitis signs from his chronic skin changes associated with venous stasis/lymphedema.  Initially started on vanc/cefepime/flagyl. D/c'd vanc d/t rash. Risk start labs normal. Will send pt home with doxy to complete 7d course.  - doxy (5/28 -6/3 ) cefepime (5/27 -5/28 ), flagyl (5/27 - 5/28) -Continuous pulse ox - continue home furosemide -f/u cultures - wound care consulted, appreciate recs - bactroban for toe, eucerin for pruritus.   Falls (in setting of history of hydrocephalus)-patient follows with [facility] neurology for hydrocephalus. Last saw them in December for increasing falls. Says that it is d/t lack of strength.  Legs are both very swollen on exam.  Initially presented d/t fall at home. CT spine/head showing no fractures.  No FND or confusion. No shunt needed per neurosurgery. Patient has significant difficulty with ambulation d/t weakness.   - PT/OT - 3 in 1 bedside commode, hhOT, hhPT  Lymphedema/leg swelling-chronic, no acute worsening. Props legs up most of the day.   uses walker to ambulate.  BNP 1200, echo showing hyperdynamic function w/ impaired relaxation.  .   -Consult wound care -PT/OT evaluation - see above  Depression -patient suffered recent loss of his wife of 34 years to cancer.  During CODE STATUS talk he became tearful talking about how much she misses her.  Back pain-says this is a chronic thing "it always hurts from my neck down to my tailbone".  On multiple pain medications including Norco and gabapentin, diclofenac.  Significant CT evaluation due to the fact the patient presented after fall with no acute bony injury shown.  Does not feel that his pain is different on admission exam than normal. - tylenol scheduled -Lower oxycodone dose from 7.5 every 6 and home Norco dose to 5 mg as needed -Continue home gabapentin -We will monitor for sedating, respiratory suppression, hypotension and lower as needed. -Continue home tizanidine  Insomnia-patient describes insomnia that he takes both trazodone and nortriptyline for sleep.  Given concern for potential reaction to blood pressure as this patient presented septic. -Lower trazodone to 25 from 50 -Lower nortriptyline to 20 from 30 -Can increase as blood pressure/respiratory status allow  Restless leg  Patient is on his requip.    -continue home med  Hematuria: Patient describes during admission exam an episode approximately 4 weeks ago where he said he "peed blood "for a few days. Also having urge incontinence.  There is a small hemoglobin on urine dipstick.   -f/u urine cytology  FEN/GI: Heart healthy diet Prophylaxis: Lovenox  Disposition: home  Subjective:  Pt has no complaints today.  He wants to go home.  He has a lot of  questions about his echo yesterday, was relieved to hear he would not need additional medications d/t it.    Objective: Temp:  [97.3 F (36.3 C)-98.3 F (36.8 C)] 98.2 F (36.8 C) (05/29 0443) Pulse Rate:  [97-102] 102 (05/29 0443) Resp:  [18-19] 18  (05/29 0443) BP: (121-136)/(63-75) 122/63 (05/29 0443) SpO2:  [93 %-98 %] 93 % (05/29 0443) Weight:  [146.3 kg] 146.3 kg (05/29 0500) Physical Exam: General: alert and oriented.sitting in chair.No acute distress.  Cardiovascular: Regular rhythm. Normal rate.  No murmurs.  Respiratory: inspiratory crackles heard in bases.  Abdomen: TTP in the RUQ.  Normal bowel sounds.  Extremities: ichthyosis, plaques on legs b/l with significant nonpitting. edema.  R leg erythema improved.  MSK: patient with significant difficulty rising from chair.     Laboratory: Recent Labs  Lab 03/14/19 1230 03/15/19 0516 03/16/19 0509  WBC 17.9* 10.7* 10.2  HGB 13.5 13.1 13.0  HCT 42.0 40.0 39.5  PLT 254 264 246   Recent Labs  Lab 03/14/19 1230 03/15/19 0516  NA 138 138  K 4.0 3.8  CL 101 101  CO2 27 25  BUN 14 11  CREATININE 1.20 0.81  CALCIUM 8.3* 8.1*  PROT 7.0  --   BILITOT 0.8  --   ALKPHOS 45  --   ALT 14  --   AST 15  --   GLUCOSE 96 102*    Sandre Kittylson, Daniel K, MD 03/16/2019, 6:22 AM PGY-1, [facility] FPTS Intern pager: [phone], text pages welcome

## 2019-03-16 NOTE — Progress Notes (Signed)
[name] to be discharged Home per MD order. Discussed prescriptions and follow up appointments with the patient. Prescriptions given to patient; medication list explained in detail. Patient verbalized understanding.  Skin clean, dry and intact without evidence of skin break down, no evidence of skin tears noted. IV catheter discontinued intact. Site without signs and symptoms of complications. Dressing and pressure applied. Pt denies pain at the site currently. No complaints noted.  Patient free of lines, drains, and wounds.   An After Visit Summary (AVS) was printed and given to the patient. Patient escorted via wheelchair, and discharged home via private auto.  Arvilla Meres, RN

## 2019-03-16 NOTE — TOC Transition Note (Addendum)
Transition of Care [facility]) - CM/SW Discharge Note   Patient Details  Name: [name] MRN: [id number] Date of Birth: May 11, 1956  Transition of Care [facility] CM/SW Contact:  Warren Kinds, RN Phone Number: [phone] 03/16/2019, 1:46 PM    Clinical Narrative:    Spoke with patient at bedside about home health PT. Patient states that he is currently active with [facility]. Confirmed with [name] - liaison for [facility] - that patient is active for PT and OT. [name] that patient will transition home today. Patient lives alone with 2 boston terriers, but has a Network engineer who comes over to stay with him. States that his daughter works here and will take him home this afternoon when she gets off work at 3. No problems with medications. Discussed 3N1 recommendation. Patient states he has a shower chair and doesn't need the 3N1. No other transition of care needs identified at this time.   Final next level of care: Home w Home Health Services Barriers to Discharge: No Barriers Identified   Patient Goals and CMS Choice   CMS Medicare.gov Compare Post Acute Care list provided to:: Patient Choice offered to / list presented to : Patient  Discharge Placement               Home        Discharge Plan and Services   Discharge Planning Services: CM Consult            DME Arranged: 3-N-1(declined 3N1 stating he has shower chair and does not need any additional equipment at this time) DME Agency: NA       HH Arranged: OT, PT HH Agency: [facility] Date HH Agency Contacted: 03/16/19 Time HH Agency Contacted: 1346 Representative spoke with at [facility] Agency: [name]  Social Determinants of Health (SDOH) Interventions     Readmission Risk Interventions No flowsheet data found.

## 2019-03-16 NOTE — Discharge Summary (Signed)
Family Medicine Teaching [facility] Discharge Summary  Patient name: [name] Medical record number: [id number] Date of birth: 30-Nov-1955 Age: 63 y.o. Gender: male Date of Admission: 03/14/2019  Date of Discharge: 03/16/2019 Admitting Physician: Westley Chandler, MD  Primary Care Provider: Ileana Ladd, MD Consultants: none  Indication for Hospitalization: falls, sepsis  Discharge Diagnoses/Problem List:  Cellulitis Falls Hydrocephalus Lymphedema Back pain, Depression Insomnia  Disposition: home  Discharge Condition: improved, stable  Discharge Exam:  BP (!) 146/74 (BP Location: Right Arm)   Pulse 94   Temp 98.4 F (36.9 C) (Oral)   Resp 18   Ht  (1.854 m)   Wt (!) 146.3 kg   SpO2 97%   BMI 42.55 kg/m  General: alert and oriented.sitting in chair.No acute distress.  Cardiovascular: Regular rhythm. Normal rate.  No murmurs.  Respiratory: inspiratory crackles heard in bases.  Abdomen: TTP in the RUQ.  Normal bowel sounds.  Extremities: ichthyosis, plaques on legs b/l with significant nonpitting. edema.  R leg erythema improved.  MSK: patient with significant difficulty rising from chair.    Brief Hospital Course:  The patient was brought to the ED after experiencing a fall from which he could not get up due to weakness.  EMT arrived at his home and brought him to the ED.  In the ED there was concern for possible sepsis given some tachypnea, elevated white count, and BP that were low but not < 100 systolic.  Initial Lactic was 2.2. He did not have respiratory or dysuria symptoms and his UA was negative for LE and nitrites. There was redness of his right leg which could have possibly been cellulitis, but diagnosis was clouded by chronic venous insufficiency and lymphedema of his legs bilaterally.   His tachypnea, BP and LA quickly resolved in the ED with fluids but was admitted overnight for observation.  He was started on vanc/cefepime/flagyl. vanc was stopped d/t  redness and pruritus of the right arm.  Doxycycline was used instead. The next morning his wbc returned to normal.  abx was narrowed to doxycycline PO.  An echo was performed, which showed hyperdynamic LV function with EF > 65% and impaired relaxation. PT/OT saw the patient and recommended home health PT/OT.  The patient remained afebrile and was discharged the following day to complete a 7 day course of doxycycline.    Falls: the patient has known chronic hydrocephalus for which he see neurology.  His outpatient neurologist was called and he recommended consulting neurosurgery for evaluation of vp shunt placement d/t increasing falls.  Neurosurgery stated the patient would not benefit from a VP shunt.  Pt was discharged with instructions to follow up with his neurologist, Dr. Anne Hahn.     Issues for Follow Up:  1. Cellulitis - pt prescribed doxy PO for 7d total.  Assess compliance and improvement in symptoms.   2. Hydrocephalus - no vp shunt or neurosurgical intervention recommended at this time.  Ensure pt has followed up with his o/p neurologist.  3. Falls - likely d/t increased/weight of his lower extremities 2/2 chronic edema.  Also exhibits overall weakness.  HHPT/OT referral made, but patient at high risk for recurrent falls.  Weight loss may help improve symptoms of fatigue/weakness.  4. Depression - pt still very upset about the recent loss of his wife.  On nortriptyline  qhs at home which he says is for sleep. May benefit from counseling/therapy or adjustment of medication.   5. Echo did not show  active heart failure. But did show some impaired relaxation.  Will need monitoring for worsening but no new management/medication changes made 6. Hematuria - hgb present on UA. Pt complained of 4 days of painless gross hematuria 4 months ago.  Urine cytology ordered but not collected during his admission.  Would benefit from further workup for possible malignancy.    Significant Procedures:  TTE  Significant Labs and Imaging:  Recent Labs  Lab 03/14/19 1230 03/15/19 0516 03/16/19 0509  WBC 17.9* 10.7* 10.2  HGB 13.5 13.1 13.0  HCT 42.0 40.0 39.5  PLT 254 264 246   Recent Labs  Lab 03/14/19 1230 03/15/19 0516 03/16/19 0509  NA 138 138 135  K 4.0 3.8 3.2*  CL 101 101 99  CO2 27 25 26   GLUCOSE 96 102* 124*  BUN 14 11 9   CREATININE 1.20 0.81 0.66  CALCIUM 8.3* 8.1* 8.2*  MG  --  1.8  --   PHOS  --  4.0  --   ALKPHOS 45  --   --   AST 15  --   --   ALT 14  --   --   ALBUMIN 2.8*  --   --    1. The left ventricle has hyperdynamic systolic function, with an ejection fraction of >65%. The cavity size was normal. Left ventricular diastolic Doppler parameters are consistent with impaired relaxation.  2. The right ventricle has normal systolic function. The cavity was normal. There is no increase in right ventricular wall thickness.  3. The aortic valve is tricuspid. Mild calcification of the aortic valve.   Results/Tests Pending at Time of Discharge: none  Discharge Medications:  Allergies as of 03/16/2019      Reactions   Vancomycin    Arm began swelling and itching into infusion 5/27, no respiratory symptoms      Medication List    STOP taking these medications   diclofenac 75 MG EC tablet Commonly known as:  VOLTAREN   tiZANidine 4 MG tablet Commonly known as:  ZANAFLEX     TAKE these medications   ammonium lactate 12 % cream Commonly known as:  AMLACTIN Apply 1 application topically at bedtime as needed for dry skin.   aspirin 81 MG tablet Take 81 mg by mouth daily.   atorvastatin 20 MG tablet Commonly known as:  LIPITOR Take 20 mg by mouth daily.   doxycycline 100 MG tablet Commonly known as:  VIBRA-TABS Take 1 tablet (100 mg total) by mouth 2 (two) times daily. What changed:  when to take this   furosemide 40 MG tablet Commonly known as:  LASIX Take 40 mg by mouth 2 (two) times daily.   gabapentin 300 MG capsule Commonly  known as:  NEURONTIN Take 1 capsule (300 mg total) by mouth 3 (three) times daily. What changed:  how much to take   hydrocerin Crea Apply 1 application topically daily.   HYDROcodone-acetaminophen 7.5-325 MG tablet Commonly known as:  NORCO TAKE 1 TABLET BY MOUTH EVERY 6 HOURS AS NEEDED FOR MODERATE PAIN (MUST LAST 28 DAYS). What changed:  See the new instructions.   LORazepam 1 MG tablet Commonly known as:  ATIVAN Take 2 tablets 60 minutes prior to leaving house for office surgery.   mupirocin cream 2 % Commonly known as:  BACTROBAN Apply topically 2 (two) times daily.   nortriptyline 10 MG capsule Commonly known as:  PAMELOR TAKE 3 CAPSULES (30 MG TOTAL) BY MOUTH EVERY EVENING. What changed:  how much to take  how to take this  when to take this  additional instructions   ramipril 10 MG capsule Commonly known as:  ALTACE Take 10 mg by mouth 2 (two) times daily.   ropinirole 5 MG tablet Commonly known as:  REQUIP Take 5 mg by mouth daily.   traZODone 50 MG tablet Commonly known as:  DESYREL Take 1 tablet by mouth at bedtime.   triamcinolone cream 0.1 % Commonly known as:  KENALOG Apply 1 application topically daily as needed (for rash).       Discharge Instructions: Please refer to Patient Instructions section of EMR for full details.  Patient was counseled important signs and symptoms that should prompt return to medical care, changes in medications, dietary instructions, activity restrictions, and follow up appointments.   Follow-Up Appointments: Follow-up Information    Ileana LaddWong, Francis P, MD. Schedule an appointment as soon as possible for a visit.   Specialty:  Family Medicine Why:  please schedule a followup appointment with your primary care doctor within the next two weeks.  Contact information: [address] [place] [address] [phone]        [facility] Follow up.   Why:  will follow up with you for physical and  occupational therapies          Sandre Kittylson, Daniel K, MD 03/17/2019, 12:11 PM PGY-1, [facility]

## 2019-03-16 NOTE — Progress Notes (Signed)
Physical Therapy Treatment Patient Details Name: [name] MRN: [id number] DOB: 06/05/1956 Today's Date: 03/16/2019    History of Present Illness [name] is a 63 y.o. male presenting with severe sepsis criteria (quickly improved) . PMH is significant for hydrocephalus, HTN, chronic pain 2/2 back surgeries, lymphedema, insomnia.    PT Comments    Patient doing well with PT this morning. Able to stand without assistance and use of RW from mild elevated surface. Discussed home setup and he states his bed and chair are same height as demonstrated today. Ambulated further distances today with less fatigue. Pt also states his friend Onalee HuaDavid will staying with him for assistance, cont to rec HHPT.      Follow Up Recommendations  Home health PT;Supervision - Intermittent     Equipment Recommendations  None recommended by PT    Recommendations for Other Services       Precautions / Restrictions Precautions Precautions: Fall Restrictions Weight Bearing Restrictions: No    Mobility  Bed Mobility Overal bed mobility: Needs Assistance Bed Mobility: Supine to Sit     Supine to sit: Min assist     General bed mobility comments: minA to progress upright to EOB  Transfers Overall transfer level: Needs assistance Equipment used: Rolling walker (2 wheeled) Transfers: Sit to/from Stand Sit to Stand: Min assist;Min guard         General transfer comment: minA for second sit<>stand after ambulating in the hallway  Ambulation/Gait Ambulation/Gait assistance: Min guard Gait Distance (Feet): 150 Feet Assistive device: Rolling walker (2 wheeled) Gait Pattern/deviations: Step-to pattern Gait velocity: decreased   General Gait Details: short step length, wide bos. flexed posture, cues for prox to RW and for safety, noticing when he;s beocming unstable   Stairs             Wheelchair Mobility    Modified Rankin (Stroke Patients Only)       Balance Overall balance  assessment: Needs assistance Sitting-balance support: No upper extremity supported;Feet supported Sitting balance-Leahy Scale: Fair     Standing balance support: Bilateral upper extremity supported;During functional activity Standing balance-Leahy Scale: Poor Standing balance comment: pt reliant on BUE support                            Cognition Arousal/Alertness: Awake/alert Behavior During Therapy: WFL for tasks assessed/performed Overall Cognitive Status: Within Functional Limits for tasks assessed                                 General Comments: pt verbalized good self awareness, stated he "pushes himself to his limit" discussed activity progression and importance of self-awareness, taking frequent rest breaks and limiting himself prior to fatigue, pt verbalized understanding      Exercises      General Comments        Pertinent Vitals/Pain Faces Pain Scale: Hurts a little bit Pain Location: lower back Pain Descriptors / Indicators: Guarding;Discomfort    Home Living                      Prior Function            PT Goals (current goals can now be found in the care plan section) Acute Rehab PT Goals Patient Stated Goal: to go home and stay home PT Goal Formulation: With patient Time For Goal Achievement: 03/29/19 Potential  to Achieve Goals: Good Progress towards PT goals: Progressing toward goals    Frequency    Min 3X/week      PT Plan      Co-evaluation              AM-PAC PT "6 Clicks" Mobility   Outcome Measure  Help needed turning from your back to your side while in a flat bed without using bedrails?: None Help needed moving from lying on your back to sitting on the side of a flat bed without using bedrails?: A Little Help needed moving to and from a bed to a chair (including a wheelchair)?: A Little Help needed standing up from a chair using your arms (e.g., wheelchair or bedside chair)?: A  Little Help needed to walk in hospital room?: A Little Help needed climbing 3-5 steps with a railing? : A Little 6 Click Score: 19    End of Session Equipment Utilized During Treatment: Gait belt Activity Tolerance: Patient tolerated treatment well Patient left: in bed Nurse Communication: Mobility status PT Visit Diagnosis: Unsteadiness on feet (R26.81)     Time: 1015-1040 PT Time Calculation (min) (ACUTE ONLY): 25 min  Charges:  $Gait Training: 8-22 mins $Therapeutic Activity: 8-22 mins                     Etta Grandchild, PT, DPT Acute Rehabilitation Services Pager: [phone] Office: [phone]    Etta Grandchild 03/16/2019, 11:01 AM

## 2019-03-16 NOTE — Discharge Instructions (Signed)
Although we can't be sure of your source of infection, it is most likely from cellulitis.  The antibiotic we prescribed should cover the bacteria that is causing it.  Please take your antibiotic twice a day with the last day being June third.    We have referred you for home health physical and occupational therapy.  Someone will call you to schedule these appointments.    I would like you to follow up with your primary care doctor as well as your neurologist.

## 2019-03-19 LAB — CULTURE, BLOOD (ROUTINE X 2)
Culture: NO GROWTH
Culture: NO GROWTH
Special Requests: ADEQUATE

## 2019-03-19 NOTE — Telephone Encounter (Signed)
I reached out to the pt and was able to scheduled his 2 week f/u. Appt has been scheduled for 03/27/19 at 1:30 pm check in time of 1 pm.

## 2019-03-27 ENCOUNTER — Telehealth: Payer: Self-pay | Admitting: Neurology

## 2019-03-27 ENCOUNTER — Ambulatory Visit: Payer: Self-pay | Admitting: Neurology

## 2019-03-27 NOTE — Telephone Encounter (Signed)
This patient did not show for a revisit appointment today. 

## 2019-04-16 ENCOUNTER — Other Ambulatory Visit: Payer: Self-pay | Admitting: Neurology

## 2019-04-17 ENCOUNTER — Other Ambulatory Visit: Payer: Self-pay | Admitting: Neurology

## 2019-05-21 ENCOUNTER — Other Ambulatory Visit: Payer: Self-pay | Admitting: Neurology

## 2019-06-29 ENCOUNTER — Other Ambulatory Visit: Payer: Self-pay | Admitting: Neurology

## 2019-08-14 ENCOUNTER — Other Ambulatory Visit: Payer: Self-pay | Admitting: Neurology

## 2019-08-14 NOTE — Telephone Encounter (Signed)
[place] Database Verified LR: 07-02-2019 Qty: 28 Pending appointment: 08-16-2019

## 2019-08-16 ENCOUNTER — Ambulatory Visit: Payer: Medicare Other | Admitting: Neurology

## 2019-08-16 ENCOUNTER — Encounter: Payer: Self-pay | Admitting: Neurology

## 2019-08-16 ENCOUNTER — Other Ambulatory Visit: Payer: Self-pay

## 2019-08-16 VITALS — BP 122/78 | HR 93 | Temp 98.5°F | Ht 73.0 in | Wt 328.5 lb

## 2019-08-16 DIAGNOSIS — R269 Unspecified abnormalities of gait and mobility: Secondary | ICD-10-CM

## 2019-08-16 DIAGNOSIS — G911 Obstructive hydrocephalus: Secondary | ICD-10-CM

## 2019-08-16 DIAGNOSIS — G63 Polyneuropathy in diseases classified elsewhere: Secondary | ICD-10-CM

## 2019-08-16 NOTE — Progress Notes (Signed)
Reason for visit: Gait disorder, peripheral neuropathy  [name] is an 63 y.o. 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  The patient has hydrocephalus with enlargement of the lateral ventricles and third ventricle with a normal sized fourth ventricle.  The patient was seen by Dr. 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  The patient has done relatively well with his ability to ambulate, he has been walking with a cane.  Within the last 6 to 8 months however, he believes that there has been a change in his ability to ambulate.  He has been falling more, he is having increasing difficulty with balance.  He underwent a CT scan of the brain in May 2020 showing evidence of the hydrocephalus with some effacement of the cortex.  The patient returns the office today for further evaluation.  The patient denies any headaches or dizziness, he denies issues controlling the bowels or the bladder.  He does note some mild memory troubles but this has not been a significant change in his clinical condition.  Past Medical History:  Diagnosis Date  . Cataracts, bilateral   . Chronic insomnia   . Chronic low back pain   . Gait disorder   . History of headache   . History of melanoma   . Hydrocephalus ([place])   . Hypertension   . Obesity   . Obstructive sleep apnea on CPAP   . Pancreatitis    Secondary to gallstones  . Pancreatitis due to biliary obstruction   . Peripheral edema    Chronic venous stasis  . Peripheral neuropathy     Past Surgical History:  Procedure Laterality Date  . CARPAL TUNNEL RELEASE Bilateral   . CHOLECYSTECTOMY    . ENDOVENOUS ABLATION SAPHENOUS VEIN W/ LASER Left 06/01/2017   endovenous laser ablation left greater saphenous vein by Tinnie Gens MD   . ENDOVENOUS  ABLATION SAPHENOUS VEIN W/ LASER Right 06/27/2017   endovenous laser ablation right GSV  by Tinnie Gens MD  . [place]    . LUMBAR SPINE SURGERY    . TONSILLECTOMY      Family History  Problem Relation Age of Onset  . Cancer Mother        Liver cancer  . Cancer Father        brain cancer  . Cancer Maternal Grandmother        Liver cancer  . Heart attack Maternal Grandfather     Social history:  reports that he has never smoked. He has never used smokeless tobacco. He reports current alcohol use. He reports that he does not use drugs.    Allergies  Allergen Reactions  . Vancomycin     Arm began swelling and itching 72min into infusion 5/27, no respiratory symptoms    Medications:  Prior to Admission medications   Medication Sig Start Date End Date Taking? Authorizing Provider  ammonium lactate (AMLACTIN) 12 % cream Apply 1 application topically at bedtime as needed for dry skin.  03/19/14   [provider]  aspirin 81 MG tablet Take 81 mg by mouth daily.    [provider]  atorvastatin (LIPITOR) 20 MG tablet Take 20 mg by mouth daily.  10/13/14   [provider]  doxycycline (VIBRA-TABS)  100 MG tablet Take 1 tablet (100 mg total) by mouth 2 (two) times daily. 03/16/19   Sandre Kitty, MD  furosemide (LASIX) 40 MG tablet Take 40 mg by mouth 2 (two) times daily.     [provider]  gabapentin (NEURONTIN) 300 MG capsule Take 1 capsule (300 mg total) by mouth 3 (three) times daily. Patient taking differently: Take 900 mg by mouth 3 (three) times daily.  02/29/16   York Spaniel, MD  hydrocerin (EUCERIN) CREA Apply 1 application topically daily. Patient not taking: Reported on 02/23/2019 03/30/17   Regalado, Jon Billings A, MD  HYDROcodone-acetaminophen (NORCO) 7.5-325 MG tablet TAKE 1 TABLET BY MOUTH EVERY 6 HOURS AS NEEDED FOR MODERATE PAIN (MUST LAST 28 DAYS). 08/14/19   York Spaniel, MD  LORazepam (ATIVAN) 1 MG tablet Take 2  tablets 60 minutes prior to leaving house for office surgery. Patient not taking: Reported on 02/23/2019 06/22/17   Pryor Ochoa, MD  mupirocin cream (BACTROBAN) 2 % Apply topically 2 (two) times daily. 03/16/19   Sandre Kitty, MD  nortriptyline (PAMELOR) 10 MG capsule TAKE 3 CAPSULES (30 MG TOTAL) BY MOUTH EVERY EVENING. Patient taking differently: Take 30 mg by mouth at bedtime.  04/11/15   York Spaniel, MD  ramipril (ALTACE) 10 MG capsule Take 10 mg by mouth 2 (two) times daily.    [provider]  ropinirole (REQUIP) 5 MG tablet Take 5 mg by mouth daily.     [provider]  tiZANidine (ZANAFLEX) 4 MG tablet TAKE 1 TABLET BY MOUTH 3  TIMES DAILY 04/17/19   York Spaniel, MD  traZODone (DESYREL) 50 MG tablet Take 1 tablet by mouth at bedtime. 03/19/14   [provider]  triamcinolone cream (KENALOG) 0.1 % Apply 1 application topically daily as needed (for rash).     [provider]    ROS:  Out of a complete 14 system review of symptoms, the patient complains only of the following symptoms, and all other reviewed systems are negative.  Walking difficulty Leg swelling  There were no vitals taken for this visit.  Physical Exam  General: The patient is alert and cooperative at the time of the examination.  Skin: 4+ woody edema is seen in the lower extremities.   Neurologic Exam  Mental status: The patient is alert and oriented x 3 at the time of the examination. The patient has apparent normal recent and remote memory, with an apparently normal attention span and concentration ability.   Cranial nerves: Facial symmetry is present. Speech is normal, no aphasia or dysarthria is noted. Extraocular movements are full. Visual fields are full.  Motor: The patient has good strength in all 4 extremities.  Sensory examination: Soft touch sensation is symmetric on the face, arms, and legs.  Coordination: The patient has good finger-nose-finger  and heel-to-shin bilaterally.  Gait and station: The patient has a wide-based gait, he can walk with a cane.  He has significant difficulty arising from a seated position.  Without the cane, his gait is much more unsteady.  Tandem gait was not attempted.  Romberg is unsteady.  Reflexes: Deep tendon reflexes are symmetric, but are depressed.   Assessment/Plan:  1.  Peripheral neuropathy  2.  Hydrocephalus  3.  Gait disturbance  The patient has had a change in his ability to ambulate with worsening balance that has occurred over the last 8 months or so.  From my prior examinations, his walking has worsened.  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  In the past it was felt that this was a compensated hydrocephalus.  The patient will undergo a repeat CT scan of the brain, we will compare to the prior study done in May 2020.  I will follow-up with him in 4 months, we will set him up for physical therapy for gait training.  If the walking continues to significantly deteriorate, I will get a neurosurgical opinion.  Marlan Palau. Keith [name] MD 08/16/2019 3:13 PM  [facility] [address] [place], [place] [id number]  Phone [phone] Fax [phone]

## 2019-08-20 ENCOUNTER — Telehealth: Payer: Self-pay | Admitting: Neurology

## 2019-08-20 NOTE — Telephone Encounter (Signed)
[employer] medicare order sent to [facility]. No auth they will reach out to the patient to schedule.  

## 2019-08-31 ENCOUNTER — Other Ambulatory Visit: Payer: Self-pay

## 2019-08-31 ENCOUNTER — Ambulatory Visit: Payer: Medicare Other | Admitting: Physical Therapy

## 2019-08-31 ENCOUNTER — Ambulatory Visit
Admission: RE | Admit: 2019-08-31 | Discharge: 2019-08-31 | Disposition: A | Discharge status: Home / Self Care | Payer: Medicare Other | Source: Ambulatory Visit | Attending: Neurology | Admitting: Neurology

## 2019-08-31 DIAGNOSIS — G911 Obstructive hydrocephalus: Secondary | ICD-10-CM

## 2019-09-02 ENCOUNTER — Telehealth: Payer: Self-pay | Admitting: Neurology

## 2019-09-02 DIAGNOSIS — G911 Obstructive hydrocephalus: Secondary | ICD-10-CM

## 2019-09-02 NOTE — Telephone Encounter (Signed)
I called the patient.  The hydrocephalus on CT has not changed from 6 months ago, patient claims that he has had urinary frequency over the last 2 to 3 months, we will send him for physical therapy if his walking does not improve in particular if he gets worse, we may send in for a neurosurgical opinion.  He has seen Dr. Sherwood Gambler previously.    CT head 09/01/19:  IMPRESSION: This CT scan of the head without contrast shows the following: 1.   Marked enlargement of the third and lateral ventricles with effacement of the sulci consistent with obstructive hydrocephalus.  There is no change compared to the 03/14/2019 CT scan.  Older CT scans were not available for comparison. 2.   No acute findings.

## 2019-09-03 NOTE — Addendum Note (Signed)
Addended by: Verlin Grills T on: 09/03/2019 08:56 AM   Modules accepted: Orders

## 2019-09-03 NOTE — Telephone Encounter (Signed)
Noted Referral was sent   

## 2019-09-03 NOTE — Telephone Encounter (Addendum)
Pt called in and requested the referral to be sent to the neurosurgeon now. Pt states father died from a brain tumor and would like to go ahead and have the referral placed.  Pt will proceed with PT has well  I have placed the referral.

## 2019-09-05 ENCOUNTER — Telehealth: Payer: Self-pay | Admitting: Neurology

## 2019-09-05 NOTE — Telephone Encounter (Signed)
Pt has called asking for a call from RN to discuss him feeling as if there is a fire like feel in his head, pt said he is shaking all over and his ears feel stopped up.  Pt was asked if he feels like he needs to go to the ED pt said no because he needs to feed his dogs.  Please call

## 2019-09-05 NOTE — Telephone Encounter (Addendum)
I reached out to the pt. Pt reports he is having trouble finding transportation to Dr. Lollie Sails office. I advised the pt to call [facility] [phone].  And see if they could help with transportation.   Pt reports he would like to talk to Dr. Rita Ohara about the sx he is having.

## 2019-09-06 ENCOUNTER — Ambulatory Visit: Payer: Medicare Other | Attending: Neurology | Admitting: Physical Therapy

## 2019-09-06 ENCOUNTER — Encounter: Payer: Self-pay | Admitting: Physical Therapy

## 2019-09-06 ENCOUNTER — Other Ambulatory Visit: Payer: Self-pay

## 2019-09-06 DIAGNOSIS — R296 Repeated falls: Secondary | ICD-10-CM

## 2019-09-06 DIAGNOSIS — R2681 Unsteadiness on feet: Secondary | ICD-10-CM

## 2019-09-06 DIAGNOSIS — R2689 Other abnormalities of gait and mobility: Secondary | ICD-10-CM | POA: Diagnosis present

## 2019-09-06 DIAGNOSIS — M6281 Muscle weakness (generalized): Secondary | ICD-10-CM | POA: Diagnosis present

## 2019-09-06 NOTE — Therapy (Addendum)
[facility] [address] [place], [place], [address] Phone: [phone]   Fax:  [phone]  Physical Therapy Evaluation  Patient Details  Name: [name] MRN: [id number] Date of Birth: November 06, 1955 Referring Provider (PT): Kathrynn Ducking, MD   Encounter Date: 09/06/2019  PT End of Session - 09/06/19 2107    Visit Number  1    Number of Visits  17    Date for PT Re-Evaluation  11/05/19    Authorization Type  [employer] Medicare; $35 copay.  10th visit PN    PT Start Time  1537    PT Stop Time  1628    PT Time Calculation (min)  51 min    Activity Tolerance  Patient tolerated treatment well    Behavior During Therapy  --   verbose      Past Medical History:  Diagnosis Date  . Cataracts, bilateral   . Chronic insomnia   . Chronic low back pain   . Gait disorder   . History of headache   . History of melanoma   . Hydrocephalus ([place])   . Hypertension   . Obesity   . Obstructive sleep apnea on CPAP   . Pancreatitis    Secondary to gallstones  . Pancreatitis due to biliary obstruction   . Peripheral edema    Chronic venous stasis  . Peripheral neuropathy     Past Surgical History:  Procedure Laterality Date  . CARPAL TUNNEL RELEASE Bilateral   . CHOLECYSTECTOMY    . ENDOVENOUS ABLATION SAPHENOUS VEIN W/ LASER Left 06/01/2017   endovenous laser ablation left greater saphenous vein by Tinnie Gens MD   . ENDOVENOUS ABLATION SAPHENOUS VEIN W/ LASER Right 06/27/2017   endovenous laser ablation right GSV  by Tinnie Gens MD  . [place]    . LUMBAR SPINE SURGERY    . TONSILLECTOMY      There were no vitals filed for this visit.   Subjective Assessment - 09/06/19 1546    Subjective  Pt recently had a CT scan and has an appointment with neurosurgery next week to discuss options.  Pt is very concerned that he has a brain tumor and that he might not have long to live.  He reports multiple falls and  is having a harder time with mobility.  Has a wheelchair at home but does not use it; is concerned he will become dependent upon it.  Has a RW but uses cane today.  Has had 6-9 falls in the home.    Pertinent History  peripheral neuropathy, peripheral edema due to chronic venous stasis, pancreatitis due to biliary obstruction, OSA on CPAP, obesity, HTN, hydrocephalus, melanoma, chronic low back pain, lumbar surgery, bilat carpal tunnel release, endovenous ablation saphenous vein with laser, insomnia, sepsis due to LE cellulitis, chronic diastolic CHF, cervicalgia and multiple falls    Diagnostic tests  CT scan - 1. Marked enlargement of the third and lateral ventricles with effacement of the sulci consistent with obstructive hydrocephalus.  There is no change compared to the 03/14/2019 CT scan.  Older CT scans were not available for comparison and 2.   No acute findings    Patient Stated Goals  "take 20 years off my life" - walking without a cane, RW or w/c.    Currently in Pain?  Velta Addison         [facility] PT Assessment - 09/06/19 1602      Assessment   Medical  Diagnosis  Gait abnormality due to hydrocephalus and peripheral neuropathy    Referring Provider (PT)  York Spanielharles K [name], MD    Onset Date/Surgical Date  08/16/19    Next MD Visit  Neurosurgery next week    Prior Therapy  yes - following back surgery      Precautions   Precautions  Fall    Precaution Comments  peripheral neuropathy, peripheral edema due to chronic venous stasis, pancreatitis due to biliary obstruction, OSA on CPAP, obesity, HTN, hydrocephalus, melanoma, chronic low back pain, lumbar surgery, bilat carpal tunnel release, endovenous ablation saphenous vein with laser, insomnia, sepsis due to LE cellulitis, chronic diastolic CHF, cervicalgia and multiple falls      Balance Screen   Has the patient fallen in the past 6 months  Yes    How many times?  6-9      Home Environment   Living Environment  Private residence    Living  Arrangements  Alone    Type of Home  House    Home Access  Stairs to enter    Entrance Stairs-Number of Steps  1 step     Entrance Stairs-Rails  Right;Left    Home Layout  One level    Home Equipment  Walker - 2 wheels;Cane - single point;Wheelchair - manual      Prior Function   Level of Independence  Independent with household mobility with device;Independent with transfers;Requires assistive device for independence;Independent with basic ADLs      Observation/Other Assessments   Focus on Therapeutic Outcomes (FOTO)   Not assessed      Observation/Other Assessments-Edema    Edema  --   significant bilat edema due to venous stasis     Sensation   Light Touch  Impaired Detail    Light Touch Impaired Details  Absent LLE;Impaired RLE    Hot/Cold  Impaired Detail    Hot/Cold Impaired Details  Absent RLE;Absent LLE    Proprioception  Impaired Detail    Proprioception Impaired Details  Absent RLE;Absent LLE      Posture/Postural Control   Posture/Postural Control  Postural limitations    Postural Limitations  Flexed trunk;Anterior pelvic tilt      ROM / Strength   AROM / PROM / Strength  Strength      Strength   Overall Strength  Deficits    Overall Strength Comments  2-/5 hip flexion, 3/5 knee extension and knee flexion, 2/5 ankle DF      Ambulation/Gait   Ambulation/Gait  Yes    Ambulation/Gait Assistance  4: Min assist    Ambulation/Gait Assistance Details  requests to ambulate in gripper socks instead of slide on shoes to help him feel the floor better    Ambulation Distance (Feet)  50 Feet    Assistive device  Straight cane    Gait Pattern  Step-to pattern;Decreased step length - right;Decreased step length - left;Decreased stride length;Decreased hip/knee flexion - right;Decreased hip/knee flexion - left;Decreased dorsiflexion - right;Decreased dorsiflexion - left;Trunk flexed;Poor foot clearance - left;Poor foot clearance - right    Ambulation Surface  Level;Indoor       Standardized Balance Assessment   Standardized Balance Assessment  Timed Up and Go Test;Berg Balance Test      Berg Balance Test   Sit to Stand  Able to stand using hands after several tries    Standing Unsupported  Able to stand 2 minutes with supervision    Sitting with Back Unsupported but Feet Supported on  Floor or Stool  Able to sit safely and securely 2 minutes    Stand to Sit  Sits independently, has uncontrolled descent    Transfers  Needs one person to assist    Standing Unsupported with Eyes Closed  Able to stand 3 seconds    Standing Unsupported with Feet Together  Needs help to attain position but able to stand for 30 seconds with feet together    From Standing, Reach Forward with Outstretched Arm  Loses balance while trying/requires external support    From Standing Position, Pick up Object from Floor  Unable to try/needs assist to keep balance    From Standing Position, Turn to Look Behind Over each Shoulder  Needs supervision when turning    Turn 360 Degrees  Needs assistance while turning    Standing Unsupported, Alternately Place Feet on Step/Stool  Needs assistance to keep from falling or unable to try    Standing Unsupported, One Foot in Front  Needs help to step but can hold 15 seconds    Standing on One Leg  Unable to try or needs assist to prevent fall    Total Score  16    Berg comment:  16/56      Timed Up and Go Test   TUG  Normal TUG    Normal TUG (seconds)  81    TUG Comments  1:21 with cane and min A; mod A  when pivoting due to LOB                Objective measurements completed on examination: See above findings.      [facility] Adult PT Treatment/Exercise - 09/06/19 1602      Therapeutic Activites    Therapeutic Activities  Other Therapeutic Activities    Other Therapeutic Activities  Long discussion with pt regarding his goals for therapy - making goals functional.   Attempted to provide pt with reassurance regarding CT scan findings to  decrease patient catastrophization  - pt has very poor outlook on his overall health, quality of life and ability to improve - pt believes he is "damaged" and stated multiple times that he may not have much time left.  Attempted to focus on other physical challenges pt has overcome in the past but pt stated he had a better "mental state" then.  Discussed with pt if he wished to wait until after his appointment with neurosurgery to begin therapy but pt states he is motivated to come to therapy and would like to work on his balance and gait.               PT Education - 09/06/19 2059    Education Details  see TA; clinical findings, PT POC and goals    Person(s) Educated  Patient    Methods  Explanation    Comprehension  Verbalized understanding       PT Short Term Goals - 09/06/19 2115      PT SHORT TERM GOAL #1   Title  Pt will initiate standing balance, endurance and LE strengthening HEP    Time  4    Period  Weeks    Status  New    Target Date  10/06/19      PT SHORT TERM GOAL #2   Title  Pt will improve TUG to </= 60 seconds with LRAD and supervision    Baseline  1:21 with cane with min-mod A    Time  4    Period  Weeks    Status  New    Target Date  10/06/19      PT SHORT TERM GOAL #3   Title  Pt will improve standing balance as indicated by 4 point improvement in BERG    Baseline  16/56    Time  4    Period  Weeks    Status  New    Target Date  10/06/19      PT SHORT TERM GOAL #4   Title  Pt will negotiate 4 stairs with two rails with supervision and decreased use of UE to pull self up stairs    Time  4    Period  Weeks    Status  New    Target Date  10/06/19      PT SHORT TERM GOAL #5   Title  Pt will ambulate x 115' with LRAD and supervision over indoor, level surfaces to indicate improved endurance    Time  4    Period  Weeks    Status  New    Target Date  10/06/19        PT Long Term Goals - 09/06/19 2121      PT LONG TERM GOAL #1   Title  Pt  will demonstrate independence with final LE strength and balance HEP    Time  8    Period  Weeks    Status  New    Target Date  11/05/19      PT LONG TERM GOAL #2   Title  Pt will demonstrate decreased falls risk as indicated by decrease in TUG to </= 45 seconds with LRAD    Time  8    Period  Weeks    Status  New    Target Date  11/05/19      PT LONG TERM GOAL #3   Title  Pt will improve BERG balance to >/= 24/56    Time  8    Period  Weeks    Status  New    Target Date  11/05/19      PT LONG TERM GOAL #4   Title  Pt will ambulate x 230' over indoor, level surfaces with LRAD, supervision and increased upright trunk posture    Time  8    Period  Weeks    Status  New    Target Date  11/05/19           09/06/19 2109  Plan  Clinical Impression Statement Pt is a 63 year old male referred to Neuro [facility] for evaluation of gait abnormality due to hydrocephalus; recent CT scan showed: 1. Marked enlargement of the third and lateral ventricles with effacement of the sulci consistent with obstructive hydrocephalus.  There is no change compared to the 03/14/2019 CT scan.  Older CT scans were not available for comparison and 2.   No acute findings.  Pt to see neurosurgeon next week regarding CT results.  Pt's PMH is significant for the following: peripheral neuropathy, peripheral edema due to chronic venous stasis, pancreatitis due to biliary obstruction, OSA on CPAP, obesity, HTN, hydrocephalus, melanoma, chronic low back pain, lumbar surgery, bilat carpal tunnel release, endovenous ablation saphenous vein with laser, insomnia, sepsis due to LE cellulitis, chronic diastolic CHF, cervicalgia and multiple falls. The following deficits were noted during pt's exam: significant LE edema, decreased strength and decreased AROM, absent sensation in bilat LE to light touch, temperature and impaired proprioception, impaired standing balance, abnormal gait and  abnormal standing posture.  Pt's TUG and BERG  scores indicate pt is at high risk for falls. Pt would benefit from skilled PT to address these impairments and functional limitations to maximize functional mobility independence and reduce falls risk  Personal Factors and Comorbidities Behavior Pattern;Comorbidity 3+;Fitness;Past/Current Experience;Social Background  Comorbidities peripheral neuropathy, peripheral edema due to chronic venous stasis, pancreatitis due to biliary obstruction, OSA on CPAP, obesity, HTN, hydrocephalus, melanoma, chronic low back pain, lumbar surgery, bilat carpal tunnel release, endovenous ablation saphenous vein with laser, insomnia, sepsis due to LE cellulitis, chronic diastolic CHF, cervicalgia and multiple falls  Examination-Activity Limitations Bend;Locomotion Level;Stairs;Stand;Transfers  Examination-Participation Restrictions Driving  Pt will benefit from skilled therapeutic intervention in order to improve on the following deficits Abnormal gait;Cardiopulmonary status limiting activity;Decreased activity tolerance;Decreased balance;Decreased endurance;Decreased strength;Difficulty walking;Impaired sensation;Postural dysfunction;Pain;Decreased range of motion  Stability/Clinical Decision Making Evolving/Moderate complexity  Clinical Decision Making Moderate  Rehab Potential Fair  PT Frequency 2x / week  PT Duration 8 weeks  PT Treatment/Interventions ADLs/Self Care Home Management;Cryotherapy;Electrical Stimulation;Moist Heat;DME Instruction;Gait training;Stair training;Functional mobility training;Therapeutic activities;Therapeutic exercise;Balance training;Neuromuscular re-education;Patient/family education;Orthotic Fit/Training;Passive range of motion  PT Next Visit Plan How did his appointment with neurosurgery go?  assess stairs.  Initiate balance and strength HEP.  Ask if he has RW or rollator and if he would be willing to use that in the home/community to help with back pain, balance and endurance?  Consulted  and Agree with Plan of Care Patient         Patient will benefit from skilled therapeutic intervention in order to improve the following deficits and impairments:  Abnormal gait, Cardiopulmonary status limiting activity, Decreased activity tolerance, Decreased balance, Decreased endurance, Decreased strength, Difficulty walking, Impaired sensation, Postural dysfunction, Pain, Decreased range of motion  Visit Diagnosis: Other abnormalities of gait and mobility  Repeated falls  Unsteadiness on feet  Muscle weakness (generalized)     Problem List Patient Active Problem List   Diagnosis Date Noted  . Hypotension   . Fall   . Severe sepsis (HCC) 03/14/2019  . Varicose veins of bilateral lower extremities with other complications 05/23/2017  . Cellulitis of lower extremity   . Sepsis due to cellulitis (HCC) 03/21/2017  . Essential hypertension 03/21/2017  . Chronic diastolic CHF (congestive heart failure) (HCC) 03/21/2017  . Renal insufficiency 03/21/2017  . Meralgia paresthetica 10/12/2013  . Polyneuropathy in other diseases classified elsewhere (HCC) 11/22/2012  . Obstructive sleep apnea (adult) (pediatric) 11/22/2012  . Abnormality of gait 11/22/2012  . Headache(784.0) 11/22/2012  . Pain in limb 11/22/2012  . Lumbago 11/22/2012  . Cervicalgia 11/22/2012  . Spinal stenosis in cervical region 11/22/2012  . Degeneration of intervertebral disc, site unspecified 11/22/2012  . Degeneration of cervical intervertebral disc 11/22/2012  . Obstructive hydrocephalus (HCC) 11/22/2012   Dierdre Highman, PT, DPT 09/06/19    9:26 PM    [facility] [address] [place], [place], [address] Phone: [phone]   Fax:  [phone]  Name: MICHAELANTHONY KEMPTON MRN: [id number] Date of Birth: 1956/05/31

## 2019-09-17 ENCOUNTER — Telehealth: Payer: Self-pay | Admitting: Neurology

## 2019-09-17 DIAGNOSIS — G911 Obstructive hydrocephalus: Secondary | ICD-10-CM

## 2019-09-17 NOTE — Telephone Encounter (Signed)
Pt called wanting to know if his referral was sent out to [facility] or [facility] already. Please advise.

## 2019-09-17 NOTE — Telephone Encounter (Signed)
I called pt about wanting another referral sent to [facility] or [facility]. I stated the last referral was sent on 09/03/2019 to Dr.Robert Windsor [facility]. Pt stated he saw Dr Sherwood Gambler last week and he release him back to Dr Jannifer Franklin. He stated Dr. Sherwood Gambler recommend he seek specialist at [facility] or [facility] for his condition. I stated no notes have been sent to the work in nurse discussing this. I advise pt to have Dr. Sherwood Gambler office fax over last office notes discussing so it can be reviewed. PT verbalized understanding. I gave him Dr Rita Ohara office number.

## 2019-09-18 ENCOUNTER — Other Ambulatory Visit: Payer: Self-pay | Admitting: Neurology

## 2019-09-18 NOTE — Telephone Encounter (Signed)
Noted thanks Dana  °

## 2019-09-19 NOTE — Telephone Encounter (Signed)
Patient called to inform that he would like his referral sent to [facility] and to get an update if his OV notes were received. Please follow up.

## 2019-09-19 NOTE — Telephone Encounter (Signed)
Dr. Rita Ohara can no longer help patient with his care. Per Dr. Rita Ohara patient needs a referral to [facility] Neurosurgery . Dr. Jannifer Franklin Sent patient to Dr. Loel Lofty . Patient is ok to wait until Dr. Jannifer Franklin Returns.

## 2019-09-22 NOTE — Telephone Encounter (Signed)
Tried to call the patient, line was busy, I will try to call back later.

## 2019-09-23 NOTE — Addendum Note (Signed)
Addended by: Kathrynn Ducking on: 09/23/2019 05:10 PM   Modules accepted: Orders

## 2019-09-23 NOTE — Telephone Encounter (Signed)
I called the patient. The patient wishes to have a referral to [facility] for another opinion regarding a VP shunt. I will try to get this set up, I'll send him to Dr. Salomon Fick or Associates.  The patient does have an obstructive pattern of hydrocephalus by MRI, he reports a change in his ability to ambulate over the last several months.

## 2019-09-24 ENCOUNTER — Encounter: Payer: Self-pay | Admitting: Physical Therapy

## 2019-09-24 ENCOUNTER — Other Ambulatory Visit: Payer: Self-pay

## 2019-09-24 ENCOUNTER — Ambulatory Visit: Payer: Medicare Other | Attending: Neurology | Admitting: Physical Therapy

## 2019-09-24 DIAGNOSIS — M6281 Muscle weakness (generalized): Secondary | ICD-10-CM | POA: Diagnosis present

## 2019-09-24 DIAGNOSIS — R293 Abnormal posture: Secondary | ICD-10-CM | POA: Diagnosis present

## 2019-09-24 DIAGNOSIS — R2689 Other abnormalities of gait and mobility: Secondary | ICD-10-CM | POA: Diagnosis present

## 2019-09-24 DIAGNOSIS — R296 Repeated falls: Secondary | ICD-10-CM

## 2019-09-24 DIAGNOSIS — R2681 Unsteadiness on feet: Secondary | ICD-10-CM | POA: Diagnosis present

## 2019-09-24 NOTE — Therapy (Signed)
[facility] [address] [place], [place], [address] Phone: [phone]   Fax:  [phone]  Physical Therapy Treatment  Patient Details  Name: [name] MRN: [id number] Date of Birth: 05/23/1956 Referring Provider (PT): York Spanielharles K [name], MD   Encounter Date: 09/24/2019  PT End of Session - 09/24/19 1043    Visit Number  2    Number of Visits  17    Date for PT Re-Evaluation  11/05/19    Authorization Type  [employer] Medicare; $35 copay.  10th visit PN    PT Start Time  0930    PT Stop Time  1015    PT Time Calculation (min)  45 min    Activity Tolerance  Patient tolerated treatment well    Behavior During Therapy  --   verbose      Past Medical History:  Diagnosis Date  . Cataracts, bilateral   . Chronic insomnia   . Chronic low back pain   . Gait disorder   . History of headache   . History of melanoma   . Hydrocephalus (HCC)   . Hypertension   . Obesity   . Obstructive sleep apnea on CPAP   . Pancreatitis    Secondary to gallstones  . Pancreatitis due to biliary obstruction   . Peripheral edema    Chronic venous stasis  . Peripheral neuropathy     Past Surgical History:  Procedure Laterality Date  . CARPAL TUNNEL RELEASE Bilateral   . CHOLECYSTECTOMY    . ENDOVENOUS ABLATION SAPHENOUS VEIN W/ LASER Left 06/01/2017   endovenous laser ablation left greater saphenous vein by Josephina GipJames Lawson MD   . ENDOVENOUS ABLATION SAPHENOUS VEIN W/ LASER Right 06/27/2017   endovenous laser ablation right GSV  by Josephina GipJames Lawson MD  . INGUINAL HERNIA REPAIR    . LUMBAR SPINE SURGERY    . TONSILLECTOMY      There were no vitals filed for this visit.  Subjective Assessment - 09/24/19 0930    Subjective  No falls since PT evaluation. He saw Dr. Newell CoralNudelman who sent him back to Neurologist with [facility]   He spoke with Dr. Anne HahnWillis who [facility] with  Neurosurgeon to consider surgery for Hydrocephalus.    Pertinent History  peripheral neuropathy, peripheral edema due to chronic venous stasis, pancreatitis due to biliary obstruction, OSA on CPAP, obesity, HTN, hydrocephalus, melanoma, chronic low back pain, lumbar surgery, bilat carpal tunnel release, endovenous ablation saphenous vein with laser, insomnia, sepsis due to LE cellulitis, chronic diastolic CHF, cervicalgia and multiple falls    Diagnostic tests  CT scan - 1. Marked enlargement of the third and lateral ventricles with effacement of the sulci consistent with obstructive hydrocephalus.  There is no change compared to the 03/14/2019 CT scan.  Older CT scans were not available for comparison and 2.   No acute findings    Patient Stated Goals  "take 20 years off my life" - walking without a cane, RW or w/c.    Currently in Pain?  Yes    Pain Score  9     Pain Location  Other (Comment)   whole body from neck down, arms & thighs   Pain Orientation  Right;Left    Pain Descriptors / Indicators  Aching;Throbbing    Pain Type  Chronic pain    Pain Onset  More than a month ago    Pain  Frequency  Constant    Aggravating Factors   cold weather    Pain Relieving Factors  heat & medications.    Effect of Pain on Daily Activities  limits cold weather exposure.                      Therapeutic Exercise: Mini Squat with RW support & chair behind for safety 5 reps. PT demo how to perform squats at sink for increased support compared to RW.  Patient needs chair behind for safety in case LEs collapse.          PT Education - 09/24/19 1010    Education Details  fall risk & risk of injury that would impede surgery or mobility.  Elevation with LEs higher than heart supine with PT recommending >/= 20 minutes 2-3x/day and options on couch using armrests and bed using chair turned upside down.    Person(s) Educated  Patient    Methods  Explanation;Demonstration;Verbal cues     Comprehension  Verbalized understanding;Need further instruction       PT Short Term Goals - 09/06/19 2115      PT SHORT TERM GOAL #1   Title  Pt will initiate standing balance, endurance and LE strengthening HEP    Time  4    Period  Weeks    Status  New    Target Date  10/06/19      PT SHORT TERM GOAL #2   Title  Pt will improve TUG to </= 60 seconds with LRAD and supervision    Baseline  1:21 with cane with min-mod A    Time  4    Period  Weeks    Status  New    Target Date  10/06/19      PT SHORT TERM GOAL #3   Title  Pt will improve standing balance as indicated by 4 point improvement in BERG    Baseline  16/56    Time  4    Period  Weeks    Status  New    Target Date  10/06/19      PT SHORT TERM GOAL #4   Title  Pt will negotiate 4 stairs with two rails with supervision and decreased use of UE to pull self up stairs    Time  4    Period  Weeks    Status  New    Target Date  10/06/19      PT SHORT TERM GOAL #5   Title  Pt will ambulate x 115' with LRAD and supervision over indoor, level surfaces to indicate improved endurance    Time  4    Period  Weeks    Status  New    Target Date  10/06/19        PT Long Term Goals - 09/06/19 2121      PT LONG TERM GOAL #1   Title  Pt will demonstrate independence with final LE strength and balance HEP    Time  8    Period  Weeks    Status  New    Target Date  11/05/19      PT LONG TERM GOAL #2   Title  Pt will demonstrate decreased falls risk as indicated by decrease in TUG to </= 45 seconds with LRAD    Time  8    Period  Weeks    Status  New    Target Date  11/05/19  PT LONG TERM GOAL #3   Title  Pt will improve BERG balance to >/= 24/56    Time  8    Period  Weeks    Status  New    Target Date  11/05/19      PT LONG TERM GOAL #4   Title  Pt will ambulate x 230' over indoor, level surfaces with LRAD, supervision and increased upright trunk posture    Time  8    Period  Weeks    Status  New     Target Date  11/05/19            Plan - 09/24/19 1308    Clinical Impression Statement  Today's skilled PT session focused on patient education to improve safety and reduce falls.  Patient is not compliant with recommended use of walker and contiues to use cane in home.  After PT education, he appears more receptive to using walker.  PT also addressed proper elevation to manage severe edema in BLEs that are also impacting ability of muscles to function properly.    Personal Factors and Comorbidities  Behavior Pattern;Comorbidity 3+;Fitness;Past/Current Experience;Social Background    Comorbidities  peripheral neuropathy, peripheral edema due to chronic venous stasis, pancreatitis due to biliary obstruction, OSA on CPAP, obesity, HTN, hydrocephalus, melanoma, chronic low back pain, lumbar surgery, bilat carpal tunnel release, endovenous ablation saphenous vein with laser, insomnia, sepsis due to LE cellulitis, chronic diastolic CHF, cervicalgia and multiple falls    Examination-Activity Limitations  Bend;Locomotion Level;Stairs;Stand;Transfers    Examination-Participation Restrictions  Driving    Stability/Clinical Decision Making  Evolving/Moderate complexity    Rehab Potential  Fair    PT Frequency  2x / week    PT Duration  8 weeks    PT Treatment/Interventions  ADLs/Self Care Home Management;Cryotherapy;Electrical Stimulation;Moist Heat;DME Instruction;Gait training;Stair training;Functional mobility training;Therapeutic activities;Therapeutic exercise;Balance training;Neuromuscular re-education;Patient/family education;Orthotic Fit/Training;Passive range of motion    PT Next Visit Plan  Did he get [facility] at [facility]?  assess stairs.  Initiate balance and strength HEP.  Ask if he has RW or rollator and if he would be willing to use that in the home/community to help with back pain, balance and endurance?    Consulted and Agree with Plan of Care  Patient       Patient  will benefit from skilled therapeutic intervention in order to improve the following deficits and impairments:  Abnormal gait, Cardiopulmonary status limiting activity, Decreased activity tolerance, Decreased balance, Decreased endurance, Decreased strength, Difficulty walking, Impaired sensation, Postural dysfunction, Pain, Decreased range of motion  Visit Diagnosis: Other abnormalities of gait and mobility  Unsteadiness on feet  Abnormal posture  Muscle weakness  Repeated falls     Problem List Patient Active Problem List   Diagnosis Date Noted  . Hypotension   . Fall   . Severe sepsis (HCC) 03/14/2019  . Varicose veins of bilateral lower extremities with other complications 05/23/2017  . Cellulitis of lower extremity   . Sepsis due to cellulitis (HCC) 03/21/2017  . Essential hypertension 03/21/2017  . Chronic diastolic CHF (congestive heart failure) (HCC) 03/21/2017  . Renal insufficiency 03/21/2017  . Meralgia paresthetica 10/12/2013  . Polyneuropathy in other diseases classified elsewhere (HCC) 11/22/2012  . Obstructive sleep apnea (adult) (pediatric) 11/22/2012  . Abnormality of gait 11/22/2012  . Headache(784.0) 11/22/2012  . Pain in limb 11/22/2012  . Lumbago 11/22/2012  . Cervicalgia 11/22/2012  . Spinal stenosis in cervical region 11/22/2012  . Degeneration of intervertebral  disc, site unspecified 11/22/2012  . Degeneration of cervical intervertebral disc 11/22/2012  . Obstructive hydrocephalus ([place]) 11/22/2012    Jamey Reas PT, DPT 09/24/2019, 10:14 PM  [facility] [address] [place], [place], [address] Phone: [phone]   Fax:  [phone]  Name: [name] MRN: [id number] Date of Birth: [birth year]

## 2019-09-26 ENCOUNTER — Ambulatory Visit: Payer: Medicare Other | Admitting: Physical Therapy

## 2019-09-26 ENCOUNTER — Encounter: Payer: Self-pay | Admitting: Physical Therapy

## 2019-09-26 ENCOUNTER — Other Ambulatory Visit: Payer: Self-pay

## 2019-09-26 DIAGNOSIS — R296 Repeated falls: Secondary | ICD-10-CM

## 2019-09-26 DIAGNOSIS — R2689 Other abnormalities of gait and mobility: Secondary | ICD-10-CM | POA: Diagnosis not present

## 2019-09-26 DIAGNOSIS — R2681 Unsteadiness on feet: Secondary | ICD-10-CM

## 2019-09-26 DIAGNOSIS — R293 Abnormal posture: Secondary | ICD-10-CM

## 2019-09-26 DIAGNOSIS — M6281 Muscle weakness (generalized): Secondary | ICD-10-CM

## 2019-09-26 NOTE — Therapy (Signed)
[facility] [address] [place], [place], [address] Phone: [phone]   Fax:  [phone]  Physical Therapy Treatment  Patient Details  Name: [name] MRN: [id number] Date of Birth: 10/03/1956 Referring Provider (PT): York Spanielharles K [name], MD   Encounter Date: 09/26/2019  PT End of Session - 09/26/19 1509    Visit Number  3    Number of Visits  17    Date for PT Re-Evaluation  11/05/19    Authorization Type  [employer] Medicare; $35 copay.  10th visit PN    PT Start Time  1232    PT Stop Time  1320    PT Time Calculation (min)  48 min    Equipment Utilized During Treatment  Gait belt    Activity Tolerance  Patient tolerated treatment well    Behavior During Therapy  WFL for tasks assessed/performed   verbose, needs frequent re-direction      Past Medical History:  Diagnosis Date  . Cataracts, bilateral   . Chronic insomnia   . Chronic low back pain   . Gait disorder   . History of headache   . History of melanoma   . Hydrocephalus (HCC)   . Hypertension   . Obesity   . Obstructive sleep apnea on CPAP   . Pancreatitis    Secondary to gallstones  . Pancreatitis due to biliary obstruction   . Peripheral edema    Chronic venous stasis  . Peripheral neuropathy     Past Surgical History:  Procedure Laterality Date  . CARPAL TUNNEL RELEASE Bilateral   . CHOLECYSTECTOMY    . ENDOVENOUS ABLATION SAPHENOUS VEIN W/ LASER Left 06/01/2017   endovenous laser ablation left greater saphenous vein by Josephina GipJames Lawson MD   . ENDOVENOUS ABLATION SAPHENOUS VEIN W/ LASER Right 06/27/2017   endovenous laser ablation right GSV  by Josephina GipJames Lawson MD  . INGUINAL HERNIA REPAIR    . LUMBAR SPINE SURGERY    . TONSILLECTOMY      There were no vitals filed for this visit.  Subjective Assessment - 09/26/19 1237    Subjective  Had an almost fall - was mopping some floors and fell down on one knee - was able to get himself back up. Has  not tried any techniques at home to elevate his BLEs.    Pertinent History  peripheral neuropathy, peripheral edema due to chronic venous stasis, pancreatitis due to biliary obstruction, OSA on CPAP, obesity, HTN, hydrocephalus, melanoma, chronic low back pain, lumbar surgery, bilat carpal tunnel release, endovenous ablation saphenous vein with laser, insomnia, sepsis due to LE cellulitis, chronic diastolic CHF, cervicalgia and multiple falls    Diagnostic tests  CT scan - 1. Marked enlargement of the third and lateral ventricles with effacement of the sulci consistent with obstructive hydrocephalus.  There is no change compared to the 03/14/2019 CT scan.  Older CT scans were not available for comparison and 2.   No acute findings    Patient Stated Goals  "take 20 years off my life" - walking without a cane, RW or w/c.    Pain Score  9     Pain Location  --   from the base of the neck all the way down.   Pain Orientation  Right;Left    Pain Descriptors / Indicators  Aching;Throbbing    Pain Type  Chronic pain    Pain Onset  More than a month ago    Aggravating Factors  cold weather                       [facility] Adult PT Treatment/Exercise - 09/27/19 0001      Transfers   Transfers  Sit to Stand;Stand to Sit    Sit to Stand  4: Min guard    Stand to Sit  4: Min guard    Comments  Educated pt on proper transfer for sit <> stand with RW, instructed pt to have one hand to push off mat table and one hand on RW for safety, pt with tendency to grab both hands on RW. Takes increased time.       Ambulation/Gait   Ambulation/Gait  Yes    Ambulation/Gait Assistance  4: Min guard    Ambulation/Gait Assistance Details  Pt came into clinic wearing gripper socks and slip on sandals. Therapist asking pt if he wears sneakers for increased support - pt prefers to ambulate in socks because he feels like he can feel the floor better. Had discussion with pt about using RW vs. SPC for safety due to  increased fall risk. All ambulation during session today with RW - cues for upright posture and keeping RW close to body (pt with tendency to push too far anteriorly). Therapist ambulated out to pt's car with pt at end of session with RW for safety - pt ambulated into clinic today with SPC.     Ambulation Distance (Feet)  115 Feet   x1 indoors, 300 indoors/outdoors   Assistive device  Rolling walker    Gait Pattern  Step-to pattern;Decreased step length - right;Decreased step length - left;Decreased stride length;Decreased hip/knee flexion - right;Decreased hip/knee flexion - left;Decreased dorsiflexion - right;Decreased dorsiflexion - left;Trunk flexed;Poor foot clearance - left;Poor foot clearance - right;Step-through pattern    Ambulation Surface  Level;Indoor;Outdoor;Paved      Access Code: [facility]  URL: [email or web address]  Date: 09/26/2019  Prepared by: Sherlie Ban   Initiated HEP in sitting:   Exercises Seated Heel Toe Raises - 10 reps - 2 sets - 2x daily - 7x weekly Seated Long Arc Quad - 10 reps - 2 sets - 2x daily - 7x weekly Seated Heel Slide - 10 reps - 2 sets - 2x daily - 7x weekly Seated Isometric Hip Adduction with Ball - 10 reps - 2 sets - 2x daily - 7x weekly        PT Education - 09/26/19 1508    Education Details  initial HEP - seated exercises, fall risk - using RW for mobility vs. SPC for increased safety.    Person(s) Educated  Patient    Methods  Explanation;Demonstration;Handout    Comprehension  Verbalized understanding;Returned demonstration;Need further instruction       PT Short Term Goals - 09/06/19 2115      PT SHORT TERM GOAL #1   Title  Pt will initiate standing balance, endurance and LE strengthening HEP    Time  4    Period  Weeks    Status  New    Target Date  10/06/19      PT SHORT TERM GOAL #2   Title  Pt will improve TUG to </= 60 seconds with LRAD and supervision    Baseline  1:21 with cane with min-mod A     Time  4    Period  Weeks    Status  New    Target Date  10/06/19      PT SHORT  TERM GOAL #3   Title  Pt will improve standing balance as indicated by 4 point improvement in BERG    Baseline  16/56    Time  4    Period  Weeks    Status  New    Target Date  10/06/19      PT SHORT TERM GOAL #4   Title  Pt will negotiate 4 stairs with two rails with supervision and decreased use of UE to pull self up stairs    Time  4    Period  Weeks    Status  New    Target Date  10/06/19      PT SHORT TERM GOAL #5   Title  Pt will ambulate x 115' with LRAD and supervision over indoor, level surfaces to indicate improved endurance    Time  4    Period  Weeks    Status  New    Target Date  10/06/19        PT Long Term Goals - 09/06/19 2121      PT LONG TERM GOAL #1   Title  Pt will demonstrate independence with final LE strength and balance HEP    Time  8    Period  Weeks    Status  New    Target Date  11/05/19      PT LONG TERM GOAL #2   Title  Pt will demonstrate decreased falls risk as indicated by decrease in TUG to </= 45 seconds with LRAD    Time  8    Period  Weeks    Status  New    Target Date  11/05/19      PT LONG TERM GOAL #3   Title  Pt will improve BERG balance to >/= 24/56    Time  8    Period  Weeks    Status  New    Target Date  11/05/19      PT LONG TERM GOAL #4   Title  Pt will ambulate x 230' over indoor, level surfaces with LRAD, supervision and increased upright trunk posture    Time  8    Period  Weeks    Status  New    Target Date  11/05/19            Plan - 09/26/19 1510    Clinical Impression Statement  Focus of today's skilled session was patient education to improve safety and reduce falls at home. Pt continues to use his SPC in the home vs. RW. At end of session pt verbalized understanding of using his RW for balance, but initially was still hesistant because he states "he wants to challenge himself". Pt needs frequent re-direction to the  task at home. Initiated HEP to include seated LE strengthening exercises, pt able to tolerate well. Also focused on gait training with bariatric RW - will need further intstruction esp with transfers. Will continue to progress towards LTGs.    Personal Factors and Comorbidities  Behavior Pattern;Comorbidity 3+;Fitness;Past/Current Experience;Social Background    Comorbidities  peripheral neuropathy, peripheral edema due to chronic venous stasis, pancreatitis due to biliary obstruction, OSA on CPAP, obesity, HTN, hydrocephalus, melanoma, chronic low back pain, lumbar surgery, bilat carpal tunnel release, endovenous ablation saphenous vein with laser, insomnia, sepsis due to LE cellulitis, chronic diastolic CHF, cervicalgia and multiple falls    Examination-Activity Limitations  Bend;Locomotion Level;Stairs;Stand;Transfers    Examination-Participation Restrictions  Driving    Stability/Clinical Decision Making  Evolving/Moderate complexity    Rehab Potential  Fair    PT Frequency  2x / week    PT Duration  8 weeks    PT Treatment/Interventions  ADLs/Self Care Home Management;Cryotherapy;Electrical Stimulation;Moist Heat;DME Instruction;Gait training;Stair training;Functional mobility training;Therapeutic activities;Therapeutic exercise;Balance training;Neuromuscular re-education;Patient/family education;Orthotic Fit/Training;Passive range of motion    PT Next Visit Plan  Did he get an appointment at [facility]?  assess stairs.  add to HEP as appropriate (only has seated LE strengthening),  pt states he has a bariatric walker at home (and states that he will begin using it at home, want to check on this), sit <> stands from elevated surface.    PT Home Exercise Plan  [email or web address]    Consulted and Agree with Plan of Care  Patient       Patient will benefit from skilled therapeutic intervention in order to improve the following deficits and impairments:  Abnormal gait, Cardiopulmonary status  limiting activity, Decreased activity tolerance, Decreased balance, Decreased endurance, Decreased strength, Difficulty walking, Impaired sensation, Postural dysfunction, Pain, Decreased range of motion  Visit Diagnosis: Other abnormalities of gait and mobility  Unsteadiness on feet  Abnormal posture  Repeated falls  Muscle weakness     Problem List Patient Active Problem List   Diagnosis Date Noted  . Hypotension   . Fall   . Severe sepsis (HCC) 03/14/2019  . Varicose veins of bilateral lower extremities with other complications 05/23/2017  . Cellulitis of lower extremity   . Sepsis due to cellulitis (HCC) 03/21/2017  . Essential hypertension 03/21/2017  . Chronic diastolic CHF (congestive heart failure) (HCC) 03/21/2017  . Renal insufficiency 03/21/2017  . Meralgia paresthetica 10/12/2013  . Polyneuropathy in other diseases classified elsewhere (HCC) 11/22/2012  . Obstructive sleep apnea (adult) (pediatric) 11/22/2012  . Abnormality of gait 11/22/2012  . Headache(784.0) 11/22/2012  . Pain in limb 11/22/2012  . Lumbago 11/22/2012  . Cervicalgia 11/22/2012  . Spinal stenosis in cervical region 11/22/2012  . Degeneration of intervertebral disc, site unspecified 11/22/2012  . Degeneration of cervical intervertebral disc 11/22/2012  . Obstructive hydrocephalus (HCC) 11/22/2012    Drake Leach, PT, DPT  09/27/2019, 9:02 AM   [facility] [address] [place], [place], [address] Phone: [phone]   Fax:  [phone]  Name: DALLAN SCHONBERG MRN: [id number] Date of Birth: 1956/06/01     This note is not being shared with the patient for the following reason: To respect privacy (The patient or proxy has requested that the information not be shared).

## 2019-09-26 NOTE — Patient Instructions (Signed)
Access Code: [facility]  URL: [email or web address]  Date: 09/26/2019  Prepared by: Janann August   Exercises Seated Heel Toe Raises - 10 reps - 2 sets - 2x daily - 7x weekly Seated Long Arc Quad - 10 reps - 2 sets - 2x daily - 7x weekly Seated Heel Slide - 10 reps - 2 sets - 2x daily - 7x weekly Seated Isometric Hip Adduction with Ball - 10 reps - 2 sets - 2x daily - 7x weekly

## 2019-10-03 ENCOUNTER — Ambulatory Visit: Payer: Medicare Other | Admitting: Physical Therapy

## 2019-10-03 ENCOUNTER — Other Ambulatory Visit: Payer: Self-pay

## 2019-10-03 ENCOUNTER — Encounter: Payer: Self-pay | Admitting: Physical Therapy

## 2019-10-03 DIAGNOSIS — M6281 Muscle weakness (generalized): Secondary | ICD-10-CM

## 2019-10-03 DIAGNOSIS — R2689 Other abnormalities of gait and mobility: Secondary | ICD-10-CM | POA: Diagnosis not present

## 2019-10-03 DIAGNOSIS — R293 Abnormal posture: Secondary | ICD-10-CM

## 2019-10-03 DIAGNOSIS — R296 Repeated falls: Secondary | ICD-10-CM

## 2019-10-03 DIAGNOSIS — R2681 Unsteadiness on feet: Secondary | ICD-10-CM

## 2019-10-03 NOTE — Therapy (Signed)
[facility] [address] [place], [place], [address] Phone: [phone]   Fax:  [phone]  Physical Therapy Treatment  Patient Details  Name: Warren Rancherdmund L Geathers MRN: [id number] Date of Birth: 09/06/1956 Referring Provider (PT): York Spanielharles K [name], MD   Encounter Date: 10/03/2019  PT End of Session - 10/03/19 1617    Visit Number  4    Number of Visits  17    Date for PT Re-Evaluation  11/05/19    Authorization Type  [employer] Medicare; $35 copay.  10th visit PN    PT Start Time  1447    PT Stop Time  1535    PT Time Calculation (min)  48 min    Activity Tolerance  Patient tolerated treatment well    Behavior During Therapy  WFL for tasks assessed/performed   verbose, needs frequent re-direction      Past Medical History:  Diagnosis Date  . Cataracts, bilateral   . Chronic insomnia   . Chronic low back pain   . Gait disorder   . History of headache   . History of melanoma   . Hydrocephalus (HCC)   . Hypertension   . Obesity   . Obstructive sleep apnea on CPAP   . Pancreatitis    Secondary to gallstones  . Pancreatitis due to biliary obstruction   . Peripheral edema    Chronic venous stasis  . Peripheral neuropathy     Past Surgical History:  Procedure Laterality Date  . CARPAL TUNNEL RELEASE Bilateral   . CHOLECYSTECTOMY    . ENDOVENOUS ABLATION SAPHENOUS VEIN W/ LASER Left 06/01/2017   endovenous laser ablation left greater saphenous vein by Josephina GipJames Lawson MD   . ENDOVENOUS ABLATION SAPHENOUS VEIN W/ LASER Right 06/27/2017   endovenous laser ablation right GSV  by Josephina GipJames Lawson MD  . INGUINAL HERNIA REPAIR    . LUMBAR SPINE SURGERY    . TONSILLECTOMY      There were no vitals filed for this visit.  Subjective Assessment - 10/03/19 1456    Subjective  No falls since last week; brought in RW, ski had broken off one of the back legs.  RW tipped backwards.  Has been elevating LE during the day.  Still having  significant LE edema.  Exercises are hard but doable.    Pertinent History  peripheral neuropathy, peripheral edema due to chronic venous stasis, pancreatitis due to biliary obstruction, OSA on CPAP, obesity, HTN, hydrocephalus, melanoma, chronic low back pain, lumbar surgery, bilat carpal tunnel release, endovenous ablation saphenous vein with laser, insomnia, sepsis due to LE cellulitis, chronic diastolic CHF, cervicalgia and multiple falls    Diagnostic tests  CT scan - 1. Marked enlargement of the third and lateral ventricles with effacement of the sulci consistent with obstructive hydrocephalus.  There is no change compared to the 03/14/2019 CT scan.  Older CT scans were not available for comparison and 2.   No acute findings    Patient Stated Goals  "take 20 years off my life" - walking without a cane, RW or w/c.    Currently in Pain?  Yes    Pain Onset  More than a month ago         [facility] PT Assessment - 10/03/19 1502      6 Minute Walk- Baseline   6 Minute Walk- Baseline  yes    BP (mmHg)  137/72    HR (bpm)  78    02 Sat (%RA)  98 %    Modified Borg Scale for Dyspnea  1- Very mild shortness of breath    Perceived Rate of Exertion (Borg)  9- very light      6 Minute walk- Post Test   6 Minute Walk Post Test  yes    BP (mmHg)  148/60    HR (bpm)  84    02 Sat (%RA)  98 %    Modified Borg Scale for Dyspnea  2- Mild shortness of breath    Perceived Rate of Exertion (Borg)  11- Fairly light      6 minute walk test results    Aerobic Endurance Distance Walked  141    Endurance additional comments  2 minute walk test with RW       Access Code: [facility]  URL: [email or web address]  Date: 10/03/2019  Prepared by: Misty Stanley   Exercises Seated Heel Toe Raises - 10 reps - 2 sets - 2x daily - 7x weekly Seated Long Arc Quad - 10 reps - 2 sets - 2x daily - 7x weekly Seated Heel Slide - 10 reps - 2 sets - 2x daily - 7x weekly Seated Isometric Hip Adduction with Ball  - 10 reps - 2 sets - 2x daily - 7x weekly Sit to Stand with Armchair - 10 reps - 2 sets - 1x daily - 7x weekly      PT Education - 10/03/19 1616    Education Details  added walking program and repeated sit <> stand to HEP; added tennis balls to back of RW and educated pt that if he chose to keep walker tipped he would have to replace the tennis balls frequently due to increased pressure through posterior legs    Person(s) Educated  Patient    Methods  Explanation;Demonstration;Handout    Comprehension  Verbalized understanding;Returned demonstration       PT Short Term Goals - 09/06/19 2115      PT SHORT TERM GOAL #1   Title  Pt will initiate standing balance, endurance and LE strengthening HEP    Time  4    Period  Weeks    Status  New    Target Date  10/06/19      PT SHORT TERM GOAL #2   Title  Pt will improve TUG to </= 60 seconds with LRAD and supervision    Baseline  1:21 with cane with min-mod A    Time  4    Period  Weeks    Status  New    Target Date  10/06/19      PT SHORT TERM GOAL #3   Title  Pt will improve standing balance as indicated by 4 point improvement in BERG    Baseline  16/56    Time  4    Period  Weeks    Status  New    Target Date  10/06/19      PT SHORT TERM GOAL #4   Title  Pt will negotiate 4 stairs with two rails with supervision and decreased use of UE to pull self up stairs    Time  4    Period  Weeks    Status  New    Target Date  10/06/19      PT SHORT TERM GOAL #5   Title  Pt will ambulate x 115' with LRAD and supervision over indoor, level surfaces to indicate improved endurance    Time  4    Period  Weeks  Status  New    Target Date  10/06/19        PT Long Term Goals - 10/03/19 1622      PT LONG TERM GOAL #1   Title  Pt will demonstrate independence with final LE strength and balance HEP  (11/05/2019 due date for all LTG)    Time  8    Period  Weeks    Status  New      PT LONG TERM GOAL #2   Title  Pt will  demonstrate decreased falls risk as indicated by decrease in TUG to </= 45 seconds with LRAD    Time  8    Period  Weeks    Status  New      PT LONG TERM GOAL #3   Title  Pt will improve BERG balance to >/= 24/56    Time  8    Period  Weeks    Status  New      PT LONG TERM GOAL #4   Title  Pt will ambulate x 230' over indoor, level surfaces with LRAD, supervision and increased upright trunk posture    Time  8    Period  Weeks    Status  New      PT LONG TERM GOAL #5   Title  Pt will improve distance on 2 minute walk test by 40' while maintaining RPE at 12-13    Baseline  141'    Time  8    Period  Weeks    Status  New    Target Date  11/05/19            Plan - 10/03/19 1618    Clinical Impression Statement  Pt returns reporting he is using the RW more but continues to use the cane in his home because he can use his other UE to touch furniture and walls.  Continued to educate pt on safety and that the RW would provide him with greater balance and support when standing to decrease back pain.  Following 2 minute test of endurance, PT did recommend that pt utilize RW when performing walking program at home to allow for more continuous walking and safety.  Pt agreeable.  Also added repeated sit <> stand to HEP for LE strengthening.  Pt reporting fatigue at end of session and required min A to stand from chair.    Personal Factors and Comorbidities  Behavior Pattern;Comorbidity 3+;Fitness;Past/Current Experience;Social Background    Comorbidities  peripheral neuropathy, peripheral edema due to chronic venous stasis, pancreatitis due to biliary obstruction, OSA on CPAP, obesity, HTN, hydrocephalus, melanoma, chronic low back pain, lumbar surgery, bilat carpal tunnel release, endovenous ablation saphenous vein with laser, insomnia, sepsis due to LE cellulitis, chronic diastolic CHF, cervicalgia and multiple falls    Examination-Activity Limitations  Bend;Locomotion  Level;Stairs;Stand;Transfers    Examination-Participation Restrictions  Driving    Stability/Clinical Decision Making  Evolving/Moderate complexity    Rehab Potential  Fair    PT Frequency  2x / week    PT Duration  8 weeks    PT Treatment/Interventions  ADLs/Self Care Home Management;Cryotherapy;Electrical Stimulation;Moist Heat;DME Instruction;Gait training;Stair training;Functional mobility training;Therapeutic activities;Therapeutic exercise;Balance training;Neuromuscular re-education;Patient/family education;Orthotic Fit/Training;Passive range of motion    PT Next Visit Plan  How was the walking program and sit <> stand at home?  Warm up on SCI fit.  assess stairs.  add to HEP as appropriate, sit <> stands from elevated surface.  Hip flexor stretch?  PT Home Exercise Plan  [email or web address]    Consulted and Agree with Plan of Care  Patient       Patient will benefit from skilled therapeutic intervention in order to improve the following deficits and impairments:  Abnormal gait, Cardiopulmonary status limiting activity, Decreased activity tolerance, Decreased balance, Decreased endurance, Decreased strength, Difficulty walking, Impaired sensation, Postural dysfunction, Pain, Decreased range of motion  Visit Diagnosis: Other abnormalities of gait and mobility  Unsteadiness on feet  Abnormal posture  Repeated falls  Muscle weakness (generalized)     Problem List Patient Active Problem List   Diagnosis Date Noted  . Hypotension   . Fall   . Severe sepsis (HCC) 03/14/2019  . Varicose veins of bilateral lower extremities with other complications 05/23/2017  . Cellulitis of lower extremity   . Sepsis due to cellulitis (HCC) 03/21/2017  . Essential hypertension 03/21/2017  . Chronic diastolic CHF (congestive heart failure) (HCC) 03/21/2017  . Renal insufficiency 03/21/2017  . Meralgia paresthetica 10/12/2013  . Polyneuropathy in other diseases classified elsewhere (HCC) 11/22/2012  .  Obstructive sleep apnea (adult) (pediatric) 11/22/2012  . Abnormality of gait 11/22/2012  . Headache(784.0) 11/22/2012  . Pain in limb 11/22/2012  . Lumbago 11/22/2012  . Cervicalgia 11/22/2012  . Spinal stenosis in cervical region 11/22/2012  . Degeneration of intervertebral disc, site unspecified 11/22/2012  . Degeneration of cervical intervertebral disc 11/22/2012  . Obstructive hydrocephalus (HCC) 11/22/2012    Dierdre Highman, PT, DPT 10/03/19    4:25 PM    [facility] [address] [place], [place], [address] Phone: [phone]   Fax:  [phone]  Name: [name] MRN: [id number] Date of Birth: 21-Feb-1956

## 2019-10-03 NOTE — Patient Instructions (Addendum)
Walking Program: Using the walker - walk for a continuous 3 minutes (180 seconds) doing laps in your house.   Please try to do it twice a day - once in the mid morning and once in the afternoon.       Access Code: [facility]  URL: [email or web address]  Date: 10/03/2019  Prepared by: Misty Stanley   Exercises Seated Heel Toe Raises - 10 reps - 2 sets - 2x daily - 7x weekly Seated Long Arc Quad - 10 reps - 2 sets - 2x daily - 7x weekly Seated Heel Slide - 10 reps - 2 sets - 2x daily - 7x weekly Seated Isometric Hip Adduction with Ball - 10 reps - 2 sets - 2x daily - 7x weekly Sit to Stand with Armchair - 10 reps - 2 sets - 1x daily - 7x weekly

## 2019-10-05 ENCOUNTER — Other Ambulatory Visit: Payer: Self-pay

## 2019-10-05 ENCOUNTER — Ambulatory Visit: Payer: Medicare Other | Admitting: Physical Therapy

## 2019-10-05 ENCOUNTER — Encounter: Payer: Self-pay | Admitting: Physical Therapy

## 2019-10-05 DIAGNOSIS — M6281 Muscle weakness (generalized): Secondary | ICD-10-CM

## 2019-10-05 DIAGNOSIS — R296 Repeated falls: Secondary | ICD-10-CM

## 2019-10-05 DIAGNOSIS — R2681 Unsteadiness on feet: Secondary | ICD-10-CM

## 2019-10-05 DIAGNOSIS — R2689 Other abnormalities of gait and mobility: Secondary | ICD-10-CM | POA: Diagnosis not present

## 2019-10-05 DIAGNOSIS — R293 Abnormal posture: Secondary | ICD-10-CM

## 2019-10-05 NOTE — Therapy (Signed)
[facility] [address] [place], [place], [address] Phone: [phone]   Fax:  [phone]  Physical Therapy Treatment  Patient Details  Name: [name] MRN: [id number] Date of Birth: January 14, 1956 Referring Provider (PT): York Spaniel, MD   Encounter Date: 10/05/2019  PT End of Session - 10/05/19 1250    Visit Number  5    Number of Visits  17    Date for PT Re-Evaluation  11/05/19    Authorization Type  [employer] Medicare; $35 copay.  10th visit PN    PT Start Time  1117   arrived late   PT Stop Time  1150    PT Time Calculation (min)  33 min    Activity Tolerance  Patient tolerated treatment well    Behavior During Therapy  WFL for tasks assessed/performed   verbose, needs frequent re-direction      Past Medical History:  Diagnosis Date  . Cataracts, bilateral   . Chronic insomnia   . Chronic low back pain   . Gait disorder   . History of headache   . History of melanoma   . Hydrocephalus (HCC)   . Hypertension   . Obesity   . Obstructive sleep apnea on CPAP   . Pancreatitis    Secondary to gallstones  . Pancreatitis due to biliary obstruction   . Peripheral edema    Chronic venous stasis  . Peripheral neuropathy     Past Surgical History:  Procedure Laterality Date  . CARPAL TUNNEL RELEASE Bilateral   . CHOLECYSTECTOMY    . ENDOVENOUS ABLATION SAPHENOUS VEIN W/ LASER Left 06/01/2017   endovenous laser ablation left greater saphenous vein by Josephina Gip MD   . ENDOVENOUS ABLATION SAPHENOUS VEIN W/ LASER Right 06/27/2017   endovenous laser ablation right GSV  by Josephina Gip MD  . INGUINAL HERNIA REPAIR    . LUMBAR SPINE SURGERY    . TONSILLECTOMY      There were no vitals filed for this visit.  Subjective Assessment - 10/05/19 1121    Subjective  Pt late today because one of his dogs got out and was trying to get him back inside.   Pt was very sore after last session; took about two days  to recover.  Has been doing laps at home but has been performing 4 laps - 15 minutes 2x/day.  Reports it is "strenuous".    Pertinent History  peripheral neuropathy, peripheral edema due to chronic venous stasis, pancreatitis due to biliary obstruction, OSA on CPAP, obesity, HTN, hydrocephalus, melanoma, chronic low back pain, lumbar surgery, bilat carpal tunnel release, endovenous ablation saphenous vein with laser, insomnia, sepsis due to LE cellulitis, chronic diastolic CHF, cervicalgia and multiple falls    Diagnostic tests  CT scan - 1. Marked enlargement of the third and lateral ventricles with effacement of the sulci consistent with obstructive hydrocephalus.  There is no change compared to the 03/14/2019 CT scan.  Older CT scans were not available for comparison and 2.   No acute findings    Patient Stated Goals  "take 20 years off my life" - walking without a cane, RW or w/c.    Pain Onset  More than a month ago                       [facility] Adult PT Treatment/Exercise - 10/05/19 1126      Ambulation/Gait   Ambulation/Gait  Yes  Ambulation/Gait Assistance  5: Supervision    Ambulation/Gait Assistance Details  with RW, still with significant forward flexion.  Wearing gripper socks, no shoes today    Ambulation Distance (Feet)  75 Feet    Assistive device  Rolling walker    Gait Pattern  Step-through pattern;Decreased step length - right;Decreased step length - left;Decreased stride length;Trunk flexed;Wide base of support    Ambulation Surface  Level;Indoor    Stairs  Yes    Stairs Assistance  5: Supervision;4: Min guard    Stairs Assistance Details (indicate cue type and reason)  supervision to ascend, min guard when descending due to feeling as if he is going to fall forwards.     Stair Management Technique  Two rails;Step to pattern;Forwards    Number of Stairs  4    Height of Stairs  6      Therapeutic Activites    Therapeutic Activities  Other Therapeutic  Activities    Other Therapeutic Activities  Discussed appropriate dosing of walking program due to pt reporting it as "strenuous".  Recommended pt back down to 2 laps or 5 minutes of walking, 2x/day.  Discussed that PT would work up to 4 laps with time as endurance improved.      Exercises   Exercises  Knee/Hip      Knee/Hip Exercises: Standing   Forward Step Up  Right;Left;5 reps;Hand Hold: 2;Step Height: 6"    Forward Step Up Limitations  alternating LE, bilat UE support on rails          Balance Exercises - 10/05/19 1220      Balance Exercises: Standing   Standing, One Foot on a Step  Eyes open;6 inch   2 sets x 5 reps total; no UE support x 5 seconds, min-mod A       PT Education - 10/05/19 1246    Education Details  reviewed parameters for walking program to avoid over-exertion and will gradually progress time and distance    Person(s) Educated  Patient    Methods  Explanation    Comprehension  Verbalized understanding       PT Short Term Goals - 09/06/19 2115      PT SHORT TERM GOAL #1   Title  Pt will initiate standing balance, endurance and LE strengthening HEP    Time  4    Period  Weeks    Status  New    Target Date  10/06/19      PT SHORT TERM GOAL #2   Title  Pt will improve TUG to </= 60 seconds with LRAD and supervision    Baseline  1:21 with cane with min-mod A    Time  4    Period  Weeks    Status  New    Target Date  10/06/19      PT SHORT TERM GOAL #3   Title  Pt will improve standing balance as indicated by 4 point improvement in BERG    Baseline  16/56    Time  4    Period  Weeks    Status  New    Target Date  10/06/19      PT SHORT TERM GOAL #4   Title  Pt will negotiate 4 stairs with two rails with supervision and decreased use of UE to pull self up stairs    Time  4    Period  Weeks    Status  New    Target Date  10/06/19      PT SHORT TERM GOAL #5   Title  Pt will ambulate x 115' with LRAD and supervision over indoor, level  surfaces to indicate improved endurance    Time  4    Period  Weeks    Status  New    Target Date  10/06/19        PT Long Term Goals - 10/03/19 1622      PT LONG TERM GOAL #1   Title  Pt will demonstrate independence with final LE strength and balance HEP  (11/05/2019 due date for all LTG)    Time  8    Period  Weeks    Status  New      PT LONG TERM GOAL #2   Title  Pt will demonstrate decreased falls risk as indicated by decrease in TUG to </= 45 seconds with LRAD    Time  8    Period  Weeks    Status  New      PT LONG TERM GOAL #3   Title  Pt will improve BERG balance to >/= 24/56    Time  8    Period  Weeks    Status  New      PT LONG TERM GOAL #4   Title  Pt will ambulate x 230' over indoor, level surfaces with LRAD, supervision and increased upright trunk posture    Time  8    Period  Weeks    Status  New      PT LONG TERM GOAL #5   Title  Pt will improve distance on 2 minute walk test by 40' while maintaining RPE at 12-13    Baseline  141'    Time  8    Period  Weeks    Status  New    Target Date  11/05/19            Plan - 10/05/19 1253    Clinical Impression Statement  Pt continues to follow recommendations and is using RW consistently and is using for walking program at home.  Pt attempted to progress walking program to 15 minutes but also reporting increased soreness today from "neck down".   Re-educated pt on progression of walking program and HEP.  Pt verbalized understanding.  Performed assessment of stairs and LE functional strengthening today.  Pt required frequent rest breaks.  Unable to assess STG today due to pt arriving late.  Will assess at next session.    Personal Factors and Comorbidities  Behavior Pattern;Comorbidity 3+;Fitness;Past/Current Experience;Social Background    Comorbidities  peripheral neuropathy, peripheral edema due to chronic venous stasis, pancreatitis due to biliary obstruction, OSA on CPAP, obesity, HTN, hydrocephalus,  melanoma, chronic low back pain, lumbar surgery, bilat carpal tunnel release, endovenous ablation saphenous vein with laser, insomnia, sepsis due to LE cellulitis, chronic diastolic CHF, cervicalgia and multiple falls    Examination-Activity Limitations  Bend;Locomotion Level;Stairs;Stand;Transfers    Examination-Participation Restrictions  Driving    Stability/Clinical Decision Making  Evolving/Moderate complexity    Rehab Potential  Fair    PT Frequency  2x / week    PT Duration  8 weeks    PT Treatment/Interventions  ADLs/Self Care Home Management;Cryotherapy;Electrical Stimulation;Moist Heat;DME Instruction;Gait training;Stair training;Functional mobility training;Therapeutic activities;Therapeutic exercise;Balance training;Neuromuscular re-education;Patient/family education;Orthotic Fit/Training;Passive range of motion    PT Next Visit Plan  Check STG.  Warm up on SCI fit.  add to HEP as appropriate - standing balance and LE strengthening.  Hip flexor stretch?    PT Home Exercise Plan  [email or web address]    Consulted and Agree with Plan of Care  Patient       Patient will benefit from skilled therapeutic intervention in order to improve the following deficits and impairments:  Abnormal gait, Cardiopulmonary status limiting activity, Decreased activity tolerance, Decreased balance, Decreased endurance, Decreased strength, Difficulty walking, Impaired sensation, Postural dysfunction, Pain, Decreased range of motion  Visit Diagnosis: Other abnormalities of gait and mobility  Unsteadiness on feet  Abnormal posture  Repeated falls  Muscle weakness (generalized)     Problem List Patient Active Problem List   Diagnosis Date Noted  . Hypotension   . Fall   . Severe sepsis ([place]) 03/14/2019  . Varicose veins of bilateral lower extremities with other complications 14/97/0263  . Cellulitis of lower extremity   . Sepsis due to cellulitis ([place]) 03/21/2017  . Essential hypertension 03/21/2017  .  Chronic diastolic CHF (congestive heart failure) ([place]) 03/21/2017  . Renal insufficiency 03/21/2017  . Meralgia paresthetica 10/12/2013  . Polyneuropathy in other diseases classified elsewhere ([place]) 11/22/2012  . Obstructive sleep apnea (adult) (pediatric) 11/22/2012  . Abnormality of gait 11/22/2012  . Headache(784.0) 11/22/2012  . Pain in limb 11/22/2012  . Lumbago 11/22/2012  . Cervicalgia 11/22/2012  . Spinal stenosis in cervical region 11/22/2012  . Degeneration of intervertebral disc, site unspecified 11/22/2012  . Degeneration of cervical intervertebral disc 11/22/2012  . Obstructive hydrocephalus ([place]) 11/22/2012    [name], PT, DPT 10/05/19    12:59 PM    [facility] [address] [place], [place], [address] Phone: [phone]   Fax:  [phone]  Name: [name] MRN: [id number] Date of Birth: 11-05-1955

## 2019-10-08 ENCOUNTER — Ambulatory Visit: Payer: Medicare Other | Admitting: Physical Therapy

## 2019-10-08 ENCOUNTER — Encounter: Payer: Self-pay | Admitting: Physical Therapy

## 2019-10-08 ENCOUNTER — Other Ambulatory Visit: Payer: Self-pay

## 2019-10-08 DIAGNOSIS — R293 Abnormal posture: Secondary | ICD-10-CM

## 2019-10-08 DIAGNOSIS — R2689 Other abnormalities of gait and mobility: Secondary | ICD-10-CM | POA: Diagnosis not present

## 2019-10-08 DIAGNOSIS — M6281 Muscle weakness (generalized): Secondary | ICD-10-CM

## 2019-10-08 DIAGNOSIS — R296 Repeated falls: Secondary | ICD-10-CM

## 2019-10-08 DIAGNOSIS — R2681 Unsteadiness on feet: Secondary | ICD-10-CM

## 2019-10-09 NOTE — Therapy (Signed)
[facility] [address] [place], [place], [address] Phone: [phone]   Fax:  [phone]  Physical Therapy Treatment  Patient Details  Name: [name] MRN: [id number] Date of Birth: [birth year] Referring Provider (PT): Kathrynn Ducking, MD   Encounter Date: 10/08/2019  PT End of Session - 10/08/19 1555    Visit Number  6    Number of Visits  17    Date for PT Re-Evaluation  11/05/19    Authorization Type  [employer] Medicare; $35 copay.  10th visit PN    PT Start Time  1455    PT Stop Time  1533    PT Time Calculation (min)  38 min    Activity Tolerance  Patient tolerated treatment well    Behavior During Therapy  WFL for tasks assessed/performed   verbose, needs frequent re-direction      Past Medical History:  Diagnosis Date  . Cataracts, bilateral   . Chronic insomnia   . Chronic low back pain   . Gait disorder   . History of headache   . History of melanoma   . Hydrocephalus ([place])   . Hypertension   . Obesity   . Obstructive sleep apnea on CPAP   . Pancreatitis    Secondary to gallstones  . Pancreatitis due to biliary obstruction   . Peripheral edema    Chronic venous stasis  . Peripheral neuropathy     Past Surgical History:  Procedure Laterality Date  . CARPAL TUNNEL RELEASE Bilateral   . CHOLECYSTECTOMY    . ENDOVENOUS ABLATION SAPHENOUS VEIN W/ LASER Left 06/01/2017   endovenous laser ablation left greater saphenous vein by Tinnie Gens MD   . ENDOVENOUS ABLATION SAPHENOUS VEIN W/ LASER Right 06/27/2017   endovenous laser ablation right GSV  by Tinnie Gens MD  . [place]    . LUMBAR SPINE SURGERY    . TONSILLECTOMY      There were no vitals filed for this visit.  Subjective Assessment - 10/08/19 1450    Subjective  No falls. He is exercises are hard so can not always do them.    Pertinent History  peripheral neuropathy, peripheral edema due to chronic venous stasis,  pancreatitis due to biliary obstruction, OSA on CPAP, obesity, HTN, hydrocephalus, melanoma, chronic low back pain, lumbar surgery, bilat carpal tunnel release, endovenous ablation saphenous vein with laser, insomnia, sepsis due to LE cellulitis, chronic diastolic CHF, cervicalgia and multiple falls    Diagnostic tests  CT scan - 1. Marked enlargement of the third and lateral ventricles with effacement of the sulci consistent with obstructive hydrocephalus.  There is no change compared to the 03/14/2019 CT scan.  Older CT scans were not available for comparison and 2.   No acute findings    Patient Stated Goals  "take 20 years off my life" - walking without a cane, RW or w/c.    Currently in Pain?  Yes    Pain Score  10-Worst pain ever    Pain Location  Other (Comment)   all over   Pain Descriptors / Indicators  Aching;Throbbing    Pain Type  Chronic pain    Pain Onset  More than a month ago    Pain Frequency  Constant    Aggravating Factors   moving    Pain Relieving Factors  sitting still some, hydrocodone but limits taking it other than night time  [facility] PT Assessment - 10/09/19 0001      Assessment   Medical Diagnosis  Gait abnormality due to hydrocephalus and peripheral neuropathy    Referring Provider (PT)  Kathrynn Ducking, MD                   [facility] Adult PT Treatment/Exercise - 10/08/19 1450      Ambulation/Gait   Ambulation/Gait  Yes    Ambulation/Gait Assistance  5: Supervision    Ambulation/Gait Assistance Details  position to RW to facilitate upright posture.  PT adjusted his RW up 1 notch to facilitate more upright posture.     Ambulation Distance (Feet)  115 Feet    Assistive device  Rolling walker    Gait Pattern  Step-through pattern;Decreased step length - right;Decreased step length - left;Decreased stride length;Trunk flexed;Wide base of support    Stairs  Yes    Stairs Assistance  5: Supervision;4: Min guard    Stairs Assistance Details  (indicate cue type and reason)  Sequence up with "good" /stronger LE & down with Pt reports his steps are steeper and only has right rail.  PT demo & verbal cues to modification of quarter turn towards rail and descending can have toes over edge up to MTPs to enable knee flexion / lowering first foot to step.      Stair Management Technique  Two rails;Step to pattern;Forwards    Number of Stairs  4    Height of Stairs  6      Therapeutic Activites    Therapeutic Activities  --    Other Therapeutic Activities  --      Exercises   Exercises  --      Knee/Hip Exercises: Standing   Forward Step Up  --    Forward Step Up Limitations  --               PT Short Term Goals - 10/08/19 1849      PT SHORT TERM GOAL #1   Title  Pt will initiate standing balance, endurance and LE strengthening HEP    Baseline  MET 10/08/2019    Time  4    Period  Weeks    Status  Achieved    Target Date  10/06/19      PT SHORT TERM GOAL #2   Title  Pt will improve TUG to </= 60 seconds with LRAD and supervision    Baseline  MET 10/08/2019  TUG with RW 29.69sec    Time  4    Period  Weeks    Status  Achieved    Target Date  10/06/19      PT SHORT TERM GOAL #3   Title  Pt will improve standing balance as indicated by 4 point improvement in BERG    Baseline  MET 10/08/2019  Berg 20/56 from 16/56    Time  4    Period  Weeks    Status  Achieved    Target Date  10/06/19      PT SHORT TERM GOAL #4   Title  Pt will negotiate 4 stairs with two rails with supervision and decreased use of UE to pull self up stairs    Baseline  MET 10/08/2019    Time  4    Period  Weeks    Status  Achieved    Target Date  10/06/19      PT SHORT TERM GOAL #5   Title  Pt  will ambulate x 115' with LRAD and supervision over indoor, level surfaces to indicate improved endurance    Baseline  MET 10/08/2019 with RW    Time  4    Period  Weeks    Status  Achieved    Target Date  10/06/19        PT Long Term  Goals - 10/08/19 1854      PT LONG TERM GOAL #1   Title  Pt will demonstrate independence with final LE strength and balance HEP  (11/05/2019 due date for all LTG)    Time  8    Period  Weeks    Status  On-going    Target Date  11/05/19      PT LONG TERM GOAL #2   Title  Pt will demonstrate decreased falls risk as indicated by decrease in TUG to </= 45 seconds with LRAD    Time  8    Period  Weeks    Status  On-going    Target Date  11/05/19      PT LONG TERM GOAL #3   Title  Pt will improve BERG balance to >/= 24/56    Time  8    Period  Weeks    Status  On-going    Target Date  11/05/19      PT LONG TERM GOAL #4   Title  Pt will ambulate x 230' over indoor, level surfaces with LRAD, supervision and increased upright trunk posture    Time  8    Period  Weeks    Status  On-going    Target Date  11/05/19      PT LONG TERM GOAL #5   Title  Pt will improve distance on 2 minute walk test by 40' while maintaining RPE at 12-13    Baseline  141'    Time  8    Period  Weeks    Status  On-going    Target Date  11/05/19            Plan - 10/08/19 1855    Clinical Impression Statement  Patient met all STGs. He is progressing but still has significant deficits limiting his mobility & impairing his balance. [place] balance score of 20/56 indicates high fall risk and dependency in standing ADLs. But a 4 point increase is working towards minimal clinical significant change of 8 points per research.    Personal Factors and Comorbidities  Behavior Pattern;Comorbidity 3+;Fitness;Past/Current Experience;Social Background    Comorbidities  peripheral neuropathy, peripheral edema due to chronic venous stasis, pancreatitis due to biliary obstruction, OSA on CPAP, obesity, HTN, hydrocephalus, melanoma, chronic low back pain, lumbar surgery, bilat carpal tunnel release, endovenous ablation saphenous vein with laser, insomnia, sepsis due to LE cellulitis, chronic diastolic CHF, cervicalgia and  multiple falls    Examination-Activity Limitations  Bend;Locomotion Level;Stairs;Stand;Transfers    Examination-Participation Restrictions  Driving    Stability/Clinical Decision Making  Evolving/Moderate complexity    Rehab Potential  Fair    PT Frequency  2x / week    PT Duration  8 weeks    PT Treatment/Interventions  ADLs/Self Care Home Management;Cryotherapy;Electrical Stimulation;Moist Heat;DME Instruction;Gait training;Stair training;Functional mobility training;Therapeutic activities;Therapeutic exercise;Balance training;Neuromuscular re-education;Patient/family education;Orthotic Fit/Training;Passive range of motion    PT Next Visit Plan  work towards [place].  Warm up on SCI fit.  add to HEP as appropriate - standing balance and LE strengthening.  Hip flexor stretch?    [place]  Consulted and Agree with Plan of Care  Patient       Patient will benefit from skilled therapeutic intervention in order to improve the following deficits and impairments:  Abnormal gait, Cardiopulmonary status limiting activity, Decreased activity tolerance, Decreased balance, Decreased endurance, Decreased strength, Difficulty walking, Impaired sensation, Postural dysfunction, Pain, Decreased range of motion  Visit Diagnosis: Other abnormalities of gait and mobility  Unsteadiness on feet  Abnormal posture  Repeated falls  Muscle weakness     Problem List Patient Active Problem List   Diagnosis Date Noted  . Hypotension   . Fall   . Severe sepsis ([place]) 03/14/2019  . Varicose veins of bilateral lower extremities with other complications 67/34/1937  . Cellulitis of lower extremity   . Sepsis due to cellulitis ([place]) 03/21/2017  . Essential hypertension 03/21/2017  . Chronic diastolic CHF (congestive heart failure) ([place]) 03/21/2017  . Renal insufficiency 03/21/2017  . Meralgia paresthetica 10/12/2013  . Polyneuropathy in other diseases classified elsewhere ([place])  11/22/2012  . Obstructive sleep apnea (adult) (pediatric) 11/22/2012  . Abnormality of gait 11/22/2012  . Headache(784.0) 11/22/2012  . Pain in limb 11/22/2012  . Lumbago 11/22/2012  . Cervicalgia 11/22/2012  . Spinal stenosis in cervical region 11/22/2012  . Degeneration of intervertebral disc, site unspecified 11/22/2012  . Degeneration of cervical intervertebral disc 11/22/2012  . Obstructive hydrocephalus ([place]) 11/22/2012    Jamey Reas PT, DPT 10/09/2019, 2:23 PM  [facility] [address] [place], [place], [address] Phone: [phone]   Fax:  [phone]  Name: [name] MRN: [id number] Date of Birth: 07-Jan-1956

## 2019-10-11 ENCOUNTER — Other Ambulatory Visit: Payer: Self-pay

## 2019-10-11 ENCOUNTER — Ambulatory Visit: Payer: Medicare Other | Admitting: Physical Therapy

## 2019-10-11 DIAGNOSIS — R293 Abnormal posture: Secondary | ICD-10-CM

## 2019-10-11 DIAGNOSIS — R2689 Other abnormalities of gait and mobility: Secondary | ICD-10-CM

## 2019-10-11 DIAGNOSIS — M6281 Muscle weakness (generalized): Secondary | ICD-10-CM

## 2019-10-11 DIAGNOSIS — R2681 Unsteadiness on feet: Secondary | ICD-10-CM

## 2019-10-11 DIAGNOSIS — R296 Repeated falls: Secondary | ICD-10-CM

## 2019-10-11 NOTE — Therapy (Signed)
[facility] [address] [place], [place], [address] Phone: [phone]   Fax:  [phone]  Physical Therapy Treatment  Patient Details  Name: [name] MRN: [id number] Date of Birth: May 04, 1956 Referring Provider (PT): Kathrynn Ducking, MD   Encounter Date: 10/11/2019  PT End of Session - 10/11/19 1250    Visit Number  7    Number of Visits  17    Date for PT Re-Evaluation  11/05/19    Authorization Type  [employer] Medicare; $35 copay.  10th visit PN    PT Start Time  1139   arrived late   PT Stop Time  1225    PT Time Calculation (min)  46 min    Activity Tolerance  Patient tolerated treatment well    Behavior During Therapy  WFL for tasks assessed/performed   verbose, needs frequent re-direction      Past Medical History:  Diagnosis Date  . Cataracts, bilateral   . Chronic insomnia   . Chronic low back pain   . Gait disorder   . History of headache   . History of melanoma   . Hydrocephalus ([place])   . Hypertension   . Obesity   . Obstructive sleep apnea on CPAP   . Pancreatitis    Secondary to gallstones  . Pancreatitis due to biliary obstruction   . Peripheral edema    Chronic venous stasis  . Peripheral neuropathy     Past Surgical History:  Procedure Laterality Date  . CARPAL TUNNEL RELEASE Bilateral   . CHOLECYSTECTOMY    . ENDOVENOUS ABLATION SAPHENOUS VEIN W/ LASER Left 06/01/2017   endovenous laser ablation left greater saphenous vein by Tinnie Gens MD   . ENDOVENOUS ABLATION SAPHENOUS VEIN W/ LASER Right 06/27/2017   endovenous laser ablation right GSV  by Tinnie Gens MD  . [place]    . LUMBAR SPINE SURGERY    . TONSILLECTOMY      There were no vitals filed for this visit.  Subjective Assessment - 10/11/19 1154    Subjective  "She gave me a book of exercises to work on at home!"  In a lot of pain today due to the weather - took 2 pain pills just to come today.  Has an  appointment with [facility] on 1/5.    Pertinent History  peripheral neuropathy, peripheral edema due to chronic venous stasis, pancreatitis due to biliary obstruction, OSA on CPAP, obesity, HTN, hydrocephalus, melanoma, chronic low back pain, lumbar surgery, bilat carpal tunnel release, endovenous ablation saphenous vein with laser, insomnia, sepsis due to LE cellulitis, chronic diastolic CHF, cervicalgia and multiple falls    Diagnostic tests  CT scan - 1. Marked enlargement of the third and lateral ventricles with effacement of the sulci consistent with obstructive hydrocephalus.  There is no change compared to the 03/14/2019 CT scan.  Older CT scans were not available for comparison and 2.   No acute findings    Patient Stated Goals  "take 20 years off my life" - walking without a cane, RW or w/c.    Pain Onset  More than a month ago                       [facility] Adult PT Treatment/Exercise - 10/11/19 1156      Transfers   Transfers  Sit to Stand    Sit to Stand  4: Min assist  Sit to Stand Details (indicate cue type and reason)  tactile and verbal cues for full anterior lean and weight shift over BOS; pt required multiple repetitions to perform successful sit > stand from chair with arm rests; reporting significant back pain upon standing      Ambulation/Gait   Ambulation/Gait  Yes    Ambulation/Gait Assistance  5: Supervision    Ambulation/Gait Assistance Details  replaced tennis balls on bottom of RW legs.  When ambulating provided cues to patient to keep gaze 3-6 feet in front of him instead of straight down at feet, for increased hip extension to bring trunk more upright and to decrease shoulder elevation through active shoulder depression to decrease pressure and strain on shoulders.  Pt only able to maintain gaze and shoulder depression x 10' until he returned to increased trunk flexion and shoulder elevation    Ambulation Distance (Feet)  115 Feet     Assistive device  Rolling walker    Gait Pattern  Step-through pattern;Decreased step length - right;Decreased step length - left;Decreased stride length;Trunk flexed;Wide base of support    Ambulation Surface  Level;Indoor      Balance   Balance Assessed  Yes      High Level Balance   High Level Balance Activities  Other (comment)    High Level Balance Comments  Standing at counter top for bilat UE support: performed 8-10 reps anterior/posterior weight shifting utilizing hip strategy and then with R/L staggered stance diagonal weight shifting, R/L lateral weight shifting.  Required 2 seated rest breaks due to low back pain.  99% 02      Knee/Hip Exercises: Aerobic   Stepper  SCI Fit at level 1.0 x 6 minutes with bilat UE and LE with intermittent assistance to reposition L foot on pedal.  02 98% after 6 minutes               PT Short Term Goals - 10/08/19 1849      PT SHORT TERM GOAL #1   Title  Pt will initiate standing balance, endurance and LE strengthening HEP    Baseline  MET 10/08/2019    Time  4    Period  Weeks    Status  Achieved    Target Date  10/06/19      PT SHORT TERM GOAL #2   Title  Pt will improve TUG to </= 60 seconds with LRAD and supervision    Baseline  MET 10/08/2019  TUG with RW 29.69sec    Time  4    Period  Weeks    Status  Achieved    Target Date  10/06/19      PT SHORT TERM GOAL #3   Title  Pt will improve standing balance as indicated by 4 point improvement in BERG    Baseline  MET 10/08/2019  Berg 20/56 from 16/56    Time  4    Period  Weeks    Status  Achieved    Target Date  10/06/19      PT SHORT TERM GOAL #4   Title  Pt will negotiate 4 stairs with two rails with supervision and decreased use of UE to pull self up stairs    Baseline  MET 10/08/2019    Time  4    Period  Weeks    Status  Achieved    Target Date  10/06/19      PT SHORT TERM GOAL #5   Title  Pt  will ambulate x 115' with LRAD and supervision over indoor, level  surfaces to indicate improved endurance    Baseline  MET 10/08/2019 with RW    Time  4    Period  Weeks    Status  Achieved    Target Date  10/06/19        PT Long Term Goals - 10/08/19 1854      PT LONG TERM GOAL #1   Title  Pt will demonstrate independence with final LE strength and balance HEP  (11/05/2019 due date for all LTG)    Time  8    Period  Weeks    Status  On-going    Target Date  11/05/19      PT LONG TERM GOAL #2   Title  Pt will demonstrate decreased falls risk as indicated by decrease in TUG to </= 45 seconds with LRAD    Time  8    Period  Weeks    Status  On-going    Target Date  11/05/19      PT LONG TERM GOAL #3   Title  Pt will improve BERG balance to >/= 24/56    Time  8    Period  Weeks    Status  On-going    Target Date  11/05/19      PT LONG TERM GOAL #4   Title  Pt will ambulate x 230' over indoor, level surfaces with LRAD, supervision and increased upright trunk posture    Time  8    Period  Weeks    Status  On-going    Target Date  11/05/19      PT LONG TERM GOAL #5   Title  Pt will improve distance on 2 minute walk test by 40' while maintaining RPE at 12-13    Baseline  141'    Time  8    Period  Weeks    Status  On-going    Target Date  11/05/19            Plan - 10/11/19 1250    Clinical Impression Statement  Treatment session continued to focus on aerobic conditioning, LE strengthening, sequencing and technique for more efficient sit > stand, balance, weight shifting and gait training with decreased support/pressure through UE.  Pt required multiple seated rest breaks today due to back pain but was able to continue after a few minutes of resting.  Will continue to progress towards LTG.    Personal Factors and Comorbidities  Behavior Pattern;Comorbidity 3+;Fitness;Past/Current Experience;Social Background    Comorbidities  peripheral neuropathy, peripheral edema due to chronic venous stasis, pancreatitis due to biliary  obstruction, OSA on CPAP, obesity, HTN, hydrocephalus, melanoma, chronic low back pain, lumbar surgery, bilat carpal tunnel release, endovenous ablation saphenous vein with laser, insomnia, sepsis due to LE cellulitis, chronic diastolic CHF, cervicalgia and multiple falls    Examination-Activity Limitations  Bend;Locomotion Level;Stairs;Stand;Transfers    Examination-Participation Restrictions  Driving    Stability/Clinical Decision Making  Evolving/Moderate complexity    Rehab Potential  Fair    PT Frequency  2x / week    PT Duration  8 weeks    PT Treatment/Interventions  ADLs/Self Care Home Management;Cryotherapy;Electrical Stimulation;Moist Heat;DME Instruction;Gait training;Stair training;Functional mobility training;Therapeutic activities;Therapeutic exercise;Balance training;Neuromuscular re-education;Patient/family education;Orthotic Fit/Training;Passive range of motion    PT Next Visit Plan  work towards [place].  Warm up on SCI fit.  add to HEP as appropriate - standing balance and LE strengthening.  Weight shifting  and balance reaction training; gait with more upright trunk and gaze - decreased pressure through UE    PT [place] and Agree with Plan of Care  Patient       Patient will benefit from skilled therapeutic intervention in order to improve the following deficits and impairments:  Abnormal gait, Cardiopulmonary status limiting activity, Decreased activity tolerance, Decreased balance, Decreased endurance, Decreased strength, Difficulty walking, Impaired sensation, Postural dysfunction, Pain, Decreased range of motion  Visit Diagnosis: Unsteadiness on feet  Other abnormalities of gait and mobility  Abnormal posture  Repeated falls  Muscle weakness (generalized)     Problem List Patient Active Problem List   Diagnosis Date Noted  . Hypotension   . Fall   . Severe sepsis ([place]) 03/14/2019  . Varicose veins of bilateral lower extremities  with other complications 43/53/9122  . Cellulitis of lower extremity   . Sepsis due to cellulitis ([place]) 03/21/2017  . Essential hypertension 03/21/2017  . Chronic diastolic CHF (congestive heart failure) ([place]) 03/21/2017  . Renal insufficiency 03/21/2017  . Meralgia paresthetica 10/12/2013  . Polyneuropathy in other diseases classified elsewhere ([place]) 11/22/2012  . Obstructive sleep apnea (adult) (pediatric) 11/22/2012  . Abnormality of gait 11/22/2012  . Headache(784.0) 11/22/2012  . Pain in limb 11/22/2012  . Lumbago 11/22/2012  . Cervicalgia 11/22/2012  . Spinal stenosis in cervical region 11/22/2012  . Degeneration of intervertebral disc, site unspecified 11/22/2012  . Degeneration of cervical intervertebral disc 11/22/2012  . Obstructive hydrocephalus ([place]) 11/22/2012    Rico Junker, PT, DPT 10/11/19    12:56 PM    [facility] [address] [place], [place], [address] Phone: [phone]   Fax:  [phone]  Name: [name] MRN: [id number] Date of Birth: September 20, 1956

## 2019-10-15 ENCOUNTER — Other Ambulatory Visit: Payer: Self-pay | Admitting: Neurology

## 2019-10-17 ENCOUNTER — Ambulatory Visit: Payer: Medicare Other

## 2019-10-21 NOTE — Progress Notes (Signed)
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  He has history of hydrocephalus, but was felt that he was not a candidate for a VP shunt.  After his last visit he was sent for physical therapy for gait training.  Repeat CT scan of the brain in November 2019 was unchanged from 6 months prior.  He was sent for neurosurgery evaluation per patient request.  Apparently he saw Dr. Newell Coral, was was told he needed to go to [facility] for evaluation.  He is scheduled to see Dr.Tatter tomorrow at [facility].  He denies any changes to his overall condition in the last 6 months.  He remains in physical therapy, reports this has been helpful, it is getting easier to get out of chairs.  He says he has had a few falls, has been bruised, but otherwise not injured.  He reports for the last 6 months he may have a pounding headache to the top of his head.  He lives alone, drives a car without difficulty.  He does use a walker full-time.  His initial systolic blood pressure today was in the 70s, repeat was 100/70, was not symptomatic.  He has a machine that checks his blood pressure every morning, reports it was 124/60 today. He presents today for evaluation unaccompanied.  HISTORY 08/16/2019 Dr. Anne Hahn: Mr. 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  The patient has hydrocephalus with enlargement of the lateral ventricles and third ventricle with a normal sized fourth ventricle.  The patient was seen by Dr. 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  The patient has done relatively well with his ability to ambulate, he has been walking with a cane.  Within the last 6 to  8 months however, he believes that there has been a change in his ability to ambulate.  He has been falling more, he is having increasing difficulty with balance.  He underwent a CT scan of the brain in May 2020 showing evidence of the hydrocephalus with some effacement of the cortex.  The patient returns the office today for further evaluation.  The patient denies any headaches or dizziness, he denies issues controlling the bowels or the bladder.  He does note some mild memory troubles but this has not been a significant change in his clinical condition.  REVIEW OF SYSTEMS: Out of a complete 14 system review of symptoms, the patient complains only of the following symptoms, and all other reviewed systems are negative.  Gait abnormality  ALLERGIES: Allergies  Allergen Reactions  . Vancomycin     Arm began swelling and itching into infusion 5/27, no respiratory symptoms    HOME MEDICATIONS: Outpatient Medications Prior to Visit  Medication Sig Dispense Refill  . aspirin 81 MG tablet Take 81 mg by mouth daily.    . furosemide (LASIX) 40 MG tablet Take 40 mg by mouth 2 (two) times daily.     Marland Kitchen gabapentin (NEURONTIN) 300 MG capsule Take 1 capsule (300 mg total) by mouth 3 (three) times daily. (Patient taking differently: Take 900 mg by mouth 4 (four) times daily. ) 270 capsule 3  . hydrocerin (EUCERIN)  CREA Apply 1 application topically daily. 113 g 0  . HYDROcodone-acetaminophen (NORCO) 7.5-325 MG tablet TAKE 1 TABLET BY MOUTH EVERY 6 HOURS AS NEEDED FOR MODERATE PAIN (MUST LAST 28 DAYS). 40 tablet 0  . mupirocin cream (BACTROBAN) 2 % Apply topically 2 (two) times daily. 15 g 0  . nortriptyline (PAMELOR) 10 MG capsule TAKE 3 CAPSULES (30 MG TOTAL) BY MOUTH EVERY EVENING. (Patient taking differently: Take 30 mg by mouth at bedtime. ) 270 capsule 1  . ramipril (ALTACE) 10 MG capsule Take 10 mg by mouth 2 (two) times daily.    Marland Kitchen tiZANidine (ZANAFLEX) 4 MG tablet TAKE 1 TABLET BY MOUTH 3   TIMES DAILY 270 tablet 0  . traZODone (DESYREL) 50 MG tablet Take 1 tablet by mouth at bedtime.    Marland Kitchen atorvastatin (LIPITOR) 20 MG tablet Take 20 mg by mouth daily.     . ropinirole (REQUIP) 5 MG tablet Take 5 mg by mouth daily.      No facility-administered medications prior to visit.    PAST MEDICAL HISTORY: Past Medical History:  Diagnosis Date  . Cataracts, bilateral   . Chronic insomnia   . Chronic low back pain   . Gait disorder   . History of headache   . History of melanoma   . Hydrocephalus (HCC)   . Hypertension   . Obesity   . Obstructive sleep apnea on CPAP   . Pancreatitis    Secondary to gallstones  . Pancreatitis due to biliary obstruction   . Peripheral edema    Chronic venous stasis  . Peripheral neuropathy     PAST SURGICAL HISTORY: Past Surgical History:  Procedure Laterality Date  . CARPAL TUNNEL RELEASE Bilateral   . CHOLECYSTECTOMY    . ENDOVENOUS ABLATION SAPHENOUS VEIN W/ LASER Left 06/01/2017   endovenous laser ablation left greater saphenous vein by Josephina Gip MD   . ENDOVENOUS ABLATION SAPHENOUS VEIN W/ LASER Right 06/27/2017   endovenous laser ablation right GSV  by Josephina Gip MD  . INGUINAL HERNIA REPAIR    . LUMBAR SPINE SURGERY    . TONSILLECTOMY      FAMILY HISTORY: Family History  Problem Relation Age of Onset  . Cancer Mother        Liver cancer  . Cancer Father        brain cancer  . Cancer Maternal Grandmother        Liver cancer  . Heart attack Maternal Grandfather     SOCIAL HISTORY: Social History   Socioeconomic History  . Marital status: Married    Spouse name: Steward Drone  . Number of children: 2  . Years of education: 25  . Highest education level: Not on file  Occupational History    Comment: Employed by school system  Tobacco Use  . Smoking status: Never Smoker  . Smokeless tobacco: Never Used  Substance and Sexual Activity  . Alcohol use: Yes    Alcohol/week: 0.0 standard drinks    Comment: Consumes  alcohol on occasion  . Drug use: No  . Sexual activity: Not on file  Other Topics Concern  . Not on file  Social History Narrative   Patient lives at home.  Widowed since 09/18/2016. Wife had colon cancer.      Patient is retired/disabled.   Education college.   Both hands.   Caffeine four cups of coffee daily.   Social Determinants of Health   Financial Resource Strain:   . Difficulty of Paying  Living Expenses: Not on file  Food Insecurity:   . Worried About Programme researcher, broadcasting/film/video in the Last Year: Not on file  . Ran Out of Food in the Last Year: Not on file  Transportation Needs:   . Lack of Transportation (Medical): Not on file  . Lack of Transportation (Non-Medical): Not on file  Physical Activity:   . Days of Exercise per Week: Not on file  . Minutes of Exercise per Session: Not on file  Stress:   . Feeling of Stress : Not on file  Social Connections:   . Frequency of Communication with Friends and Family: Not on file  . Frequency of Social Gatherings with Friends and Family: Not on file  . Attends Religious Services: Not on file  . Active Member of Clubs or Organizations: Not on file  . Attends Banker Meetings: Not on file  . Marital Status: Not on file  Intimate Partner Violence:   . Fear of Current or Ex-Partner: Not on file  . Emotionally Abused: Not on file  . Physically Abused: Not on file  . Sexually Abused: Not on file   PHYSICAL EXAM  Vitals:   10/22/19 1045 10/22/19 1051  BP: (!) 78/48 100/70  Pulse: 91   Temp: 97.6 F (36.4 C)   Weight: (!) 329 lb 3.2 oz (149.3 kg)   Height: 6\' 1"  (1.854 m)    Body mass index is 43.43 kg/m.  Generalized: Well developed, in no acute distress, obese  Neurological examination  Mentation: Alert oriented to time, place, history taking. Follows all commands speech and language fluent Cranial nerve II-XII: Pupils were equal round reactive to light. Extraocular movements were full, visual field were full on  confrontational test. Facial sensation and strength were normal.  Head turning and shoulder shrug  were normal and symmetric. Motor: Good strength in all extremities. Sensory: Sensory testing is intact to soft touch on all 4 extremities. No evidence of extinction is noted.  Coordination: Cerebellar testing reveals good finger-nose-finger and heel-to-shin bilaterally.  Gait and station: Prolonged rocking motion to stand upright, uses a walker to ambulate, gait is wide-based, he was able to use the stool to sit on the exam table, tandem gait and Romberg were not performed. Reflexes: Deep tendon reflexes are symmetric   DIAGNOSTIC DATA (LABS, IMAGING, TESTING) - I reviewed patient records, labs, notes, testing and imaging myself where available.  Lab Results  Component Value Date   WBC 10.2 03/16/2019   HGB 13.0 03/16/2019   HCT 39.5 03/16/2019   MCV 95.9 03/16/2019   PLT 246 03/16/2019      Component Value Date/Time   NA 135 03/16/2019 0509   K 3.2 (L) 03/16/2019 0509   CL 99 03/16/2019 0509   CO2 26 03/16/2019 0509   GLUCOSE 124 (H) 03/16/2019 0509   BUN 9 03/16/2019 0509   CREATININE 0.66 03/16/2019 0509   CALCIUM 8.2 (L) 03/16/2019 0509   PROT 7.0 03/14/2019 1230   ALBUMIN 2.8 (L) 03/14/2019 1230   AST 15 03/14/2019 1230   ALT 14 03/14/2019 1230   ALKPHOS 45 03/14/2019 1230   BILITOT 0.8 03/14/2019 1230   GFRNONAA >60 03/16/2019 0509   GFRAA >60 03/16/2019 0509   Lab Results  Component Value Date   CHOL 90 03/15/2019   HDL 25 (L) 03/15/2019   LDLCALC 56 03/15/2019   TRIG 45 03/15/2019   CHOLHDL 3.6 03/15/2019   Lab Results  Component Value Date   HGBA1C 5.5  03/15/2019   No results found for: VITAMINB12 Lab Results  Component Value Date   TSH 2.536 03/15/2019    ASSESSMENT AND PLAN 64 y.o. year old male  has a past medical history of Cataracts, bilateral, Chronic insomnia, Chronic low back pain, Gait disorder, History of headache, History of melanoma,  Hydrocephalus ([place]), Hypertension, Obesity, Obstructive sleep apnea on CPAP, Pancreatitis, Pancreatitis due to biliary obstruction, Peripheral edema, and Peripheral neuropathy. here with:  1.  Peripheral neuropathy 2.  Hydrocephalus 3.  Gait disturbance  Since last seen, he feels his overall condition has remained stable for the last 6 months in regards to his worsening balance and gait instability.  He now uses a walker full-time, he remains in physical therapy, which has been beneficial.  Fortunately, he has his initial evaluation with Dr. Salomon Fick tomorrow.  Repeat CT scan of the brain in November 2020 was unchanged from 6 months prior.  His initial systolic blood pressure was low today in the 70s, while being asymptomatic, repeat was 100/70.  He should monitor his blood pressures at home, if BP continues to remain low, hold evening dose of BP medication, contact PCP if ongoing issue, again asymptomatic, no new symptoms or change in condition (did review CT scan with Dr. Krista Blue, to ensure no neurological cause for low BP).  He will follow-up in 4 months or sooner if needed.  I did advise if his symptoms worsen or if he develops any new symptoms he should let us know.  CT scan of the brain 08/31/2019 IMPRESSION: This CT scan of the head without contrast shows the following: 1.   Marked enlargement of the third and lateral ventricles with effacement of the sulci consistent with obstructive hydrocephalus.  There is no change compared to the 03/14/2019 CT scan.  Older CT scans were not available for comparison. 2.   No acute findings.  I spent 15 minutes with the patient. 50% of this time was spent discussing his plan of care.  Butler Denmark, AGNP-C, DNP 10/22/2019, 12:22 PM [facility] [address], [place], [place] [address] [phone]

## 2019-10-22 ENCOUNTER — Other Ambulatory Visit: Payer: Self-pay

## 2019-10-22 ENCOUNTER — Encounter: Payer: Self-pay | Admitting: Neurology

## 2019-10-22 ENCOUNTER — Ambulatory Visit (INDEPENDENT_AMBULATORY_CARE_PROVIDER_SITE_OTHER): Payer: Medicare Other | Admitting: Neurology

## 2019-10-22 VITALS — BP 100/70 | HR 91 | Temp 97.6°F | Ht 73.0 in | Wt 329.2 lb

## 2019-10-22 DIAGNOSIS — R269 Unspecified abnormalities of gait and mobility: Secondary | ICD-10-CM | POA: Diagnosis not present

## 2019-10-22 DIAGNOSIS — G911 Obstructive hydrocephalus: Secondary | ICD-10-CM

## 2019-10-22 NOTE — Patient Instructions (Signed)
Please keep an eye on your BP at home, for today hold your evening dose of BP medication. Please keep your appointment tomorrow with Dr. Angelyn Punt.

## 2019-10-23 ENCOUNTER — Ambulatory Visit: Payer: Medicare Other | Admitting: Physical Therapy

## 2019-10-23 NOTE — Progress Notes (Signed)
I have read the note, and I agree with the clinical assessment and plan.  Jaimen Melone K Larose Batres   

## 2019-10-24 ENCOUNTER — Telehealth: Payer: Self-pay | Admitting: Neurology

## 2019-10-24 NOTE — Telephone Encounter (Signed)
Pt called wanting to inform the provider that he has seen Dr Angelyn Punt at [facility] and his surgery will be scheduled some time this month. Please advise.

## 2019-10-24 NOTE — Telephone Encounter (Signed)
Called pt. He stated that after seeing Dr. Angelyn Punt.  He recommended having procedure done, to improve his gait, walking since he is home alone.  Hopefully see 50% improvement.  He signed OR consent to move forward. No date yet.  (3 day 2 night stay).

## 2019-10-24 NOTE — Telephone Encounter (Signed)
That is excellent. I will forward this message to Dr. Anne Hahn so he stays in the loop as well. I can't see any records on Care Everywhere yet.   CT scan of the brain 08/31/2019 IMPRESSION: This CT scan of the head without contrast shows the following: 1. Marked enlargement of the third and lateral ventricles with effacement of the sulci consistent with obstructive hydrocephalus. There is no change compared to the 03/14/2019 CT scan. Older CT scans were not available for comparison. 2. No acute findings.

## 2019-10-25 ENCOUNTER — Encounter: Payer: Self-pay | Admitting: Physical Therapy

## 2019-10-25 ENCOUNTER — Ambulatory Visit: Payer: Medicare Other | Attending: Neurology | Admitting: Physical Therapy

## 2019-10-25 ENCOUNTER — Other Ambulatory Visit: Payer: Self-pay

## 2019-10-25 DIAGNOSIS — R296 Repeated falls: Secondary | ICD-10-CM | POA: Diagnosis present

## 2019-10-25 DIAGNOSIS — R2689 Other abnormalities of gait and mobility: Secondary | ICD-10-CM | POA: Insufficient documentation

## 2019-10-25 DIAGNOSIS — R293 Abnormal posture: Secondary | ICD-10-CM

## 2019-10-25 DIAGNOSIS — R2681 Unsteadiness on feet: Secondary | ICD-10-CM | POA: Diagnosis present

## 2019-10-25 DIAGNOSIS — M6281 Muscle weakness (generalized): Secondary | ICD-10-CM | POA: Insufficient documentation

## 2019-10-25 NOTE — Therapy (Signed)
[facility] [address] [place], [place], [address] Phone: [phone]   Fax:  [phone]  Physical Therapy Treatment  Patient Details  Name: [name] MRN: [id number] Date of Birth: 14-May-1956 Referring Provider (PT): Kathrynn Ducking, MD   Encounter Date: 10/25/2019  PT End of Session - 10/25/19 1721    Visit Number  8    Number of Visits  17    Date for PT Re-Evaluation  11/05/19    Authorization Type  [employer] Medicare; $35 copay.  10th visit PN    PT Start Time  1113    PT Stop Time  1200    PT Time Calculation (min)  47 min    Activity Tolerance  Patient tolerated treatment well    Behavior During Therapy  WFL for tasks assessed/performed   verbose, needs frequent re-direction      Past Medical History:  Diagnosis Date  . Cataracts, bilateral   . Chronic insomnia   . Chronic low back pain   . Gait disorder   . History of headache   . History of melanoma   . Hydrocephalus ([place])   . Hypertension   . Obesity   . Obstructive sleep apnea on CPAP   . Pancreatitis    Secondary to gallstones  . Pancreatitis due to biliary obstruction   . Peripheral edema    Chronic venous stasis  . Peripheral neuropathy     Past Surgical History:  Procedure Laterality Date  . CARPAL TUNNEL RELEASE Bilateral   . CHOLECYSTECTOMY    . ENDOVENOUS ABLATION SAPHENOUS VEIN W/ LASER Left 06/01/2017   endovenous laser ablation left greater saphenous vein by Tinnie Gens MD   . ENDOVENOUS ABLATION SAPHENOUS VEIN W/ LASER Right 06/27/2017   endovenous laser ablation right GSV  by Tinnie Gens MD  . [place]    . LUMBAR SPINE SURGERY    . TONSILLECTOMY      There were no vitals filed for this visit.  Subjective Assessment - 10/25/19 1123    Subjective  Had a fall during Christmas outside, fell back into mud.  Crawled to the driveway and son helped him get to standing; no injury.  Had consult with neurosurgery at  [facility] - recommending shunt placement.  No date yet.    Pertinent History  peripheral neuropathy, peripheral edema due to chronic venous stasis, pancreatitis due to biliary obstruction, OSA on CPAP, obesity, HTN, hydrocephalus, melanoma, chronic low back pain, lumbar surgery, bilat carpal tunnel release, endovenous ablation saphenous vein with laser, insomnia, sepsis due to LE cellulitis, chronic diastolic CHF, cervicalgia and multiple falls    Diagnostic tests  CT scan - 1. Marked enlargement of the third and lateral ventricles with effacement of the sulci consistent with obstructive hydrocephalus.  There is no change compared to the 03/14/2019 CT scan.  Older CT scans were not available for comparison and 2.   No acute findings    Patient Stated Goals  "take 20 years off my life" - walking without a cane, RW or w/c.    Pain Onset  More than a month ago                       [facility] Adult PT Treatment/Exercise - 10/25/19 1127      Knee/Hip Exercises: Aerobic   Stepper  Sci Fit at level 1 x 6 minutes with bilat UE and LE with improved LE ROM and tolerance  to aerobic exercise today.  Only minimal pain in medial R knee today and intermittent cues to internally rotate R hip to keep knee alignment.  No assistance needed to reposition L foot today.      Knee/Hip Exercises: Standing   Hip Flexion  Stengthening;Right;Left;1 set;10 reps;Knee bent    Hip Flexion Limitations  against resistance of red theraband, UE support on RW    Hip Extension  Stengthening;Right;Left;1 set;10 reps;Knee straight    Extension Limitations  against resistance of red theraband, UE support on RW             PT Education - 10/25/19 1720    Education Details  Once pt scheduled for shunt placement PT will D/C until pt is cleared to return to outpatient therapy    Person(s) Educated  Patient    Methods  Explanation    Comprehension  Verbalized understanding       PT Short Term Goals - 10/08/19 1849       PT SHORT TERM GOAL #1   Title  Pt will initiate standing balance, endurance and LE strengthening HEP    Baseline  MET 10/08/2019    Time  4    Period  Weeks    Status  Achieved    Target Date  10/06/19      PT SHORT TERM GOAL #2   Title  Pt will improve TUG to </= 60 seconds with LRAD and supervision    Baseline  MET 10/08/2019  TUG with RW 29.69sec    Time  4    Period  Weeks    Status  Achieved    Target Date  10/06/19      PT SHORT TERM GOAL #3   Title  Pt will improve standing balance as indicated by 4 point improvement in BERG    Baseline  MET 10/08/2019  [place]erg 20/56 from 16/56    Time  4    Period  Weeks    Status  Achieved    Target Date  10/06/19      PT SHORT TERM GOAL #4   Title  Pt will negotiate 4 stairs with two rails with supervision and decreased use of UE to pull self up stairs    Baseline  MET 10/08/2019    Time  4    Period  Weeks    Status  Achieved    Target Date  10/06/19      PT SHORT TERM GOAL #5   Title  Pt will ambulate x 115' with LRAD and supervision over indoor, level surfaces to indicate improved endurance    Baseline  MET 10/08/2019 with RW    Time  4    Period  Weeks    Status  Achieved    Target Date  10/06/19        PT Long Term Goals - 10/08/19 1854      PT LONG TERM GOAL #1   Title  Pt will demonstrate independence with final LE strength and balance HEP  (11/05/2019 due date for all LTG)    Time  8    Period  Weeks    Status  On-going    Target Date  11/05/19      PT LONG TERM GOAL #2   Title  Pt will demonstrate decreased falls risk as indicated by decrease in TUG to </= 45 seconds with LRAD    Time  8    Period  Weeks    Status  On-going    Target Date  11/05/19      PT LONG TERM GOAL #3   Title  Pt will improve BERG balance to >/= 24/56    Time  8    Period  Weeks    Status  On-going    Target Date  11/05/19      PT LONG TERM GOAL #4   Title  Pt will ambulate x 230' over indoor, level surfaces with LRAD,  supervision and increased upright trunk posture    Time  8    Period  Weeks    Status  On-going    Target Date  11/05/19      PT LONG TERM GOAL #5   Title  Pt will improve distance on 2 minute walk test by 40' while maintaining RPE at 12-13    Baseline  141'    Time  8    Period  Weeks    Status  On-going    Target Date  11/05/19            Plan - 10/25/19 1721    Clinical Impression Statement  Continued to perform aerobic conditioning with SCI FIT stepper with pt demonstrating improved tolerance today and decreased pain.  Pt also demonstrating decreased shuffling of feet during gait.  Added theraband resistance to LE exercises in standing to continue to progress strengthening and balance training.  Pt required two sitting rest breaks when performing.  Will continue to progress towards LTG or until pt is scheduled for shunt placement surgery.    Personal Factors and Comorbidities  Behavior Pattern;Comorbidity 3+;Fitness;Past/Current Experience;Social Background    Comorbidities  peripheral neuropathy, peripheral edema due to chronic venous stasis, pancreatitis due to biliary obstruction, OSA on CPAP, obesity, HTN, hydrocephalus, melanoma, chronic low back pain, lumbar surgery, bilat carpal tunnel release, endovenous ablation saphenous vein with laser, insomnia, sepsis due to LE cellulitis, chronic diastolic CHF, cervicalgia and multiple falls    Examination-Activity Limitations  Bend;Locomotion Level;Stairs;Stand;Transfers    Examination-Participation Restrictions  Driving    Stability/Clinical Decision Making  Evolving/Moderate complexity    Rehab Potential  Fair    PT Frequency  2x / week    PT Duration  8 weeks    PT Treatment/Interventions  ADLs/Self Care Home Management;Cryotherapy;Electrical Stimulation;Moist Heat;DME Instruction;Gait training;Stair training;Functional mobility training;Therapeutic activities;Therapeutic exercise;Balance training;Neuromuscular  re-education;Patient/family education;Orthotic Fit/Training;Passive range of motion    PT Next Visit Plan  Has he heard about when his shunt surgery will be?  Progress walking program?  Warm up on SCI fit.  add to HEP as appropriate -theraband resisted exercises 4 way hip - standing balance and LE strengthening.  Weight shifting and balance reaction training; gait with more upright trunk and gaze - decreased pressure through UE    PT [place] and Agree with Plan of Care  Patient       Patient will benefit from skilled therapeutic intervention in order to improve the following deficits and impairments:  Abnormal gait, Cardiopulmonary status limiting activity, Decreased activity tolerance, Decreased balance, Decreased endurance, Decreased strength, Difficulty walking, Impaired sensation, Postural dysfunction, Pain, Decreased range of motion  Visit Diagnosis: Unsteadiness on feet  Other abnormalities of gait and mobility  Abnormal posture  Repeated falls  Muscle weakness (generalized)     Problem List Patient Active Problem List   Diagnosis Date Noted  . Hypotension   . Fall   . Severe sepsis ([place]) 03/14/2019  . Varicose veins of  bilateral lower extremities with other complications 00/29/8473  . Cellulitis of lower extremity   . Sepsis due to cellulitis ([place]) 03/21/2017  . Essential hypertension 03/21/2017  . Chronic diastolic CHF (congestive heart failure) ([place]) 03/21/2017  . Renal insufficiency 03/21/2017  . Meralgia paresthetica 10/12/2013  . Polyneuropathy in other diseases classified elsewhere ([place]) 11/22/2012  . Obstructive sleep apnea (adult) (pediatric) 11/22/2012  . Abnormality of gait 11/22/2012  . Headache(784.0) 11/22/2012  . Pain in limb 11/22/2012  . Lumbago 11/22/2012  . Cervicalgia 11/22/2012  . Spinal stenosis in cervical region 11/22/2012  . Degeneration of intervertebral disc, site unspecified 11/22/2012  . Degeneration of  cervical intervertebral disc 11/22/2012  . Obstructive hydrocephalus ([place]) 11/22/2012    Rico Junker, PT, DPT 10/25/19    5:28 PM    [facility] [address] [place], [place], [address] Phone: [phone]   Fax:  [phone]  Name: AVIR DERUITER MRN: [id number] Date of Birth: 09/03/1956

## 2019-10-29 ENCOUNTER — Ambulatory Visit: Payer: Medicare Other | Admitting: Physical Therapy

## 2019-10-29 ENCOUNTER — Encounter: Payer: Self-pay | Admitting: Physical Therapy

## 2019-10-29 ENCOUNTER — Other Ambulatory Visit: Payer: Self-pay

## 2019-10-29 VITALS — HR 87

## 2019-10-29 DIAGNOSIS — R2689 Other abnormalities of gait and mobility: Secondary | ICD-10-CM

## 2019-10-29 DIAGNOSIS — R293 Abnormal posture: Secondary | ICD-10-CM

## 2019-10-29 DIAGNOSIS — R2681 Unsteadiness on feet: Secondary | ICD-10-CM

## 2019-10-29 DIAGNOSIS — M6281 Muscle weakness (generalized): Secondary | ICD-10-CM

## 2019-10-29 DIAGNOSIS — R296 Repeated falls: Secondary | ICD-10-CM

## 2019-10-29 NOTE — Therapy (Signed)
[name] [address] [place], [place], [address] Phone: [phone]   Fax:  [phone]  Physical Therapy Treatment  Patient Details  Name: [name] MRN: [id number] Date of Birth: 07/24/1956 Referring Provider (PT): Warren K [name], MD   Encounter Date: 10/29/2019  PT End of Session - 10/29/19 1115    Visit Number  9    Number of Visits  17    Date for PT Re-Evaluation  11/05/19    Authorization Type  [employer] Medicare; $35 copay.  10th visit PN    PT Start Time  1109    PT Stop Time  1157    PT Time Calculation (min)  48 min    Activity Tolerance  Patient tolerated treatment well    Behavior During Therapy  WFL for tasks assessed/performed   verbose, needs frequent re-direction      Past Medical History:  Diagnosis Date  . Cataracts, bilateral   . Chronic insomnia   . Chronic low back pain   . Gait disorder   . History of headache   . History of melanoma   . Hydrocephalus (HCC)   . Hypertension   . Obesity   . Obstructive sleep apnea on CPAP   . Pancreatitis    Secondary to gallstones  . Pancreatitis due to biliary obstruction   . Peripheral edema    Chronic venous stasis  . Peripheral neuropathy     Past Surgical History:  Procedure Laterality Date  . CARPAL TUNNEL RELEASE Bilateral   . CHOLECYSTECTOMY    . ENDOVENOUS ABLATION SAPHENOUS VEIN W/ LASER Left 06/01/2017   endovenous laser ablation left greater saphenous vein by James Lawson MD   . ENDOVENOUS ABLATION SAPHENOUS VEIN W/ LASER Right 06/27/2017   endovenous laser ablation right GSV  by James Lawson MD  . INGUINAL HERNIA REPAIR    . LUMBAR SPINE SURGERY    . TONSILLECTOMY      Vitals:   10/29/19 1202 10/29/19 1203  Pulse: 84 87  SpO2: 100% 97%    Subjective Assessment - 10/29/19 1112    Subjective  No falls since last session. Pt brought up contemplating whether or not to schedule shunt procedure - it has not been scheduled at  this time. He expressed fear of falling being his primary concern.    Pertinent History  peripheral neuropathy, peripheral edema due to chronic venous stasis, pancreatitis due to biliary obstruction, OSA on CPAP, obesity, HTN, hydrocephalus, melanoma, chronic low back pain, lumbar surgery, bilat carpal tunnel release, endovenous ablation saphenous vein with laser, insomnia, sepsis due to LE cellulitis, chronic diastolic CHF, cervicalgia and multiple falls    Diagnostic tests  CT scan - 1. Marked enlargement of the third and lateral ventricles with effacement of the sulci consistent with obstructive hydrocephalus.  There is no change compared to the 03/14/2019 CT scan.  Older CT scans were not available for comparison and 2.   No acute findings    Patient Stated Goals  "take 20 years off my life" - walking without a cane, RW or w/c.    Pain Onset  More than a month ago                       [facility] Adult PT Treatment/Exercise - 10/29/19 1120      Transfers   Transfers  Sit to Stand    Sit to Stand  3: Mod assist;4: Min assist      Sit to Stand Details  Verbal cues for sequencing;Verbal cues for technique;Verbal cues for precautions/safety;Tactile cues for weight shifting    Sit to Stand Details (indicate cue type and reason)  tactile & verbal cues for anterior wt shift & wt shift over BOS; pt required multiple attempts for sit > stand from sci-fit stepper chair & lower mat table;       Ambulation/Gait   Ambulation/Gait  Yes    Ambulation/Gait Assistance  5: Supervision    Ambulation/Gait Assistance Details  cues for upright posture and position in RW     Ambulation Distance (Feet)  105 Feet   105'x2   Assistive device  Rolling walker    Gait Pattern  Step-through pattern;Decreased step length - right;Decreased step length - left;Decreased stride length;Trunk flexed;Wide base of support    Ambulation Surface  Level;Indoor      Therapeutic Activites    Therapeutic Activities  Other  Therapeutic Activities    Other Therapeutic Activities  Pt brought up shunt procedure -- PT discussed further discussing procedure with neurosurgeon to address questions regarding the procedure and the benefits or it or if pt should just continue physical therapy. Pt expressed daughter and son are concerned about procedure. PT encouraged pt to contact neurosurgeon to further discuss matter and emphasized that it is the pt's choice and he should make that between him and the neurosurgeon.       Knee/Hip Exercises: Aerobic   Stepper  Sci Fit at level 1 x 10 minutes, at pt request, with bilat UE and LE with improved LE ROM and tolerance to aerobic exercise today. Intermittent cues to internally rotate R hip to keep knee alignment. Intermittent assistance needed to reposition L foot today. Assist to lift LE onto foot pedal.       Knee/Hip Exercises: Standing   Hip Flexion  Stengthening;Right;1 set;Knee bent   8 reps    Hip Flexion Limitations  red tband resistance with UE support on RW    Hip ADduction  --    Hip ADduction Limitations  --    Hip Abduction  Stengthening;Right;1 set;Knee straight   8 reps   Abduction Limitations  red tband resistance with UE support on RW    Hip Extension  Stengthening;Right;1 set;Knee straight   8 reps   Extension Limitations  red tband resistance with UE support on RW             PT Education - 10/29/19 1114    Education Details  see TA    Person(s) Educated  Patient    Methods  Explanation    Comprehension  Verbalized understanding       PT Short Term Goals - 10/08/19 1849      PT SHORT TERM GOAL #1   Title  Pt will initiate standing balance, endurance and LE strengthening HEP    Baseline  MET 10/08/2019    Time  4    Period  Weeks    Status  Achieved    Target Date  10/06/19      PT SHORT TERM GOAL #2   Title  Pt will improve TUG to </= 60 seconds with LRAD and supervision    Baseline  MET 10/08/2019  TUG with RW 29.69sec    Time  4     Period  Weeks    Status  Achieved    Target Date  10/06/19      PT SHORT TERM GOAL #3   Title  Pt will   improve standing balance as indicated by 4 point improvement in BERG    Baseline  MET 10/08/2019  Berg 20/56 from 16/56    Time  4    Period  Weeks    Status  Achieved    Target Date  10/06/19      PT SHORT TERM GOAL #4   Title  Pt will negotiate 4 stairs with two rails with supervision and decreased use of UE to pull self up stairs    Baseline  MET 10/08/2019    Time  4    Period  Weeks    Status  Achieved    Target Date  10/06/19      PT SHORT TERM GOAL #5   Title  Pt will ambulate x 115' with LRAD and supervision over indoor, level surfaces to indicate improved endurance    Baseline  MET 10/08/2019 with RW    Time  4    Period  Weeks    Status  Achieved    Target Date  10/06/19        PT Long Term Goals - 10/08/19 1854      PT LONG TERM GOAL #1   Title  Pt will demonstrate independence with final LE strength and balance HEP  (11/05/2019 due date for all LTG)    Time  8    Period  Weeks    Status  On-going    Target Date  11/05/19      PT LONG TERM GOAL #2   Title  Pt will demonstrate decreased falls risk as indicated by decrease in TUG to </= 45 seconds with LRAD    Time  8    Period  Weeks    Status  On-going    Target Date  11/05/19      PT LONG TERM GOAL #3   Title  Pt will improve BERG balance to >/= 24/56    Time  8    Period  Weeks    Status  On-going    Target Date  11/05/19      PT LONG TERM GOAL #4   Title  Pt will ambulate x 230' over indoor, level surfaces with LRAD, supervision and increased upright trunk posture    Time  8    Period  Weeks    Status  On-going    Target Date  11/05/19      PT LONG TERM GOAL #5   Title  Pt will improve distance on 2 minute walk test by 40' while maintaining RPE at 12-13    Baseline  141'    Time  8    Period  Weeks    Status  On-going    Target Date  11/05/19            Plan - 10/29/19 1116     Clinical Impression Statement  PT session continued to focus on aerobic conditioning with SCI FIT stepper. Pt demonstrated improved tolerance with activity able to tolerate increased duration of time. Wanted to progress slowly, pt was persistent to perform stepper for 10 minutes and was significantly fatigued following exercise. Vitals remained stable throughout session. Continued theraband exercises for LE strengthening in standing and brainstormed ideas of how to safely perform at home. Pt did not require seated rest breaks in between each LE exercise however was only able to perform RLE today d/t fatigue and time constraint. Will continue to assess safety at home and brainstorm how to make it functionally  safe to perform as a home exercise program to continue progressing pt towards LTGs. Pt may benefit from continued skilled therapy services to address deficits.    Personal Factors and Comorbidities  Behavior Pattern;Comorbidity 3+;Fitness;Past/Current Experience;Social Background    Comorbidities  peripheral neuropathy, peripheral edema due to chronic venous stasis, pancreatitis due to biliary obstruction, OSA on CPAP, obesity, HTN, hydrocephalus, melanoma, chronic low back pain, lumbar surgery, bilat carpal tunnel release, endovenous ablation saphenous vein with laser, insomnia, sepsis due to LE cellulitis, chronic diastolic CHF, cervicalgia and multiple falls    Examination-Activity Limitations  Bend;Locomotion Level;Stairs;Stand;Transfers    Examination-Participation Restrictions  Driving    Stability/Clinical Decision Making  Evolving/Moderate complexity    Rehab Potential  Fair    PT Frequency  2x / week    PT Duration  8 weeks    PT Treatment/Interventions  ADLs/Self Care Home Management;Cryotherapy;Electrical Stimulation;Moist Heat;DME Instruction;Gait training;Stair training;Functional mobility training;Therapeutic activities;Therapeutic exercise;Balance training;Neuromuscular  re-education;Patient/family education;Orthotic Fit/Training;Passive range of motion    PT Next Visit Plan  Did his daughter call Dr. about patient's need for surgery?  Start with 4 way hip before Sci Fit on WED; Warm up on SCI fit.  add to HEP as appropriate - standing balance and LE strengthening.  Weight shifting and balance reaction training; gait with more upright trunk and gaze - decreased pressure through UE    PT Home Exercise Plan  [email or web address]    Consulted and Agree with Plan of Care  Patient       Patient will benefit from skilled therapeutic intervention in order to improve the following deficits and impairments:  Abnormal gait, Cardiopulmonary status limiting activity, Decreased activity tolerance, Decreased balance, Decreased endurance, Decreased strength, Difficulty walking, Impaired sensation, Postural dysfunction, Pain, Decreased range of motion  Visit Diagnosis: Unsteadiness on feet  Other abnormalities of gait and mobility  Abnormal posture  Repeated falls  Muscle weakness (generalized)     Problem List Patient Active Problem List   Diagnosis Date Noted  . Hypotension   . Fall   . Severe sepsis (HCC) 03/14/2019  . Varicose veins of bilateral lower extremities with other complications 05/23/2017  . Cellulitis of lower extremity   . Sepsis due to cellulitis (HCC) 03/21/2017  . Essential hypertension 03/21/2017  . Chronic diastolic CHF (congestive heart failure) (HCC) 03/21/2017  . Renal insufficiency 03/21/2017  . Meralgia paresthetica 10/12/2013  . Polyneuropathy in other diseases classified elsewhere (HCC) 11/22/2012  . Obstructive sleep apnea (adult) (pediatric) 11/22/2012  . Abnormality of gait 11/22/2012  . Headache(784.0) 11/22/2012  . Pain in limb 11/22/2012  . Lumbago 11/22/2012  . Cervicalgia 11/22/2012  . Spinal stenosis in cervical region 11/22/2012  . Degeneration of intervertebral disc, site unspecified 11/22/2012  . Degeneration of cervical  intervertebral disc 11/22/2012  . Obstructive hydrocephalus (HCC) 11/22/2012    Nicole Turcotte SPT 10/29/2019, 12:05 PM  [facility] [address] [place], [place], [address] Phone: [phone]   Fax:  [phone]  Name: [name] MRN: [id number] Date of Birth: 12/10/1955   

## 2019-10-30 ENCOUNTER — Other Ambulatory Visit: Payer: Self-pay | Admitting: Diagnostic Neuroimaging

## 2019-10-30 NOTE — Telephone Encounter (Signed)
Pt is requesting a refill of HYDROcodone-acetaminophen (NORCO) 7.5-325 MG tablet, to be sent to [facility] - [place], [place] - [address]

## 2019-10-31 ENCOUNTER — Ambulatory Visit: Payer: Medicare Other | Admitting: Physical Therapy

## 2019-10-31 ENCOUNTER — Encounter: Payer: Self-pay | Admitting: Physical Therapy

## 2019-10-31 ENCOUNTER — Other Ambulatory Visit: Payer: Self-pay

## 2019-10-31 DIAGNOSIS — R2689 Other abnormalities of gait and mobility: Secondary | ICD-10-CM

## 2019-10-31 DIAGNOSIS — R293 Abnormal posture: Secondary | ICD-10-CM

## 2019-10-31 DIAGNOSIS — M6281 Muscle weakness (generalized): Secondary | ICD-10-CM

## 2019-10-31 DIAGNOSIS — R2681 Unsteadiness on feet: Secondary | ICD-10-CM | POA: Diagnosis not present

## 2019-10-31 DIAGNOSIS — R296 Repeated falls: Secondary | ICD-10-CM

## 2019-10-31 NOTE — Therapy (Signed)
[facility] [address] [place], [place], [address] Phone: [phone]   Fax:  [phone]  Physical Therapy Treatment and 10th visit PN  Patient Details  Name: [name] MRN: [id number] Date of Birth: 02-Jan-1956 Referring Provider (PT): Kathrynn Ducking, MD   Encounter Date: 10/31/2019   Progress Note  Reporting Period 09/06/2019 to 10/31/2019  See note below for Objective Data and Assessment of Progress/Goals.    PT End of Session - 10/31/19 1408    Visit Number  10    Number of Visits  17    Date for PT Re-Evaluation  11/05/19    Authorization Type  [employer] Medicare; $35 copay.  10th visit PN    PT Start Time  1235    PT Stop Time  1318    PT Time Calculation (min)  43 min    Activity Tolerance  Patient tolerated treatment well    Behavior During Therapy  WFL for tasks assessed/performed   verbose, needs frequent re-direction      Past Medical History:  Diagnosis Date  . Cataracts, bilateral   . Chronic insomnia   . Chronic low back pain   . Gait disorder   . History of headache   . History of melanoma   . Hydrocephalus ([place])   . Hypertension   . Obesity   . Obstructive sleep apnea on CPAP   . Pancreatitis    Secondary to gallstones  . Pancreatitis due to biliary obstruction   . Peripheral edema    Chronic venous stasis  . Peripheral neuropathy     Past Surgical History:  Procedure Laterality Date  . CARPAL TUNNEL RELEASE Bilateral   . CHOLECYSTECTOMY    . ENDOVENOUS ABLATION SAPHENOUS VEIN W/ LASER Left 06/01/2017   endovenous laser ablation left greater saphenous vein by Tinnie Gens MD   . ENDOVENOUS ABLATION SAPHENOUS VEIN W/ LASER Right 06/27/2017   endovenous laser ablation right GSV  by Tinnie Gens MD  . [place]    . LUMBAR SPINE SURGERY    . TONSILLECTOMY      There were no vitals filed for this visit.  Subjective Assessment - 10/31/19 1240    Subjective  Pt  was walking in the garage getting ready to come to therapy without the RW and fell to the side hitting R side.  Was able to get self back up; feels some soreness but no significant increase in hip pain.  Does not feel he has an injury and would like to go forward with therapy today.  Did give doctor's number to his daughter, states, "she just wants me to continue with the therapy."    Pertinent History  peripheral neuropathy, peripheral edema due to chronic venous stasis, pancreatitis due to biliary obstruction, OSA on CPAP, obesity, HTN, hydrocephalus, melanoma, chronic low back pain, lumbar surgery, bilat carpal tunnel release, endovenous ablation saphenous vein with laser, insomnia, sepsis due to LE cellulitis, chronic diastolic CHF, cervicalgia and multiple falls    Diagnostic tests  CT scan - 1. Marked enlargement of the third and lateral ventricles with effacement of the sulci consistent with obstructive hydrocephalus.  There is no change compared to the 03/14/2019 CT scan.  Older CT scans were not available for comparison and 2.   No acute findings    Patient Stated Goals  "take 20 years off my life" - walking without a cane, RW or w/c.    Currently in Pain?  Yes  Pain Onset  More than a month ago         [facility] PT Assessment - 10/31/19 1310      Assessment   Medical Diagnosis  Gait abnormality due to hydrocephalus and peripheral neuropathy    Referring Provider (PT)  Kathrynn Ducking, MD    Onset Date/Surgical Date  08/16/19    Next MD Visit  Neurosurgery next week    Prior Therapy  yes - following back surgery      Precautions   Precautions  Fall    Precaution Comments  peripheral neuropathy, peripheral edema due to chronic venous stasis, pancreatitis due to biliary obstruction, OSA on CPAP, obesity, HTN, hydrocephalus, melanoma, chronic low back pain, lumbar surgery, bilat carpal tunnel release, endovenous ablation saphenous vein with laser, insomnia, sepsis due to LE cellulitis,  chronic diastolic CHF, cervicalgia and multiple falls      Timed Up and Go Test   TUG  --          10/31/19 1423  Transfers  Transfers Sit to Stand  Sit to Stand 4: Min assist  Sit to Stand Details (indicate cue type and reason) increased time and verbal cues for anterior lean and weight shift forwards; performed >6 reps from arm chair.  Slightly increased difficulty today due to R hip pain  Stand to Sit 5: Supervision      Access Code: [facility]  URL: [email or web address]  Date: 10/31/2019  Prepared by: Misty Stanley   Exercises Seated Heel Toe Raises - 10 reps - 2 sets - 2x daily - 7x weekly Seated Long Arc Quad - 10 reps - 2 sets - 2x daily - 7x weekly Standing Hip Extension with Resistance - 10 reps - 2 sets - 1x daily - 7x weekly Sit to Stand with Armchair - 10 reps - 2 sets - 1x daily - 7x weekly Standing Hip Abduction with Resistance - 10 reps - 2 sets - 1x daily - 7x weekly Standing Hip Flexion with Resistance - 10 reps - 2 sets - 1x daily - 7x weekly      PT Education - 10/31/19 1244    Education Details  ice to R hip for a few days followed by heat; advised pt to contact physician if pain worsens.  Added resisted hip exercises to HEP with  theraband.  Pt continues to verbalize anxiety about undergoing surgery - continued to encourage pt to discuss with physician and weigh the risks with the benefits.    Person(s) Educated  Patient    Methods  Explanation;Demonstration;Handout    Comprehension  Verbalized understanding;Returned demonstration       PT Short Term Goals - 10/08/19 1849      PT SHORT TERM GOAL #1   Title  Pt will initiate standing balance, endurance and LE strengthening HEP    Baseline  MET 10/08/2019    Time  4    Period  Weeks    Status  Achieved    Target Date  10/06/19      PT SHORT TERM GOAL #2   Title  Pt will improve TUG to </= 60 seconds with LRAD and supervision    Baseline  MET 10/08/2019  TUG with RW 29.69sec    Time   4    Period  Weeks    Status  Achieved    Target Date  10/06/19      PT SHORT TERM GOAL #3   Title  Pt will improve standing balance as indicated  by 4 point improvement in BERG    Baseline  MET 10/08/2019  Berg 20/56 from 16/56    Time  4    Period  Weeks    Status  Achieved    Target Date  10/06/19      PT SHORT TERM GOAL #4   Title  Pt will negotiate 4 stairs with two rails with supervision and decreased use of UE to pull self up stairs    Baseline  MET 10/08/2019    Time  4    Period  Weeks    Status  Achieved    Target Date  10/06/19      PT SHORT TERM GOAL #5   Title  Pt will ambulate x 115' with LRAD and supervision over indoor, level surfaces to indicate improved endurance    Baseline  MET 10/08/2019 with RW    Time  4    Period  Weeks    Status  Achieved    Target Date  10/06/19        PT Long Term Goals - 10/08/19 1854      PT LONG TERM GOAL #1   Title  Pt will demonstrate independence with final LE strength and balance HEP  (11/05/2019 due date for all LTG)    Time  8    Period  Weeks    Status  On-going    Target Date  11/05/19      PT LONG TERM GOAL #2   Title  Pt will demonstrate decreased falls risk as indicated by decrease in TUG to </= 45 seconds with LRAD    Time  8    Period  Weeks    Status  On-going    Target Date  11/05/19      PT LONG TERM GOAL #3   Title  Pt will improve BERG balance to >/= 24/56    Time  8    Period  Weeks    Status  On-going    Target Date  11/05/19      PT LONG TERM GOAL #4   Title  Pt will ambulate x 230' over indoor, level surfaces with LRAD, supervision and increased upright trunk posture    Time  8    Period  Weeks    Status  On-going    Target Date  11/05/19      PT LONG TERM GOAL #5   Title  Pt will improve distance on 2 minute walk test by 40' while maintaining RPE at 12-13    Baseline  141'    Time  8    Period  Weeks    Status  On-going    Target Date  11/05/19            Plan - 10/31/19  1408    Clinical Impression Statement  Pt reports fall today when ambulating without RW; reinforced importance of ambulating with RW always to improve stability and balance and to prevent falls.  Advised pt to use ice for soreness after fall but to alert physician if pain significantly increases.  Despite increased hip pain pt was able to participate in review of resisted hip exercises in standing with pt demonstrating ability to tie band around table leg and safely place around and remove from each leg after each exercise; required multiple seated rest breaks which allowed pt to continue to perform sit > stand training.  Pt is making slow progress towards goals - unable to initiate assessment  of LTG today but pt has been able to progress from primarily seated exercises to standing exercises and continues to perform walking program and has increased to 20 minutes.  Will continue to assess progress at next visit and make plan for recertification.    Personal Factors and Comorbidities  Behavior Pattern;Comorbidity 3+;Fitness;Past/Current Experience;Social Background    Comorbidities  peripheral neuropathy, peripheral edema due to chronic venous stasis, pancreatitis due to biliary obstruction, OSA on CPAP, obesity, HTN, hydrocephalus, melanoma, chronic low back pain, lumbar surgery, bilat carpal tunnel release, endovenous ablation saphenous vein with laser, insomnia, sepsis due to LE cellulitis, chronic diastolic CHF, cervicalgia and multiple falls    Examination-Activity Limitations  Bend;Locomotion Level;Stairs;Stand;Transfers    Examination-Participation Restrictions  Driving    Stability/Clinical Decision Making  Evolving/Moderate complexity    Rehab Potential  Fair    PT Frequency  2x / week    PT Duration  8 weeks    PT Treatment/Interventions  ADLs/Self Care Home Management;Cryotherapy;Electrical Stimulation;Moist Heat;DME Instruction;Gait training;Stair training;Functional mobility  training;Therapeutic activities;Therapeutic exercise;Balance training;Neuromuscular re-education;Patient/family education;Orthotic Fit/Training;Passive range of motion    PT Next Visit Plan  Check LTG - recert?  Warm up on SCI fit.  add to HEP as appropriate - standing balance and LE strengthening.  Weight shifting and balance reaction training; gait with more upright trunk and gaze - decreased pressure through UE    PT [place] and Agree with Plan of Care  Patient       Patient will benefit from skilled therapeutic intervention in order to improve the following deficits and impairments:  Abnormal gait, Cardiopulmonary status limiting activity, Decreased activity tolerance, Decreased balance, Decreased endurance, Decreased strength, Difficulty walking, Impaired sensation, Postural dysfunction, Pain, Decreased range of motion  Visit Diagnosis: Unsteadiness on feet  Other abnormalities of gait and mobility  Abnormal posture  Repeated falls  Muscle weakness (generalized)     Problem List Patient Active Problem List   Diagnosis Date Noted  . Hypotension   . Fall   . Severe sepsis ([place]) 03/14/2019  . Varicose veins of bilateral lower extremities with other complications 23/34/3568  . Cellulitis of lower extremity   . Sepsis due to cellulitis ([place]) 03/21/2017  . Essential hypertension 03/21/2017  . Chronic diastolic CHF (congestive heart failure) ([place]) 03/21/2017  . Renal insufficiency 03/21/2017  . Meralgia paresthetica 10/12/2013  . Polyneuropathy in other diseases classified elsewhere ([place]) 11/22/2012  . Obstructive sleep apnea (adult) (pediatric) 11/22/2012  . Abnormality of gait 11/22/2012  . Headache(784.0) 11/22/2012  . Pain in limb 11/22/2012  . Lumbago 11/22/2012  . Cervicalgia 11/22/2012  . Spinal stenosis in cervical region 11/22/2012  . Degeneration of intervertebral disc, site unspecified 11/22/2012  . Degeneration of cervical  intervertebral disc 11/22/2012  . Obstructive hydrocephalus ([place]) 11/22/2012    Rico Junker, PT, DPT 10/31/19    2:26 PM    [facility] [address] [place], [place], [address] Phone: [phone]   Fax:  [phone]  Name: FERDIE BAKKEN MRN: [id number] Date of Birth: 1956-05-31

## 2019-10-31 NOTE — Patient Instructions (Signed)
Walking Program: Using the walker - walk for a continuous 3 minutes (180 seconds) doing laps in your house.   Please try to do it twice a day - once in the mid morning and once in the afternoon.    Access Code: [email or web address]  URL: [email or web address]  Date: 10/31/2019  Prepared by: Bufford Lope   Exercises Seated Heel Toe Raises - 10 reps - 2 sets - 2x daily - 7x weekly Seated Long Arc Quad - 10 reps - 2 sets - 2x daily - 7x weekly Standing Hip Extension with Resistance - 10 reps - 2 sets - 1x daily - 7x weekly Sit to Stand with Armchair - 10 reps - 2 sets - 1x daily - 7x weekly Standing Hip Abduction with Resistance - 10 reps - 2 sets - 1x daily - 7x weekly Standing Hip Flexion with Resistance - 10 reps - 2 sets - 1x daily - 7x weekly

## 2019-11-05 ENCOUNTER — Ambulatory Visit: Payer: Medicare Other | Admitting: Physical Therapy

## 2019-11-05 ENCOUNTER — Other Ambulatory Visit: Payer: Self-pay

## 2019-11-05 ENCOUNTER — Encounter: Payer: Self-pay | Admitting: Physical Therapy

## 2019-11-05 DIAGNOSIS — R2681 Unsteadiness on feet: Secondary | ICD-10-CM

## 2019-11-05 DIAGNOSIS — R296 Repeated falls: Secondary | ICD-10-CM

## 2019-11-05 DIAGNOSIS — R293 Abnormal posture: Secondary | ICD-10-CM

## 2019-11-05 DIAGNOSIS — R2689 Other abnormalities of gait and mobility: Secondary | ICD-10-CM

## 2019-11-05 DIAGNOSIS — M6281 Muscle weakness (generalized): Secondary | ICD-10-CM

## 2019-11-05 NOTE — Therapy (Signed)
[facility] [address] [place], [place], [address] Phone: [phone]   Fax:  [phone]  Physical Therapy Treatment  Patient Details  Name: [name] MRN: [id number] Date of Birth: August 25, 1956 Referring Provider (PT): Kathrynn Ducking, MD   Encounter Date: 11/05/2019  PT End of Session - 11/05/19 1246    Visit Number  11    Number of Visits  17    Date for PT Re-Evaluation  11/05/19    Authorization Type  [employer] Medicare; $35 copay.  10th visit PN    PT Start Time  1148    PT Stop Time  1235    PT Time Calculation (min)  47 min    Activity Tolerance  Patient tolerated treatment well    Behavior During Therapy  WFL for tasks assessed/performed   verbose, needs frequent re-direction      Past Medical History:  Diagnosis Date  . Cataracts, bilateral   . Chronic insomnia   . Chronic low back pain   . Gait disorder   . History of headache   . History of melanoma   . Hydrocephalus ([place])   . Hypertension   . Obesity   . Obstructive sleep apnea on CPAP   . Pancreatitis    Secondary to gallstones  . Pancreatitis due to biliary obstruction   . Peripheral edema    Chronic venous stasis  . Peripheral neuropathy     Past Surgical History:  Procedure Laterality Date  . CARPAL TUNNEL RELEASE Bilateral   . CHOLECYSTECTOMY    . ENDOVENOUS ABLATION SAPHENOUS VEIN W/ LASER Left 06/01/2017   endovenous laser ablation left greater saphenous vein by Tinnie Gens MD   . ENDOVENOUS ABLATION SAPHENOUS VEIN W/ LASER Right 06/27/2017   endovenous laser ablation right GSV  by Tinnie Gens MD  . [place]    . LUMBAR SPINE SURGERY    . TONSILLECTOMY      There were no vitals filed for this visit.  Subjective Assessment - 11/05/19 1153    Subjective  No falls or changes at this time. Reports procedure is scheduled for 11/16/19.    Pertinent History  peripheral neuropathy, peripheral edema due to chronic  venous stasis, pancreatitis due to biliary obstruction, OSA on CPAP, obesity, HTN, hydrocephalus, melanoma, chronic low back pain, lumbar surgery, bilat carpal tunnel release, endovenous ablation saphenous vein with laser, insomnia, sepsis due to LE cellulitis, chronic diastolic CHF, cervicalgia and multiple falls    Diagnostic tests  CT scan - 1. Marked enlargement of the third and lateral ventricles with effacement of the sulci consistent with obstructive hydrocephalus.  There is no change compared to the 03/14/2019 CT scan.  Older CT scans were not available for comparison and 2.   No acute findings    Patient Stated Goals  "take 20 years off my life" - walking without a cane, RW or w/c.    Currently in Pain?  Yes    Pain Location  Back    Pain Onset  More than a month ago         [facility] PT Assessment - 11/05/19 1204      Assessment   Medical Diagnosis  Gait abnormality due to hydrocephalus and peripheral neuropathy    Referring Provider (PT)  Kathrynn Ducking, MD      Standardized Balance Assessment   Standardized Balance Assessment  Timed Up and Go Test;Berg Balance Test      [facility] Balance  Test   Sit to Stand  Able to stand  independently using hands    Standing Unsupported  Able to stand safely 2 minutes    Sitting with Back Unsupported but Feet Supported on Floor or Stool  Able to sit safely and securely 2 minutes    Stand to Sit  Controls descent by using hands    Transfers  Able to transfer with verbal cueing and /or supervision    Standing Unsupported with Eyes Closed  Able to stand 3 seconds    Standing Unsupported with Feet Together  Needs help to attain position but able to stand for 30 seconds with feet together    From Standing, Reach Forward with Outstretched Arm  Reaches forward but needs supervision    From Standing Position, Pick up Object from Floor  Unable to try/needs assist to keep balance   able to pick up requires RW support and minA for safety   From Standing  Position, Turn to Look Behind Over each Shoulder  Needs supervision when turning    Turn 360 Degrees  Needs close supervision or verbal cueing    Standing Unsupported, Alternately Place Feet on Step/Stool  Needs assistance to keep from falling or unable to try    Standing Unsupported, One Foot in Front  Needs help to step but can hold 15 seconds    Standing on One Leg  Unable to try or needs assist to prevent fall    Total Score  23    Berg comment:  20/56 on 10/08/19       Timed Up and Go Test   TUG  Normal TUG    Normal TUG (seconds)  27.69                   [facility] Adult PT Treatment/Exercise - 11/05/19 1239      Transfers   Transfers  Sit to Stand    Sit to Stand  5: Supervision    Sit to Stand Details  Verbal cues for sequencing;Verbal cues for technique;Verbal cues for precautions/safety;Tactile cues for weight shifting    Sit to Stand Details (indicate cue type and reason)  cues for anterior trunk lean, intermittently able to perform without multiple attempts    Stand to Sit  5: Supervision      Therapeutic Activites    Therapeutic Activities  Other Therapeutic Activities    Other Therapeutic Activities  Asked pt about HEP set up. Stated ability to complete all exercises on HEP but it was challenging to set up, however no LOB or falls. Talked through each exercise with pt - cues for longer hold times for muscle activation.              PT Education - 11/06/19 1650    Education Details  plan to recertify for 3 more visits and then D/C next week just prior to shunt procedure.  Pt will require new referral to re-start outpatient therapy once cleared by physician    Person(s) Educated  Patient    Methods  Explanation    Comprehension  Verbalized understanding       PT Short Term Goals - 10/08/19 1849      PT SHORT TERM GOAL #1   Title  Pt will initiate standing balance, endurance and LE strengthening HEP    Baseline  MET 10/08/2019    Time  4    Period   Weeks    Status  Achieved    Target Date  10/06/19      PT SHORT TERM GOAL #2   Title  Pt will improve TUG to </= 60 seconds with LRAD and supervision    Baseline  MET 10/08/2019  TUG with RW 29.69sec    Time  4    Period  Weeks    Status  Achieved    Target Date  10/06/19      PT SHORT TERM GOAL #3   Title  Pt will improve standing balance as indicated by 4 point improvement in BERG    Baseline  MET 10/08/2019  Berg 20/56 from 16/56    Time  4    Period  Weeks    Status  Achieved    Target Date  10/06/19      PT SHORT TERM GOAL #4   Title  Pt will negotiate 4 stairs with two rails with supervision and decreased use of UE to pull self up stairs    Baseline  MET 10/08/2019    Time  4    Period  Weeks    Status  Achieved    Target Date  10/06/19      PT SHORT TERM GOAL #5   Title  Pt will ambulate x 115' with LRAD and supervision over indoor, level surfaces to indicate improved endurance    Baseline  MET 10/08/2019 with RW    Time  4    Period  Weeks    Status  Achieved    Target Date  10/06/19        PT Long Term Goals - 11/05/19 1155      PT LONG TERM GOAL #1   Title  Pt will demonstrate independence with final LE strength and balance HEP  (11/05/2019 due date for all LTG)    Baseline  11/05/2019: MET    Time  8    Period  Weeks    Status  Achieved      PT LONG TERM GOAL #2   Title  Pt will demonstrate decreased falls risk as indicated by decrease in TUG to </= 45 seconds with LRAD    Baseline  11/05/2019: 27.69 seconds    Time  8    Period  Weeks    Status  Achieved      PT LONG TERM GOAL #3   Title  Pt will improve BERG balance to >/= 24/56    Baseline  NOT MET but progressing 11/05/2019: 23/56    Time  8    Period  Weeks    Status  Not Met      PT LONG TERM GOAL #4   Title  Pt will ambulate x 230' over indoor, level surfaces with LRAD, supervision and increased upright trunk posture    Time  8    Period  Weeks    Status  On-going      PT LONG TERM  GOAL #5   Title  Pt will improve distance on 2 minute walk test by 40' while maintaining RPE at 12-13    Baseline  141'    Time  8    Period  Weeks    Status  On-going            Plan - 11/05/19 1247    Clinical Impression Statement  PT session focused on beginning LTG assessment. Pt is making good progress towards goals. He has achieved his timed up and go goal performing in 27.69s and has not met but is progressing towards  BERG goal with a score of 23/56. He continues to demonstrate improved stability reducing fall risk but is challenged with activities without UE support or in single leg stance. Will continue to assess remaining LTGs at next session and plan to re-cert for additional visits. Pt may benefit from continued skilled therapy services to address deficits.    Personal Factors and Comorbidities  Behavior Pattern;Comorbidity 3+;Fitness;Past/Current Experience;Social Background    Comorbidities  peripheral neuropathy, peripheral edema due to chronic venous stasis, pancreatitis due to biliary obstruction, OSA on CPAP, obesity, HTN, hydrocephalus, melanoma, chronic low back pain, lumbar surgery, bilat carpal tunnel release, endovenous ablation saphenous vein with laser, insomnia, sepsis due to LE cellulitis, chronic diastolic CHF, cervicalgia and multiple falls    Examination-Activity Limitations  Bend;Locomotion Level;Stairs;Stand;Transfers    Examination-Participation Restrictions  Driving    Stability/Clinical Decision Making  Evolving/Moderate complexity    Rehab Potential  Fair    PT Frequency  2x / week    PT Duration  8 weeks    PT Treatment/Interventions  ADLs/Self Care Home Management;Cryotherapy;Electrical Stimulation;Moist Heat;DME Instruction;Gait training;Stair training;Functional mobility training;Therapeutic activities;Therapeutic exercise;Balance training;Neuromuscular re-education;Patient/family education;Orthotic Fit/Training;Passive range of motion    PT Next Visit  Plan  Continue LTG assessment - recert for final 3 visits and then D/C prior to shunt procedure.  Warm up on SCI Fit, add to HEP as appropriate.  Weight shifting and balance reaction training; SLS training    PT [place] and Agree with Plan of Care  Patient       Patient will benefit from skilled therapeutic intervention in order to improve the following deficits and impairments:  Abnormal gait, Cardiopulmonary status limiting activity, Decreased activity tolerance, Decreased balance, Decreased endurance, Decreased strength, Difficulty walking, Impaired sensation, Postural dysfunction, Pain, Decreased range of motion  Visit Diagnosis: Unsteadiness on feet  Other abnormalities of gait and mobility  Abnormal posture  Repeated falls  Muscle weakness (generalized)     Problem List Patient Active Problem List   Diagnosis Date Noted  . Hypotension   . Fall   . Severe sepsis ([place]) 03/14/2019  . Varicose veins of bilateral lower extremities with other complications 58/30/9407  . Cellulitis of lower extremity   . Sepsis due to cellulitis ([place]) 03/21/2017  . Essential hypertension 03/21/2017  . Chronic diastolic CHF (congestive heart failure) ([place]) 03/21/2017  . Renal insufficiency 03/21/2017  . Meralgia paresthetica 10/12/2013  . Polyneuropathy in other diseases classified elsewhere ([place]) 11/22/2012  . Obstructive sleep apnea (adult) (pediatric) 11/22/2012  . Abnormality of gait 11/22/2012  . Headache(784.0) 11/22/2012  . Pain in limb 11/22/2012  . Lumbago 11/22/2012  . Cervicalgia 11/22/2012  . Spinal stenosis in cervical region 11/22/2012  . Degeneration of intervertebral disc, site unspecified 11/22/2012  . Degeneration of cervical intervertebral disc 11/22/2012  . Obstructive hydrocephalus ([place]) 11/22/2012    Juliann Pulse SPT 11/06/2019, 4:55 PM  [facility] [address] [place], [place], [address] Phone: [phone]   Fax:  [phone]  Name: [name] MRN: [id number] Date of Birth: 1956-03-26

## 2019-11-05 NOTE — Patient Instructions (Signed)
Walking Program: Using the walker - walk for a continuous 3 minutes (180 seconds) doing laps in your house.   Please try to do it twice a day - once in the mid morning and once in the afternoon.    Access Code: [email or web address]  URL: [email or web address]  Date: 10/31/2019  Prepared by: Audra Potter   Exercises Seated Heel Toe Raises - 10 reps - 2 sets - 2x daily - 7x weekly Seated Long Arc Quad - 10 reps - 2 sets - 2x daily - 7x weekly Standing Hip Extension with Resistance - 10 reps - 2 sets - 1x daily - 7x weekly Sit to Stand with Armchair - 10 reps - 2 sets - 1x daily - 7x weekly Standing Hip Abduction with Resistance - 10 reps - 2 sets - 1x daily - 7x weekly Standing Hip Flexion with Resistance - 10 reps - 2 sets - 1x daily - 7x weekly 

## 2019-11-07 ENCOUNTER — Encounter: Payer: Self-pay | Admitting: Physical Therapy

## 2019-11-07 ENCOUNTER — Ambulatory Visit: Payer: Medicare Other | Admitting: Physical Therapy

## 2019-11-07 ENCOUNTER — Other Ambulatory Visit: Payer: Self-pay

## 2019-11-07 DIAGNOSIS — R296 Repeated falls: Secondary | ICD-10-CM

## 2019-11-07 DIAGNOSIS — R2689 Other abnormalities of gait and mobility: Secondary | ICD-10-CM

## 2019-11-07 DIAGNOSIS — R2681 Unsteadiness on feet: Secondary | ICD-10-CM | POA: Diagnosis not present

## 2019-11-07 DIAGNOSIS — M6281 Muscle weakness (generalized): Secondary | ICD-10-CM

## 2019-11-07 DIAGNOSIS — R293 Abnormal posture: Secondary | ICD-10-CM

## 2019-11-07 NOTE — Therapy (Signed)
[facility] [address] [place], [place], [address] Phone: [phone]   Fax:  [phone]  Physical Therapy Treatment & Recertification  Patient Details  Name: [name] MRN: [id number] Date of Birth: [birth year] Referring Provider (PT): Kathrynn Ducking, MD   Encounter Date: 11/07/2019  PT End of Session - 11/07/19 1228    Visit Number  12    Number of Visits  17    Date for PT Re-Evaluation  11/16/19    Authorization Type  [employer] Medicare; $35 copay.  10th visit PN    PT Start Time  1230    PT Stop Time  1315    PT Time Calculation (min)  45 min    Activity Tolerance  Patient tolerated treatment well    Behavior During Therapy  WFL for tasks assessed/performed   verbose, needs frequent re-direction      Past Medical History:  Diagnosis Date  . Cataracts, bilateral   . Chronic insomnia   . Chronic low back pain   . Gait disorder   . History of headache   . History of melanoma   . Hydrocephalus ([place])   . Hypertension   . Obesity   . Obstructive sleep apnea on CPAP   . Pancreatitis    Secondary to gallstones  . Pancreatitis due to biliary obstruction   . Peripheral edema    Chronic venous stasis  . Peripheral neuropathy     Past Surgical History:  Procedure Laterality Date  . CARPAL TUNNEL RELEASE Bilateral   . CHOLECYSTECTOMY    . ENDOVENOUS ABLATION SAPHENOUS VEIN W/ LASER Left 06/01/2017   endovenous laser ablation left greater saphenous vein by Tinnie Gens MD   . ENDOVENOUS ABLATION SAPHENOUS VEIN W/ LASER Right 06/27/2017   endovenous laser ablation right GSV  by Tinnie Gens MD  . [place]    . LUMBAR SPINE SURGERY    . TONSILLECTOMY      There were no vitals filed for this visit.  Subjective Assessment - 11/07/19 1229    Subjective  Feels like it's gotten easier to get up out of chairs recently - leaning forward makes it a lot easier. Neck hurts some today, started on  Monday in the afternoon.    Pertinent History  peripheral neuropathy, peripheral edema due to chronic venous stasis, pancreatitis due to biliary obstruction, OSA on CPAP, obesity, HTN, hydrocephalus, melanoma, chronic low back pain, lumbar surgery, bilat carpal tunnel release, endovenous ablation saphenous vein with laser, insomnia, sepsis due to LE cellulitis, chronic diastolic CHF, cervicalgia and multiple falls    Diagnostic tests  CT scan - 1. Marked enlargement of the third and lateral ventricles with effacement of the sulci consistent with obstructive hydrocephalus.  There is no change compared to the 03/14/2019 CT scan.  Older CT scans were not available for comparison and 2.   No acute findings    Patient Stated Goals  "take 20 years off my life" - walking without a cane, RW or w/c.    Currently in Pain?  Yes    Pain Onset  More than a month ago         [facility] PT Assessment - 11/07/19 1237      Assessment   Medical Diagnosis  Gait abnormality due to hydrocephalus and peripheral neuropathy    Referring Provider (PT)  Kathrynn Ducking, MD      6 Minute Walk- Baseline   6 Minute Walk- Baseline  yes    BP (mmHg)  123/72    HR (bpm)  83    02 Sat (%RA)  99 %    Modified Borg Scale for Dyspnea  1- Very mild shortness of breath    Perceived Rate of Exertion (Borg)  9- very light      6 Minute walk- Post Test   6 Minute Walk Post Test  yes    BP (mmHg)  138/70    HR (bpm)  93    02 Sat (%RA)  100 %    Modified Borg Scale for Dyspnea  3- Moderate shortness of breath or breathing difficulty    Perceived Rate of Exertion (Borg)  11- Fairly light      6 minute walk test results    Aerobic Endurance Distance Walked  400    Endurance additional comments  4 minute walk test with RW   attempted for 6MWT, limited d/t back pain                   [facility] Adult PT Treatment/Exercise - 11/07/19 1332      Transfers   Transfers  Sit to Stand    Sit to Stand  5: Supervision    Sit  to Stand Details  Verbal cues for sequencing;Verbal cues for technique;Verbal cues for precautions/safety;Tactile cues for weight shifting    Sit to Stand Details (indicate cue type and reason)  pt demo'd improved anterior weight shifting requiring less cues     Stand to Sit  5: Supervision      Ambulation/Gait   Ambulation/Gait  Yes    Ambulation/Gait Assistance  5: Supervision    Ambulation/Gait Assistance Details  Cues for upright posture and postiing in RW     Ambulation Distance (Feet)  400 Feet   during 4-minute walk test    Assistive device  Rolling walker    Gait Pattern  Step-through pattern;Decreased step length - right;Decreased step length - left;Decreased stride length;Trunk flexed;Wide base of support    Ambulation Surface  Indoor;Level      Neuro Re-ed    Neuro Re-ed Details   Standing at counter with chair behind pt and support laterally: cone reaching to facilitate R<>L wt shifting. With unilateral UE support, reaching for cone ipsilaterally with reach across midline to place cone on counter and back again one at a time. Repeated with opposite UE. Progressed to repeating exercise without UE support.           Balance Exercises - 11/07/19 1327      Balance Exercises: Standing   Partial Tandem Stance  Eyes open;Upper extremity support 1;Intermittent upper extremity support;15 secs   BUE to get into stance, x15s each side unilat UE > no UE    Other Standing Exercises  Standing in RW with light BUE support toe taps 2" step x5 each LE > progressed to unilateral UE support x5 reps each LE    minA to steady with LOBx1 foot stuck on top of step    Other Standing Exercises Comments  In partial tandem stance with front LE on 2" step, static balance x30s each LE with intermittent UE support         PT Education - 11/07/19 1352    Education Details  Therapist encourage pt to eat a fuller meal prior to therapy as he came in with only having had a cup of coffee and a single  piece of toast prior - leaving him feeling lightheaded after  walking test. Therapist educated pt on how food is energy to our bodies which is critical when exercising.    Person(s) Educated  Patient    Methods  Explanation    Comprehension  Verbalized understanding       PT Short Term Goals - 11/07/19 1404      PT SHORT TERM GOAL #1   Title  = LTG        PT Long Term Goals - 11/07/19 1238      PT LONG TERM GOAL #1   Title  Pt will demonstrate independence with updated final LE strength and balance HEP  (11/14/2019 due date for all LTG)    Baseline  --    Time  --    Period  --    Status  Revised      PT LONG TERM GOAL #2   Title  Pt will demonstrate decreased falls risk as indicated by decrease in TUG to </= 45 seconds with LRAD    Baseline  11/05/2019: 27.69 seconds    Time  8    Period  Weeks    Status  Achieved      PT LONG TERM GOAL #3   Title  Pt will improve BERG balance to >/= 24/56    Baseline  NOT MET but progressing 11/05/2019: 23/56    Time  8    Period  Weeks    Status  Not Met      PT LONG TERM GOAL #4   Title  Pt will ambulate x 230' over indoor, level surfaces with LRAD, supervision and increased upright trunk posture    Baseline  11/07/2019: MET by 400' total with RW; verbal cues for upright posture    Time  8    Period  Weeks    Status  Achieved      PT LONG TERM GOAL #5   Title  Pt will improve distance on 2 minute walk test by 40' while maintaining RPE at 12-13    Baseline  11/07/2019: MET    Time  8    Period  Weeks    Status  Achieved            Plan - 11/07/19 1228    Clinical Impression Statement  Finished LTG assessment with pt achieving final 2 LTGs. He demonstrated improved activity tolerance and endurance during walking test with longer distance ambulated of 400' over a 4-minute period. He continues to require cues for upright posture and positioning in RW as he lets the RW get far out in front of him creating a fall risk. Vitals  remained stable throughout - LBP limited pt's ability to continue for a full 6 minutes. He reported feeling light headed and stated he ate two hours prior: coffee & a single piece of toast. Therapist encouraged to have a fuller meal to provide more energy especially with exercising. Pt demonstrated improved weight shifting with balance activities today requiring supervision-minA for stability. Will continue to progress as indicated. Pt may benefit from continued skilled therapy to address deficits.    Personal Factors and Comorbidities  Behavior Pattern;Comorbidity 3+;Fitness;Past/Current Experience;Social Background    Comorbidities  peripheral neuropathy, peripheral edema due to chronic venous stasis, pancreatitis due to biliary obstruction, OSA on CPAP, obesity, HTN, hydrocephalus, melanoma, chronic low back pain, lumbar surgery, bilat carpal tunnel release, endovenous ablation saphenous vein with laser, insomnia, sepsis due to LE cellulitis, chronic diastolic CHF, cervicalgia and multiple falls    Examination-Activity Limitations  Bend;Locomotion Level;Stairs;Stand;Transfers    Examination-Participation Restrictions  Driving    Stability/Clinical Decision Making  Evolving/Moderate complexity    Rehab Potential  Fair    PT Frequency  2x / week    PT Duration  2 weeks    PT Treatment/Interventions  ADLs/Self Care Home Management;Cryotherapy;Electrical Stimulation;Moist Heat;DME Instruction;Gait training;Stair training;Functional mobility training;Therapeutic activities;Therapeutic exercise;Balance training;Neuromuscular re-education;Patient/family education;Orthotic Fit/Training;Passive range of motion    PT Next Visit Plan  Update HEP as indicated prior to d/c. add new tennis ball on RW. Warm up on SCI Fit.  Weight shifting and balance reaction training; SLS training, turns during gait    PT [place] and Agree with Plan of Care  Patient       Patient will benefit  from skilled therapeutic intervention in order to improve the following deficits and impairments:  Abnormal gait, Cardiopulmonary status limiting activity, Decreased activity tolerance, Decreased balance, Decreased endurance, Decreased strength, Difficulty walking, Impaired sensation, Postural dysfunction, Pain, Decreased range of motion  Visit Diagnosis: Unsteadiness on feet  Other abnormalities of gait and mobility  Abnormal posture  Repeated falls  Muscle weakness (generalized)     Problem List Patient Active Problem List   Diagnosis Date Noted  . Hypotension   . Fall   . Severe sepsis ([place]) 03/14/2019  . Varicose veins of bilateral lower extremities with other complications 79/39/6886  . Cellulitis of lower extremity   . Sepsis due to cellulitis ([place]) 03/21/2017  . Essential hypertension 03/21/2017  . Chronic diastolic CHF (congestive heart failure) ([place]) 03/21/2017  . Renal insufficiency 03/21/2017  . Meralgia paresthetica 10/12/2013  . Polyneuropathy in other diseases classified elsewhere ([place]) 11/22/2012  . Obstructive sleep apnea (adult) (pediatric) 11/22/2012  . Abnormality of gait 11/22/2012  . Headache(784.0) 11/22/2012  . Pain in limb 11/22/2012  . Lumbago 11/22/2012  . Cervicalgia 11/22/2012  . Spinal stenosis in cervical region 11/22/2012  . Degeneration of intervertebral disc, site unspecified 11/22/2012  . Degeneration of cervical intervertebral disc 11/22/2012  . Obstructive hydrocephalus ([place]) 11/22/2012   [name], [name] 11/07/2019, 2:06 PM  [facility] [address] [place], [place], [address] Phone: [phone]   Fax:  [phone]  Name: [name] MRN: [id number] Date of Birth: May 17, 1956

## 2019-11-12 ENCOUNTER — Ambulatory Visit: Payer: Medicare Other | Admitting: Physical Therapy

## 2019-11-12 ENCOUNTER — Other Ambulatory Visit: Payer: Self-pay

## 2019-11-12 ENCOUNTER — Encounter: Payer: Self-pay | Admitting: Physical Therapy

## 2019-11-12 DIAGNOSIS — R2681 Unsteadiness on feet: Secondary | ICD-10-CM | POA: Diagnosis not present

## 2019-11-12 DIAGNOSIS — R2689 Other abnormalities of gait and mobility: Secondary | ICD-10-CM

## 2019-11-12 DIAGNOSIS — R296 Repeated falls: Secondary | ICD-10-CM

## 2019-11-12 DIAGNOSIS — R293 Abnormal posture: Secondary | ICD-10-CM

## 2019-11-12 DIAGNOSIS — M6281 Muscle weakness (generalized): Secondary | ICD-10-CM

## 2019-11-12 NOTE — Therapy (Signed)
[facility] [address] [place], [place], [address] Phone: [phone]   Fax:  [phone]  Physical Therapy Treatment  Patient Details  Name: [name] MRN: [id number] Date of Birth: 26-Mar-1956 Referring Provider (PT): Kathrynn Ducking, MD   Encounter Date: 11/12/2019  PT End of Session - 11/12/19 1729    Visit Number  13    Number of Visits  17    Date for PT Re-Evaluation  11/16/19    Authorization Type  [employer] Medicare; $35 copay.  10th visit PN    PT Start Time  1232    PT Stop Time  1317    PT Time Calculation (min)  45 min    Activity Tolerance  Patient tolerated treatment well    Behavior During Therapy  WFL for tasks assessed/performed   verbose, needs frequent re-direction      Past Medical History:  Diagnosis Date  . Cataracts, bilateral   . Chronic insomnia   . Chronic low back pain   . Gait disorder   . History of headache   . History of melanoma   . Hydrocephalus ([place])   . Hypertension   . Obesity   . Obstructive sleep apnea on CPAP   . Pancreatitis    Secondary to gallstones  . Pancreatitis due to biliary obstruction   . Peripheral edema    Chronic venous stasis  . Peripheral neuropathy     Past Surgical History:  Procedure Laterality Date  . CARPAL TUNNEL RELEASE Bilateral   . CHOLECYSTECTOMY    . ENDOVENOUS ABLATION SAPHENOUS VEIN W/ LASER Left 06/01/2017   endovenous laser ablation left greater saphenous vein by Tinnie Gens MD   . ENDOVENOUS ABLATION SAPHENOUS VEIN W/ LASER Right 06/27/2017   endovenous laser ablation right GSV  by Tinnie Gens MD  . [place]    . LUMBAR SPINE SURGERY    . TONSILLECTOMY      There were no vitals filed for this visit.  Subjective Assessment - 11/12/19 1245    Subjective  Has his surgery on Friday.  Joints are a little more painful today due to the weather.  Had to call [employer] because his Lucianne Lei was smoking on the way over to therapy.     Pertinent History  peripheral neuropathy, peripheral edema due to chronic venous stasis, pancreatitis due to biliary obstruction, OSA on CPAP, obesity, HTN, hydrocephalus, melanoma, chronic low back pain, lumbar surgery, bilat carpal tunnel release, endovenous ablation saphenous vein with laser, insomnia, sepsis due to LE cellulitis, chronic diastolic CHF, cervicalgia and multiple falls    Diagnostic tests  CT scan - 1. Marked enlargement of the third and lateral ventricles with effacement of the sulci consistent with obstructive hydrocephalus.  There is no change compared to the 03/14/2019 CT scan.  Older CT scans were not available for comparison and 2.   No acute findings    Patient Stated Goals  "take 20 years off my life" - walking without a cane, RW or w/c.    Currently in Pain?  Yes    Pain Onset  More than a month ago                       [facility] Adult PT Treatment/Exercise - 11/12/19 1246      Ambulation/Gait   Ambulation/Gait  Yes    Ambulation/Gait Assistance  5: Supervision    Ambulation/Gait Assistance Details  demonstrated improved foot clearance  today; still having increased difficulty maintaining upright posture    Ambulation Distance (Feet)  115 Feet    Assistive device  Rolling walker    Gait Pattern  Step-through pattern;Decreased step length - right;Decreased step length - left;Decreased stride length;Wide base of support    Ambulation Surface  Level;Indoor      Knee/Hip Exercises: Aerobic   Stepper  Sci Fit at level 1 x 8 minutes, with pt demonstrating ability to lift each foot independently onto pedals.  Intermittent cues to keep hip rotated inward.  No assistance need to reposition foot today.      Knee/Hip Exercises: Standing   Other Standing Knee Exercises  Standing alternating taps to yoga blocks in front of patient x 12 reps with bilat UE support x 2 sets.  One sitting rest break due to fatigue      Knee/Hip Exercises: Seated   Sit to Sand  2  sets;5 reps;with UE support   with yoga block for hip ADD/IR activation      Pt did spend >5 minutes walking into and out of clinic to check on his Lucianne Lei which had been smoking and [employer] came to tow away to service station.  Not charged for that time.     PT Education - 11/12/19 1728    Education Details  finalize HEP and D/C next session    Person(s) Educated  Patient    Methods  Explanation    Comprehension  Verbalized understanding       PT Short Term Goals - 11/07/19 1404      PT SHORT TERM GOAL #1   Title  = LTG        PT Long Term Goals - 11/07/19 1238      PT LONG TERM GOAL #1   Title  Pt will demonstrate independence with updated final LE strength and balance HEP  (11/14/2019 due date for all LTG)    Baseline  --    Time  --    Period  --    Status  Revised      PT LONG TERM GOAL #2   Title  Pt will demonstrate decreased falls risk as indicated by decrease in TUG to </= 45 seconds with LRAD    Baseline  11/05/2019: 27.69 seconds    Time  8    Period  Weeks    Status  Achieved      PT LONG TERM GOAL #3   Title  Pt will improve BERG balance to >/= 24/56    Baseline  NOT MET but progressing 11/05/2019: 23/56    Time  8    Period  Weeks    Status  Not Met      PT LONG TERM GOAL #4   Title  Pt will ambulate x 230' over indoor, level surfaces with LRAD, supervision and increased upright trunk posture    Baseline  11/07/2019: MET by 400' total with RW; verbal cues for upright posture    Time  8    Period  Weeks    Status  Achieved      PT LONG TERM GOAL #5   Title  Pt will improve distance on 2 minute walk test by 40' while maintaining RPE at 12-13    Baseline  11/07/2019: MET    Time  8    Period  Weeks    Status  Achieved            Plan - 11/12/19 1730  Clinical Impression Statement  Treatment session focused on progression of aerobic conditioning on Sci Fit, LE extensor strengthening for more efficient and safe sit <> stand and standing balance  with more upright posture while fully clearing foot.  Pt is demonstrating improved hip flexion strength with improved foot clearance during gait.  Pt has one more session and then will D/C prior to surgery on Friday.    Personal Factors and Comorbidities  Behavior Pattern;Comorbidity 3+;Fitness;Past/Current Experience;Social Background    Comorbidities  peripheral neuropathy, peripheral edema due to chronic venous stasis, pancreatitis due to biliary obstruction, OSA on CPAP, obesity, HTN, hydrocephalus, melanoma, chronic low back pain, lumbar surgery, bilat carpal tunnel release, endovenous ablation saphenous vein with laser, insomnia, sepsis due to LE cellulitis, chronic diastolic CHF, cervicalgia and multiple falls    Examination-Activity Limitations  Bend;Locomotion Level;Stairs;Stand;Transfers    Examination-Participation Restrictions  Driving    Stability/Clinical Decision Making  Evolving/Moderate complexity    Rehab Potential  Fair    PT Frequency  2x / week    PT Duration  2 weeks    PT Treatment/Interventions  ADLs/Self Care Home Management;Cryotherapy;Electrical Stimulation;Moist Heat;DME Instruction;Gait training;Stair training;Functional mobility training;Therapeutic activities;Therapeutic exercise;Balance training;Neuromuscular re-education;Patient/family education;Orthotic Fit/Training;Passive range of motion    PT Next Visit Plan  Update HEP as indicated prior to d/c. Weight shifting and balance reaction training; SLS training, turns during gait    PT [place] and Agree with Plan of Care  Patient       Patient will benefit from skilled therapeutic intervention in order to improve the following deficits and impairments:  Abnormal gait, Cardiopulmonary status limiting activity, Decreased activity tolerance, Decreased balance, Decreased endurance, Decreased strength, Difficulty walking, Impaired sensation, Postural dysfunction, Pain, Decreased range of  motion  Visit Diagnosis: Other abnormalities of gait and mobility  Unsteadiness on feet  Abnormal posture  Repeated falls  Muscle weakness (generalized)     Problem List Patient Active Problem List   Diagnosis Date Noted  . Hypotension   . Fall   . Severe sepsis ([place]) 03/14/2019  . Varicose veins of bilateral lower extremities with other complications 07/37/1062  . Cellulitis of lower extremity   . Sepsis due to cellulitis ([place]) 03/21/2017  . Essential hypertension 03/21/2017  . Chronic diastolic CHF (congestive heart failure) ([place]) 03/21/2017  . Renal insufficiency 03/21/2017  . Meralgia paresthetica 10/12/2013  . Polyneuropathy in other diseases classified elsewhere ([place]) 11/22/2012  . Obstructive sleep apnea (adult) (pediatric) 11/22/2012  . Abnormality of gait 11/22/2012  . Headache(784.0) 11/22/2012  . Pain in limb 11/22/2012  . Lumbago 11/22/2012  . Cervicalgia 11/22/2012  . Spinal stenosis in cervical region 11/22/2012  . Degeneration of intervertebral disc, site unspecified 11/22/2012  . Degeneration of cervical intervertebral disc 11/22/2012  . Obstructive hydrocephalus ([place]) 11/22/2012    Rico Junker, PT, DPT 11/12/19    5:36 PM    [facility] [address] [place], [place], [address] Phone: [phone]   Fax:  [phone]  Name: [name] MRN: [id number] Date of Birth: Sep 25, 1956

## 2019-11-14 ENCOUNTER — Encounter: Payer: Self-pay | Admitting: Physical Therapy

## 2019-11-14 ENCOUNTER — Ambulatory Visit: Payer: Medicare Other | Admitting: Physical Therapy

## 2019-11-14 NOTE — Therapy (Signed)
[facility] [address] [place], [place], [address] Phone: [phone]   Fax:  [phone]  Patient Details  Name: [name] MRN: [id number] Date of Birth: 11-22-1955 Referring Provider:  No ref. provider found  Encounter Date: 11/14/2019  PHYSICAL THERAPY DISCHARGE SUMMARY  Visits from Start of Care: 13  Current functional level related to goals / functional outcomes: Pt was making slow but steady progress towards goals; see below.  Pt did not present to final PT session to review and finalize HEP.  Pt scheduled for shunt surgery on Friday.  Pt aware that he will require a new referral to return to therapy when appropriate.   PT Long Term Goals - 11/07/19 1238      PT LONG TERM GOAL #1   Title  Pt will demonstrate independence with updated final LE strength and balance HEP  (11/14/2019 due date for all LTG)    Baseline  --    Time  --    Period  --    Status  Revised      PT LONG TERM GOAL #2   Title  Pt will demonstrate decreased falls risk as indicated by decrease in TUG to </= 45 seconds with LRAD    Baseline  11/05/2019: 27.69 seconds    Time  8    Period  Weeks    Status  Achieved      PT LONG TERM GOAL #3   Title  Pt will improve BERG balance to >/= 24/56    Baseline  NOT MET but progressing 11/05/2019: 23/56    Time  8    Period  Weeks    Status  Not Met      PT LONG TERM GOAL #4   Title  Pt will ambulate x 230' over indoor, level surfaces with LRAD, supervision and increased upright trunk posture    Baseline  11/07/2019: MET by 400' total with RW; verbal cues for upright posture    Time  8    Period  Weeks    Status  Achieved      PT LONG TERM GOAL #5   Title  Pt will improve distance on 2 minute walk test by 40' while maintaining RPE at 12-13    Baseline  11/07/2019: MET    Time  8    Period  Weeks    Status  Achieved       Remaining deficits: Impaired LE strength, impaired posture,  impaired balance, impaired balance and gait   Education / Equipment: HEP  Plan: Patient agrees to discharge.  Patient goals were partially met. Patient is being discharged due to a change in medical status.  ?????     Rico Junker, PT, DPT 11/14/19    4:55 PM    [facility] [address] [place], [place], [address] Phone: [phone]   Fax:  [phone]

## 2019-11-17 MED ORDER — ACETAMINOPHEN 325 MG PO TABS
650.00 | ORAL_TABLET | ORAL | Status: DC
Start: ? — End: 2019-11-17

## 2019-11-17 MED ORDER — DOCUSATE SODIUM 283 MG RE ENEM
283.00 | ENEMA | RECTAL | Status: DC
Start: ? — End: 2019-11-17

## 2019-11-17 MED ORDER — GABAPENTIN 400 MG PO CAPS
400.00 | ORAL_CAPSULE | ORAL | Status: DC
Start: 2019-11-17 — End: 2019-11-17

## 2019-11-17 MED ORDER — FUROSEMIDE 20 MG PO TABS
40.00 | ORAL_TABLET | ORAL | Status: DC
Start: 2019-11-17 — End: 2019-11-17

## 2019-11-17 MED ORDER — ONDANSETRON HCL 4 MG/2ML IJ SOLN
4.00 | INTRAMUSCULAR | Status: DC
Start: ? — End: 2019-11-17

## 2019-11-17 MED ORDER — HYDROCODONE-ACETAMINOPHEN 5-325 MG PO TABS
1.00 | ORAL_TABLET | ORAL | Status: DC
Start: ? — End: 2019-11-17

## 2019-11-17 MED ORDER — BISACODYL 10 MG RE SUPP
10.00 | RECTAL | Status: DC
Start: ? — End: 2019-11-17

## 2019-11-17 MED ORDER — SODIUM CHLORIDE FLUSH 0.9 % IV SOLN
10.00 | INTRAVENOUS | Status: DC
Start: 2019-11-17 — End: 2019-11-17

## 2019-11-17 MED ORDER — TRAZODONE HCL 50 MG PO TABS
50.00 | ORAL_TABLET | ORAL | Status: DC
Start: 2019-11-17 — End: 2019-11-17

## 2019-11-17 MED ORDER — TIZANIDINE HCL 4 MG PO TABS
4.00 | ORAL_TABLET | ORAL | Status: DC
Start: 2019-11-17 — End: 2019-11-17

## 2019-11-17 MED ORDER — HYDROCODONE-ACETAMINOPHEN 5-325 MG PO TABS
2.00 | ORAL_TABLET | ORAL | Status: DC
Start: ? — End: 2019-11-17

## 2019-11-17 MED ORDER — NORTRIPTYLINE HCL 10 MG PO CAPS
30.00 | ORAL_CAPSULE | ORAL | Status: DC
Start: 2019-11-17 — End: 2019-11-17

## 2019-11-17 MED ORDER — SENNOSIDES-DOCUSATE SODIUM 8.6-50 MG PO TABS
2.00 | ORAL_TABLET | ORAL | Status: DC
Start: 2019-11-17 — End: 2019-11-17

## 2019-11-17 MED ORDER — POLYETHYLENE GLYCOL 3350 17 GM/SCOOP PO POWD
17.00 | ORAL | Status: DC
Start: 2019-11-18 — End: 2019-11-17

## 2019-11-17 MED ORDER — SODIUM CHLORIDE FLUSH 0.9 % IV SOLN
10.00 | INTRAVENOUS | Status: DC
Start: ? — End: 2019-11-17

## 2019-11-17 MED ORDER — LISINOPRIL 20 MG PO TABS
40.00 | ORAL_TABLET | ORAL | Status: DC
Start: 2019-11-18 — End: 2019-11-17

## 2020-01-02 ENCOUNTER — Telehealth: Payer: Self-pay | Admitting: Neurology

## 2020-01-02 ENCOUNTER — Other Ambulatory Visit: Payer: Self-pay | Admitting: Neurology

## 2020-01-02 NOTE — Telephone Encounter (Signed)
I will call in a prescription for the hydrocodone.

## 2020-01-02 NOTE — Telephone Encounter (Signed)
Message sent to Dr. Anne Hahn for review.

## 2020-01-02 NOTE — Telephone Encounter (Signed)
Pt called again wanting to know when this will be called in for him.

## 2020-01-02 NOTE — Telephone Encounter (Signed)
1) Medication(s) Requested (by name): HYDROcodone-acetaminophen (NORCO) 7.5-325 MG tablet   2) Pharmacy of Choice: [facility] - [place], [place] - [address]  [address] [place] [address]    Pt states he is recovering from brain and abdominal surgery

## 2020-01-03 ENCOUNTER — Other Ambulatory Visit: Payer: Self-pay | Admitting: Neurology

## 2020-01-31 MED ORDER — GENERIC EXTERNAL MEDICATION
500.00 | Status: DC
Start: 2020-01-31 — End: 2020-01-31

## 2020-01-31 MED ORDER — TRAZODONE HCL 50 MG PO TABS
50.00 | ORAL_TABLET | ORAL | Status: DC
Start: 2020-01-31 — End: 2020-01-31

## 2020-01-31 MED ORDER — TIZANIDINE HCL 4 MG PO TABS
4.00 | ORAL_TABLET | ORAL | Status: DC
Start: 2020-01-31 — End: 2020-01-31

## 2020-01-31 MED ORDER — POLYETHYLENE GLYCOL 3350 17 GM/SCOOP PO POWD
17.00 | ORAL | Status: DC
Start: ? — End: 2020-01-31

## 2020-01-31 MED ORDER — GABAPENTIN 400 MG PO CAPS
400.00 | ORAL_CAPSULE | ORAL | Status: DC
Start: 2020-01-31 — End: 2020-01-31

## 2020-01-31 MED ORDER — NORTRIPTYLINE HCL 10 MG PO CAPS
30.00 | ORAL_CAPSULE | ORAL | Status: DC
Start: 2020-01-31 — End: 2020-01-31

## 2020-01-31 MED ORDER — FUROSEMIDE 20 MG PO TABS
40.00 | ORAL_TABLET | ORAL | Status: DC
Start: 2020-01-31 — End: 2020-01-31

## 2020-01-31 MED ORDER — LISINOPRIL 20 MG PO TABS
40.00 | ORAL_TABLET | ORAL | Status: DC
Start: 2020-02-01 — End: 2020-01-31

## 2020-02-06 MED ORDER — GABAPENTIN 400 MG PO CAPS
400.00 | ORAL_CAPSULE | ORAL | Status: DC
Start: 2020-02-06 — End: 2020-02-06

## 2020-02-06 MED ORDER — NORTRIPTYLINE HCL 10 MG PO CAPS
30.00 | ORAL_CAPSULE | ORAL | Status: DC
Start: 2020-02-06 — End: 2020-02-06

## 2020-02-06 MED ORDER — LISINOPRIL 20 MG PO TABS
40.00 | ORAL_TABLET | ORAL | Status: DC
Start: 2020-02-07 — End: 2020-02-06

## 2020-02-06 MED ORDER — TIZANIDINE HCL 4 MG PO TABS
4.00 | ORAL_TABLET | ORAL | Status: DC
Start: 2020-02-06 — End: 2020-02-06

## 2020-02-06 MED ORDER — TRAZODONE HCL 50 MG PO TABS
50.00 | ORAL_TABLET | ORAL | Status: DC
Start: 2020-02-06 — End: 2020-02-06

## 2020-02-06 MED ORDER — POLYETHYLENE GLYCOL 3350 17 GM/SCOOP PO POWD
17.00 | ORAL | Status: DC
Start: ? — End: 2020-02-06

## 2020-02-06 MED ORDER — LIQUIMAT 5 % EX LOTN
15.00 | TOPICAL_LOTION | CUTANEOUS | Status: DC
Start: ? — End: 2020-02-06

## 2020-02-06 MED ORDER — ENOXAPARIN SODIUM 40 MG/0.4ML ~~LOC~~ SOLN
40.00 | SUBCUTANEOUS | Status: DC
Start: 2020-02-07 — End: 2020-02-06

## 2020-02-06 MED ORDER — FUROSEMIDE 20 MG PO TABS
40.00 | ORAL_TABLET | ORAL | Status: DC
Start: 2020-02-06 — End: 2020-02-06

## 2020-02-12 ENCOUNTER — Other Ambulatory Visit: Payer: Self-pay | Admitting: Neurology

## 2020-02-23 ENCOUNTER — Telehealth: Payer: Self-pay | Admitting: Physician Assistant

## 2020-02-23 NOTE — Telephone Encounter (Signed)
I connected by phone with Caroll Rancher and/or patient's caregiver on 02/23/2020 at 8:21 PM to discuss the potential vaccination through our Homebound vaccination initiative.   Prevaccination Checklist for COVID-19 Vaccines  1.  Are you feeling sick today? no  2.  Have you ever received a dose of a COVID-19 vaccine?  no      If yes, which one? None   3.  Have you ever had an allergic reaction: (This would include a severe reaction [ e.g., anaphylaxis] that required treatment with epinephrine or EpiPen or that caused you to go to the hospital.  It would also include an allergic reaction that occurred within 4 hours that caused hives, swelling, or respiratory distress, including wheezing.) A.  A previous dose of COVID-19 vaccine. no  B.  A vaccine or injectable therapy that contains multiple components, one of which is a COVID-19 vaccine component, but it is not known which component elicited the immediate reaction. no  C.  Are you allergic to polyethylene glycol? no   4.  Have you ever had an allergic reaction to another vaccine (other than COVID-19 vaccine) or an injectable medication? (This would include a severe reaction [ e.g., anaphylaxis] that required treatment with epinephrine or EpiPen or that caused you to go to the hospital.  It would also include an allergic reaction that occurred within 4 hours that caused hives, swelling, or respiratory distress, including wheezing.)  no   5.  Have you ever had a severe allergic reaction (e.g., anaphylaxis) to something other than a component of the COVID-19 vaccine, or any vaccine or injectable medication?  This would include food, pet, venom, environmental, or oral medication allergies.  no   6.  Have you received any vaccine in the last 14 days? no   7.  Have you ever had a positive test for COVID-19 or has a doctor ever told you that you had COVID-19?  no   8.  Have you received passive antibody therapy (monoclonal antibodies or convalescent  serum) as a treatment for COVID-19? no   9.  Do you have a weakened immune system caused by something such as HIV infection or cancer or do you take immunosuppressive drugs or therapies?  no   10.  Do you have a bleeding disorder or are you taking a blood thinner? no   11.  Are you pregnant or breast-feeding? no   12.  Do you have dermal fillers? no   __________________   This patient is a 64 y.o. male that meets the FDA criteria to receive homebound vaccination. Patient or parent/caregiver understands they have the option to accept or refuse homebound vaccination.  Patient passed the pre-screening checklist and would like to proceed with homebound vaccination.  Based on questionnaire above, I recommend the patient be observed for 15 minutes.  There are no other household members/caregivers who are also interested in receiving the vaccine.    I will send the patient's information to our scheduling team who will reach out to schedule the patient and potential caregiver/family members for homebound vaccination.    Cline Crock 02/23/2020 8:21 PM

## 2020-03-04 ENCOUNTER — Emergency Department (HOSPITAL_COMMUNITY): Payer: Medicare Other

## 2020-03-04 ENCOUNTER — Encounter (HOSPITAL_COMMUNITY): Payer: Self-pay

## 2020-03-04 ENCOUNTER — Emergency Department (HOSPITAL_COMMUNITY)
Admission: EM | Admit: 2020-03-04 | Discharge: 2020-03-04 | Disposition: A | Discharge status: Home / Self Care | Payer: Medicare Other | Attending: Emergency Medicine | Admitting: Emergency Medicine

## 2020-03-04 DIAGNOSIS — R519 Headache, unspecified: Secondary | ICD-10-CM

## 2020-03-04 DIAGNOSIS — I5032 Chronic diastolic (congestive) heart failure: Secondary | ICD-10-CM | POA: Diagnosis not present

## 2020-03-04 DIAGNOSIS — Z7982 Long term (current) use of aspirin: Secondary | ICD-10-CM | POA: Diagnosis not present

## 2020-03-04 DIAGNOSIS — Z79899 Other long term (current) drug therapy: Secondary | ICD-10-CM | POA: Diagnosis not present

## 2020-03-04 DIAGNOSIS — I10 Essential (primary) hypertension: Secondary | ICD-10-CM

## 2020-03-04 DIAGNOSIS — I110 Hypertensive heart disease with heart failure: Secondary | ICD-10-CM | POA: Diagnosis not present

## 2020-03-04 DIAGNOSIS — R791 Abnormal coagulation profile: Secondary | ICD-10-CM | POA: Insufficient documentation

## 2020-03-04 LAB — I-STAT CHEM 8, ED
BUN: 22 mg/dL (ref 8–23)
Calcium, Ion: 1.1 mmol/L — ABNORMAL LOW (ref 1.15–1.40)
Chloride: 99 mmol/L (ref 98–111)
Creatinine, Ser: 0.8 mg/dL (ref 0.61–1.24)
Glucose, Bld: 92 mg/dL (ref 70–99)
HCT: 47 % (ref 39.0–52.0)
Hemoglobin: 16 g/dL (ref 13.0–17.0)
Potassium: 4 mmol/L (ref 3.5–5.1)
Sodium: 140 mmol/L (ref 135–145)
TCO2: 35 mmol/L — ABNORMAL HIGH (ref 22–32)

## 2020-03-04 LAB — DIFFERENTIAL
Abs Immature Granulocytes: 0.07 10*3/uL (ref 0.00–0.07)
Basophils Absolute: 0.1 10*3/uL (ref 0.0–0.1)
Basophils Relative: 1 %
Eosinophils Absolute: 0.5 10*3/uL (ref 0.0–0.5)
Eosinophils Relative: 10 %
Immature Granulocytes: 1 %
Lymphocytes Relative: 24 %
Lymphs Abs: 1.2 10*3/uL (ref 0.7–4.0)
Monocytes Absolute: 0.6 10*3/uL (ref 0.1–1.0)
Monocytes Relative: 12 %
Neutro Abs: 2.6 10*3/uL (ref 1.7–7.7)
Neutrophils Relative %: 52 %

## 2020-03-04 LAB — PROTIME-INR
INR: 1.1 (ref 0.8–1.2)
Prothrombin Time: 13.3 seconds (ref 11.4–15.2)

## 2020-03-04 LAB — COMPREHENSIVE METABOLIC PANEL
ALT: 29 U/L (ref 0–44)
AST: 19 U/L (ref 15–41)
Albumin: 4 g/dL (ref 3.5–5.0)
Alkaline Phosphatase: 53 U/L (ref 38–126)
Anion gap: 8 (ref 5–15)
BUN: 18 mg/dL (ref 8–23)
CO2: 31 mmol/L (ref 22–32)
Calcium: 8.8 mg/dL — ABNORMAL LOW (ref 8.9–10.3)
Chloride: 99 mmol/L (ref 98–111)
Creatinine, Ser: 0.86 mg/dL (ref 0.61–1.24)
GFR calc Af Amer: >60 mL/min (ref >60)
GFR calc non Af Amer: >60 mL/min (ref >60)
Glucose, Bld: 96 mg/dL (ref 70–99)
Potassium: 4.1 mmol/L (ref 3.5–5.1)
Sodium: 138 mmol/L (ref 135–145)
Total Bilirubin: 0.6 mg/dL (ref 0.3–1.2)
Total Protein: 8 g/dL (ref 6.5–8.1)

## 2020-03-04 LAB — APTT: aPTT: 26 seconds (ref 24–36)

## 2020-03-04 LAB — CBC
HCT: 47.5 % (ref 39.0–52.0)
Hemoglobin: 15.5 g/dL (ref 13.0–17.0)
MCH: 32.4 pg (ref 26.0–34.0)
MCHC: 32.6 g/dL (ref 30.0–36.0)
MCV: 99.2 fL (ref 80.0–100.0)
Platelets: 196 10*3/uL (ref 150–400)
RBC: 4.79 MIL/uL (ref 4.22–5.81)
RDW: 12.6 % (ref 11.5–15.5)
WBC: 5 10*3/uL (ref 4.0–10.5)
nRBC: 0 % (ref 0.0–0.2)

## 2020-03-04 MED ORDER — SODIUM CHLORIDE 0.9% FLUSH: 3.0000 mL | Freq: Once | INTRAVENOUS | Status: DC

## 2020-03-04 NOTE — Discharge Instructions (Addendum)
You were seen in the emergency department for evaluation of a worsening headache and elevated blood pressure.  Your lab work was normal but your CAT scan showed that you are likely over shunted.  Your subdural seems improved.  We discussed this with neurosurgery here and they recommend that you call your neurosurgeon tomorrow for evaluation and having your shunt reprogrammed.  Return to the emergency department if any worsening or concerning symptoms

## 2020-03-04 NOTE — ED Triage Notes (Signed)
Pt arrives to ED w/ c/o intermittent headache x 2 days 10/10 pain right sided. Pt has shunt on R side of head. Pt hypertensive w/ EMS 177/100. All other VSS.

## 2020-03-04 NOTE — ED Provider Notes (Signed)
[facility] EMERGENCY DEPARTMENT Provider Note   CSN: [id number] Arrival date & time: 03/04/20  1815     History Chief Complaint  Patient presents with  . Headache    [name] is a 64 y.o. male.  He is a history of hydrocephalus and has a VP shunt.  It sounds like in April he had a fall and went to [facility] where they found that he had a subdural hematoma.  He did not have operative intervention but it does sound like neurosurgery adjusted his shunt.  He has been home getting physical and occupational therapy.  He said his blood pressure was very low this morning and then very high.  He has had 2 or 3 days of significant headache on the right side sometimes associated with some blurry vision.  He currently rates it as 9 out of 10.  No other new neurologic signs.  The history is provided by the patient.  Headache Pain location:  R temporal Quality:  Unable to specify Radiates to:  Does not radiate Severity currently:  9/10 Severity at highest:  10/10 Onset quality:  Gradual Timing:  Intermittent Progression:  Unchanged Chronicity:  New Relieved by:  Nothing Worsened by:  Nothing Ineffective treatments:  None tried Associated symptoms: blurred vision, dizziness and numbness (BLE chronic)   Associated symptoms: no abdominal pain, no cough, no eye pain, no fever, no neck pain and no sore throat        Past Medical History:  Diagnosis Date  . Cataracts, bilateral   . Chronic insomnia   . Chronic low back pain   . Gait disorder   . History of headache   . History of melanoma   . Hydrocephalus (HCC)   . Hypertension   . Obesity   . Obstructive sleep apnea on CPAP   . Pancreatitis    Secondary to gallstones  . Pancreatitis due to biliary obstruction   . Peripheral edema    Chronic venous stasis  . Peripheral neuropathy     Patient Active Problem List   Diagnosis Date Noted  . Hypotension   . Fall   . Severe sepsis (HCC) 03/14/2019  .  Varicose veins of bilateral lower extremities with other complications 05/23/2017  . Cellulitis of lower extremity   . Sepsis due to cellulitis (HCC) 03/21/2017  . Essential hypertension 03/21/2017  . Chronic diastolic CHF (congestive heart failure) (HCC) 03/21/2017  . Renal insufficiency 03/21/2017  . Meralgia paresthetica 10/12/2013  . Polyneuropathy in other diseases classified elsewhere (HCC) 11/22/2012  . Obstructive sleep apnea (adult) (pediatric) 11/22/2012  . Abnormality of gait 11/22/2012  . Headache(784.0) 11/22/2012  . Pain in limb 11/22/2012  . Lumbago 11/22/2012  . Cervicalgia 11/22/2012  . Spinal stenosis in cervical region 11/22/2012  . Degeneration of intervertebral disc, site unspecified 11/22/2012  . Degeneration of cervical intervertebral disc 11/22/2012  . Obstructive hydrocephalus (HCC) 11/22/2012    Past Surgical History:  Procedure Laterality Date  . CARPAL TUNNEL RELEASE Bilateral   . CHOLECYSTECTOMY    . ENDOVENOUS ABLATION SAPHENOUS VEIN W/ LASER Left 06/01/2017   endovenous laser ablation left greater saphenous vein by Josephina Gip MD   . ENDOVENOUS ABLATION SAPHENOUS VEIN W/ LASER Right 06/27/2017   endovenous laser ablation right GSV  by Josephina Gip MD  . INGUINAL HERNIA REPAIR    . LUMBAR SPINE SURGERY    . TONSILLECTOMY         Family History  Problem Relation Age  of Onset  . Cancer Mother        Liver cancer  . Cancer Father        brain cancer  . Cancer Maternal Grandmother        Liver cancer  . Heart attack Maternal Grandfather     Social History   Tobacco Use  . Smoking status: Never Smoker  . Smokeless tobacco: Never Used  Substance Use Topics  . Alcohol use: Yes    Alcohol/week: 0.0 standard drinks    Comment: Consumes alcohol on occasion  . Drug use: No    Home Medications Prior to Admission medications   Medication Sig Start Date End Date Taking? Authorizing Provider  aspirin 81 MG tablet Take 81 mg by mouth daily.     [provider]  atorvastatin (LIPITOR) 20 MG tablet Take 20 mg by mouth daily.  10/13/14   [provider]  furosemide (LASIX) 40 MG tablet Take 40 mg by mouth 2 (two) times daily.     [provider]  gabapentin (NEURONTIN) 300 MG capsule Take 1 capsule (300 mg total) by mouth 3 (three) times daily. Patient taking differently: Take 900 mg by mouth 4 (four) times daily.  02/29/16   Kathrynn Ducking, MD  hydrocerin (EUCERIN) CREA Apply 1 application topically daily. 03/30/17   Regalado, Belkys A, MD  HYDROcodone-acetaminophen (NORCO) 7.5-325 MG tablet TAKE 1 TABLET BY MOUTH EVERY 6 HOURS AS NEEDED FOR MODERATE PAIN (MUST LAST 28 DAYS). 02/13/20   Kathrynn Ducking, MD  mupirocin cream (BACTROBAN) 2 % Apply topically 2 (two) times daily. 03/16/19   Benay Pike, MD  nortriptyline (PAMELOR) 10 MG capsule TAKE 3 CAPSULES (30 MG TOTAL) BY MOUTH EVERY EVENING. Patient taking differently: Take 30 mg by mouth at bedtime.  04/11/15   Kathrynn Ducking, MD  ramipril (ALTACE) 10 MG capsule Take 10 mg by mouth 2 (two) times daily.    [provider]  ropinirole (REQUIP) 5 MG tablet Take 5 mg by mouth daily.     [provider]  tiZANidine (ZANAFLEX) 4 MG tablet TAKE 1 TABLET BY MOUTH 3  TIMES DAILY 01/03/20   Kathrynn Ducking, MD  traZODone (DESYREL) 50 MG tablet Take 1 tablet by mouth at bedtime. 03/19/14   [provider]    Allergies    Vancomycin  Review of Systems   Review of Systems  Constitutional: Negative for fever.  HENT: Negative for sore throat.   Eyes: Positive for blurred vision. Negative for pain.  Respiratory: Negative for cough and shortness of breath.   Cardiovascular: Negative for chest pain.  Gastrointestinal: Negative for abdominal pain.  Genitourinary: Negative for dysuria.  Musculoskeletal: Negative for neck pain.  Skin: Negative for rash.  Neurological: Positive for dizziness, numbness (BLE chronic) and headaches.     Physical Exam Updated Vital Signs BP (!) 155/75 (BP Location: Right Arm)   Pulse 80   Temp 98.5 F (36.9 C) (Oral)   Resp 18   Ht 6\' 1"  (1.854 m)   Wt 131.5 kg   SpO2 100%   BMI 38.26 kg/m   Physical Exam Vitals and nursing note reviewed.  Constitutional:      Appearance: He is well-developed.  HENT:     Head: Normocephalic and atraumatic.  Eyes:     Conjunctiva/sclera: Conjunctivae normal.  Cardiovascular:     Rate and Rhythm: Normal rate and regular rhythm.     Heart sounds: No murmur.  Pulmonary:  Effort: Pulmonary effort is normal. No respiratory distress.     Breath sounds: Normal breath sounds.  Abdominal:     Palpations: Abdomen is soft.     Tenderness: There is no abdominal tenderness.  Musculoskeletal:     Cervical back: Neck supple.     Right lower leg: Edema present.     Left lower leg: Edema present.  Skin:    General: Skin is warm and dry.     Capillary Refill: Capillary refill takes less than 2 seconds.  Neurological:     Mental Status: He is alert and oriented to person, place, and time.     GCS: GCS eye subscore is 4. GCS verbal subscore is 5. GCS motor subscore is 6.     Cranial Nerves: No cranial nerve deficit.     ED Results / Procedures / Treatments   Labs (all labs ordered are listed, but only abnormal results are displayed) Labs Reviewed  COMPREHENSIVE METABOLIC PANEL - Abnormal; Notable for the following components:      Result Value   Calcium 8.8 (*)    All other components within normal limits  I-STAT CHEM 8, ED - Abnormal; Notable for the following components:   Calcium, Ion 1.10 (*)    TCO2 35 (*)    All other components within normal limits  PROTIME-INR  APTT  CBC  DIFFERENTIAL  CBG MONITORING, ED    EKG EKG Interpretation  Date/Time:  Tuesday Mar 04 2020 18:13:35 EDT Ventricular Rate:  84 PR Interval:  168 QRS Duration: 88 QT Interval:  368 QTC Calculation: 434 R Axis:   51 Text Interpretation: Normal sinus  rhythm with sinus arrhythmia Normal ECG No significant change since prior 5/20 Confirmed by Meridee Score [phone]) on 03/04/2020 7:38:50 PM   Radiology CT HEAD WO CONTRAST  Result Date: 03/04/2020 CLINICAL DATA:  64 year old male with ventriculomegaly discovered last year, ventriculostomy shunt since that time. Recent fall. Headache for 2 days with severe pain on the right. Hypertensive at presentation (systolic 177). EXAM: CT HEAD WITHOUT CONTRAST TECHNIQUE: Contiguous axial images were obtained from the base of the skull through the vertex without intravenous contrast. COMPARISON:  Preoperative head CT 08/31/2019 and earlier. FINDINGS: Brain: New right posterior approach ventriculostomy shunt since November, passes through the right lateral ventricle and terminates within the left frontal horn. Completely decompressed lateral and 3rd ventricles now. There is mildly increased subarachnoid CSF along the dorsal corpus callosum which appears mildly dysplastic. Basilar cisterns have normalized. But furthermore there is a mixed density left side subdural hematoma measuring up to 5 mm in thickness. No midline shift. No other intracranial blood identified. There is a small area of chronic dystrophic calcification in the right parietal lobe. No definite cortical encephalomalacia. No cortically based acute infarct identified. Vascular: Mild intracranial artery tortuosity. No suspicious intracranial vascular hyperdensity. Skull: Right posterior convexity burr hole. Otherwise stable and negative. Sinuses/Orbits: Visualized paranasal sinuses and mastoids are stable and well pneumatized. Other: Negative orbits, postoperative changes to both globes again noted. Right posterior scalp convexity shunt reservoir and tubing with no adverse features. IMPRESSION: 1. Right posterior approach CSF shunt placed since November with evidence of Over-shunting: completely decompressed lateral and 3rd ventricles now with a new 5 mm mixed  density Left Side Subdural Hematoma. 2. No midline shift. Normal basilar cisterns. No other acute intracranial abnormality. Electronically Signed   By: Odessa Fleming M.D.   On: 03/04/2020 19:05    Procedures Procedures (including critical care time)  Medications  Ordered in ED Medications  sodium chloride flush (NS) 0.9 % injection 3 mL (has no administration in time range)    ED Course  I have reviewed the triage vital signs and the nursing notes.  Pertinent labs & imaging results that were available during my care of the patient were reviewed by me and considered in my medical decision making (see chart for details).  Clinical Course as of Mar 05 814  Tue Mar 04, 2020  2015 Discussed with neurosurgery PA Waymon Budge who reviewed the case with Dr. Conchita Paris.  Feel this is likely due to being over shunted and would recommend patient refer [facility] for adjustment.  No acute intervention required.  The subdural is likely improved from priors 3 weeks ago   [MB]    Clinical Course User Index [MB] Terrilee Files, MD   MDM Rules/Calculators/A&P                     This patient complains of headache; this involves an extensive number of treatment Options and is a complaint that carries with it a high risk of complications and Morbidity. The differential includes tension headache, hypertensive urgency, worsening subdural shock malfunction  I ordered, reviewed and interpreted labs, which included normal CBC, normal chemistry other than slightly low calcium I ordered imaging studies which included CT head and I independently    visualized and interpreted imaging which showed continued subdural improved from prior, small ventricles Previous records obtained and reviewed in epic including last admission to [facility] I consulted neurosurgery Dr. Conchita Paris and discussed lab and imaging findings  Critical Interventions: None  After the interventions stated above, I reevaluated the  patient and found patient to be neurologically stable.  I reviewed the recommendations of neurosurgery with him and he is comfortable with plan.  We will reach out to his neurosurgeons tomorrow for possible shunt adjustment.  Final Clinical Impression(s) / ED Diagnoses Final diagnoses:  Bad headache  Essential hypertension    Rx / DC Orders ED Discharge Orders    None       Terrilee Files, MD 03/05/20 [phone]

## 2020-03-12 ENCOUNTER — Encounter: Payer: Self-pay | Admitting: Neurology

## 2020-03-12 ENCOUNTER — Ambulatory Visit: Payer: Medicare Other | Admitting: Neurology

## 2020-03-12 ENCOUNTER — Other Ambulatory Visit: Payer: Self-pay

## 2020-03-12 VITALS — BP 112/69 | HR 90 | Ht 73.0 in | Wt 330.0 lb

## 2020-03-12 DIAGNOSIS — S065X9A Traumatic subdural hemorrhage with loss of consciousness of unspecified duration, initial encounter: Secondary | ICD-10-CM | POA: Diagnosis not present

## 2020-03-12 DIAGNOSIS — G911 Obstructive hydrocephalus: Secondary | ICD-10-CM

## 2020-03-12 DIAGNOSIS — R269 Unspecified abnormalities of gait and mobility: Secondary | ICD-10-CM

## 2020-03-12 DIAGNOSIS — G63 Polyneuropathy in diseases classified elsewhere: Secondary | ICD-10-CM

## 2020-03-12 DIAGNOSIS — S065XAA Traumatic subdural hemorrhage with loss of consciousness status unknown, initial encounter: Secondary | ICD-10-CM

## 2020-03-12 HISTORY — DX: Traumatic subdural hemorrhage with loss of consciousness of unspecified duration, initial encounter: S06.5X9A

## 2020-03-12 HISTORY — DX: Traumatic subdural hemorrhage with loss of consciousness status unknown, initial encounter: S06.5XAA

## 2020-03-12 NOTE — Progress Notes (Signed)
Reason for visit: Obstructive hydrocephalus, gait disorder, peripheral neuropathy  [name] is an 64 y.o. male  History of present illness:  Mr. [name] is a 64 year old right-handed white male with a history of a peripheral neuropathy, severe peripheral edema, and ventriculomegaly associated with a subacute decompensation of his ability to walk.  The patient was eventually seen through [facility] and underwent a VP shunt through Dr. Angelyn Punt.  The patient had adjustments in the pressure, but unfortunately he had a fall several months ago striking the right side of his head.  Following this, he developed a subdural hematoma.  The pressure setting has been adjusted, he has a small subdural over the left hemisphere of the brain.  He recently to the emergency room on 04 Mar 2020 with increased headache, the subdural was still present, but had gotten smaller over time.  He is following up with Dr. Angelyn Punt on a regular basis.  He is getting physical therapy at home, he believes that his ability to walk has improved some, he still uses a walker outside of the house and a cane inside the house.  He has not had any further falls.  Past Medical History:  Diagnosis Date  . Cataracts, bilateral   . Chronic insomnia   . Chronic low back pain   . Gait disorder   . History of headache   . History of melanoma   . Hydrocephalus (HCC)   . Hypertension   . Obesity   . Obstructive sleep apnea on CPAP   . Pancreatitis    Secondary to gallstones  . Pancreatitis due to biliary obstruction   . Peripheral edema    Chronic venous stasis  . Peripheral neuropathy     Past Surgical History:  Procedure Laterality Date  . CARPAL TUNNEL RELEASE Bilateral   . CHOLECYSTECTOMY    . ENDOVENOUS ABLATION SAPHENOUS VEIN W/ LASER Left 06/01/2017   endovenous laser ablation left greater saphenous vein by Josephina Gip MD   . ENDOVENOUS ABLATION SAPHENOUS VEIN W/ LASER Right 06/27/2017   endovenous laser ablation right GSV   by Josephina Gip MD  . INGUINAL HERNIA REPAIR    . LUMBAR SPINE SURGERY    . TONSILLECTOMY      Family History  Problem Relation Age of Onset  . Cancer Mother        Liver cancer  . Cancer Father        brain cancer  . Cancer Maternal Grandmother        Liver cancer  . Heart attack Maternal Grandfather     Social history:  reports that he has never smoked. He has never used smokeless tobacco. He reports current alcohol use. He reports that he does not use drugs.    Allergies  Allergen Reactions  . Vancomycin Other (See Comments)    Arm began swelling and itching into infusion 5/27, no respiratory symptoms Arm began swelling and itching into infusion 5/27, no respiratory symptoms    Medications:  Prior to Admission medications   Medication Sig Start Date End Date Taking? Authorizing Provider  atorvastatin (LIPITOR) 20 MG tablet Take 20 mg by mouth daily.  10/13/14  Yes [provider]  furosemide (LASIX) 40 MG tablet Take 40 mg by mouth 2 (two) times daily.    Yes [provider]  gabapentin (NEURONTIN) 300 MG capsule Take 1 capsule (300 mg total) by mouth 3 (three) times daily. Patient taking differently: Take 900 mg by mouth 4 (  four) times daily.  02/29/16  Yes York Spaniel, MD  hydrocerin (EUCERIN) CREA Apply 1 application topically daily. 03/30/17  Yes Regalado, Belkys A, MD  HYDROcodone-acetaminophen (NORCO) 7.5-325 MG tablet TAKE 1 TABLET BY MOUTH EVERY 6 HOURS AS NEEDED FOR MODERATE PAIN (MUST LAST 28 DAYS). 02/13/20  Yes York Spaniel, MD  mupirocin cream (BACTROBAN) 2 % Apply topically 2 (two) times daily. 03/16/19  Yes Sandre Kitty, MD  nortriptyline (PAMELOR) 10 MG capsule TAKE 3 CAPSULES (30 MG TOTAL) BY MOUTH EVERY EVENING. Patient taking differently: Take 30 mg by mouth at bedtime.  04/11/15  Yes York Spaniel, MD  ramipril (ALTACE) 10 MG capsule Take 10 mg by mouth 2 (two) times daily.   Yes [provider]    ropinirole (REQUIP) 5 MG tablet Take 5 mg by mouth daily.    Yes [provider]  tiZANidine (ZANAFLEX) 4 MG tablet TAKE 1 TABLET BY MOUTH 3  TIMES DAILY 01/03/20  Yes York Spaniel, MD  traZODone (DESYREL) 50 MG tablet Take 1 tablet by mouth at bedtime. 03/19/14  Yes [provider]    ROS:  Out of a complete 14 system review of symptoms, the patient complains only of the following symptoms, and all other reviewed systems are negative.  Leg swelling Walking difficulty Numbness in the feet  Blood pressure 112/69, pulse 90, height 6\' 1"  (1.854 m), weight (!) 330 lb (149.7 kg).  Physical Exam  General: The patient is alert and cooperative at the time of the examination.  The patient is markedly obese.  Skin: 4+ edema in the lower extremities is seen bilaterally.   Neurologic Exam  Mental status: The patient is alert and oriented x 3 at the time of the examination. The patient has apparent normal recent and remote memory, with an apparently normal attention span and concentration ability.   Cranial nerves: Facial symmetry is present. Speech is normal, no aphasia or dysarthria is noted. Extraocular movements are full. Visual fields are full.  Motor: The patient has good strength in all 4 extremities.  Sensory examination: Soft touch sensation is symmetric on the face, arms, and legs, but the patient reports decreased sensation in the legs.  Coordination: The patient has good finger-nose-finger and heel-to-shin bilaterally.  Gait and station: The patient has a wide-based gait.  The patient is able to walk a short distance by himself.  With the walker, he has fairly good stability and good turns.  Romberg is negative, tandem gait was not attempted.  Reflexes: Deep tendon reflexes are symmetric, but are depressed.   CT head 03/04/20:  IMPRESSION: 1. Right posterior approach CSF shunt placed since November with evidence of Over-shunting: completely decompressed  lateral and 3rd ventricles now with a new 5 mm mixed density Left Side Subdural Hematoma.  2. No midline shift. Normal basilar cisterns. No other acute intracranial abnormality.  * CT scan images were reviewed online. I agree with the written report.    Assessment/Plan:  1.  Peripheral neuropathy  2.  Chronic gait disorder  3.  Ventriculomegaly, status post VP shunt  4.  Severe peripheral edema  The patient appears to be doing some better with his walking.  He has multiple issues resulting in problems with walking, clearly, his hydrocephalus has been corrected, his ventricular size now is essentially normal.  His gait problems are in part related to his severe peripheral neuropathy.  The patient appears to have had a subdural hematoma on the left following  a fall with a bump to the head.  The subdural is probably not completely related to over decompression with a VP shunt.  The patient will follow up here in 6 months.  We will follow up with Dr. Salomon Fick as well.  Jill Alexanders MD 03/12/2020 10:21 AM  [facility] [address] [place], [place] [address]  Phone [phone] Fax [phone]

## 2020-03-24 ENCOUNTER — Other Ambulatory Visit: Payer: Self-pay | Admitting: Neurology

## 2020-03-24 NOTE — Telephone Encounter (Signed)
Drug registry check last refill [id number]. Refill request.

## 2020-04-17 DEATH — deceased

## 2020-11-25 IMAGING — CR CHEST - 2 VIEW
3 series · 3 of 3 positions shown · non-contrast
Comparison: 05/12/2017

CLINICAL DATA: 62-year-old male with left-sided chest pain

EXAM:
CHEST - 2 VIEW

[w chest lat (1 of 2)]
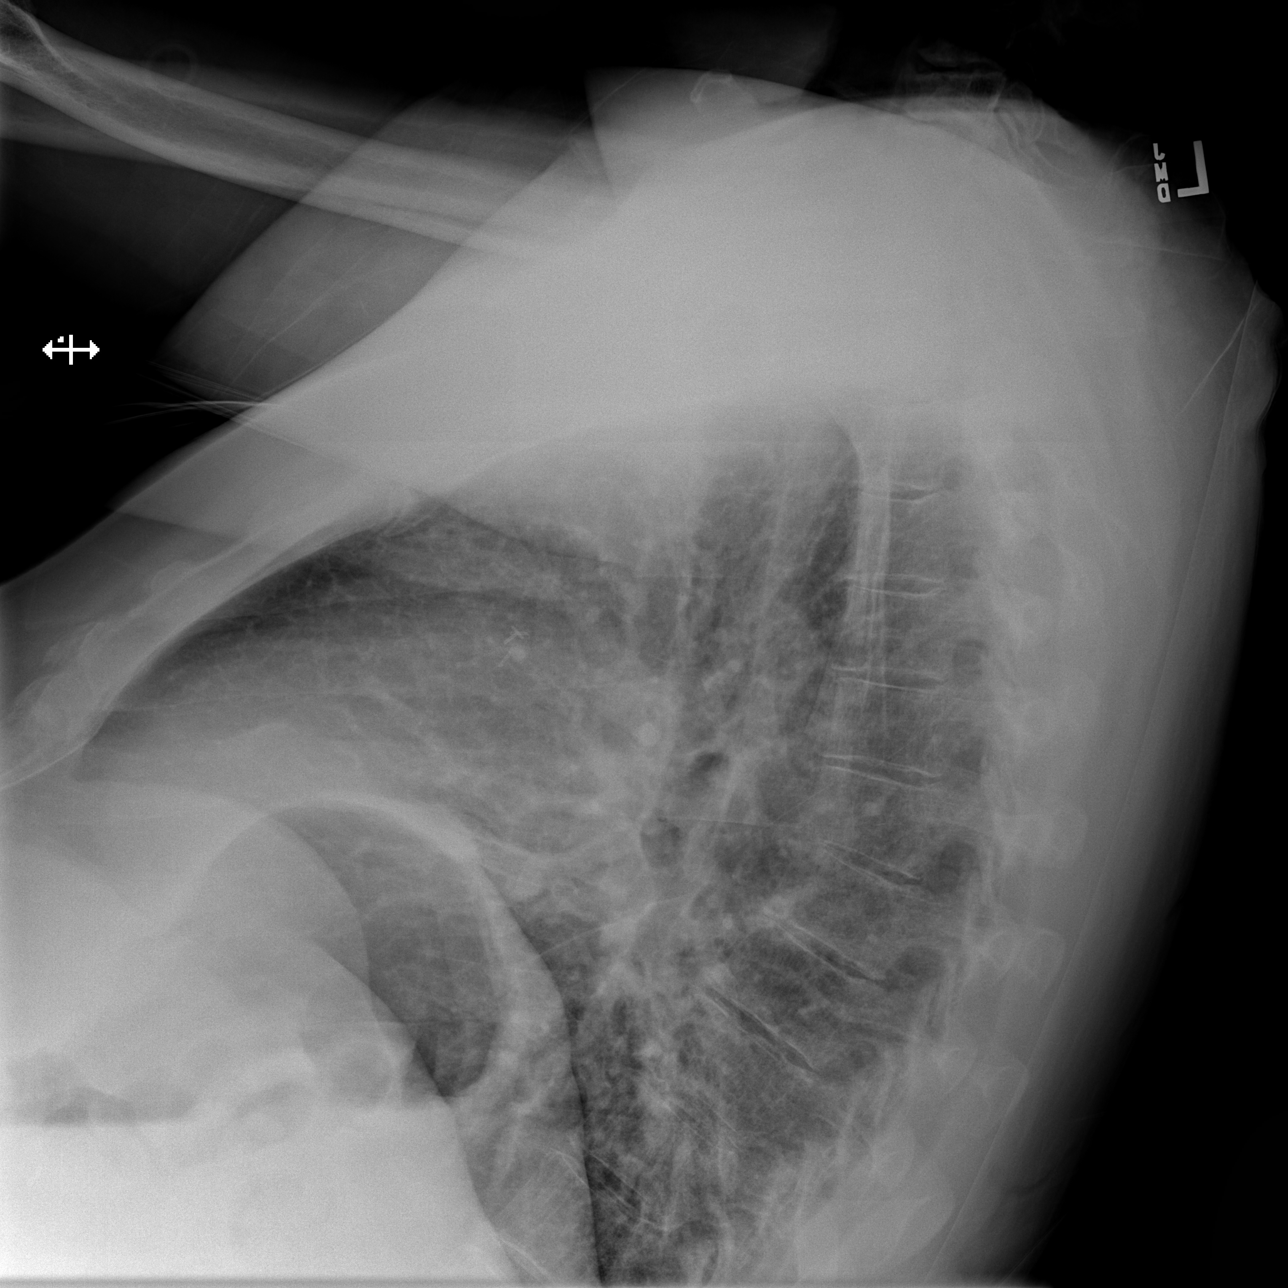

[w chest lat (2 of 2)]
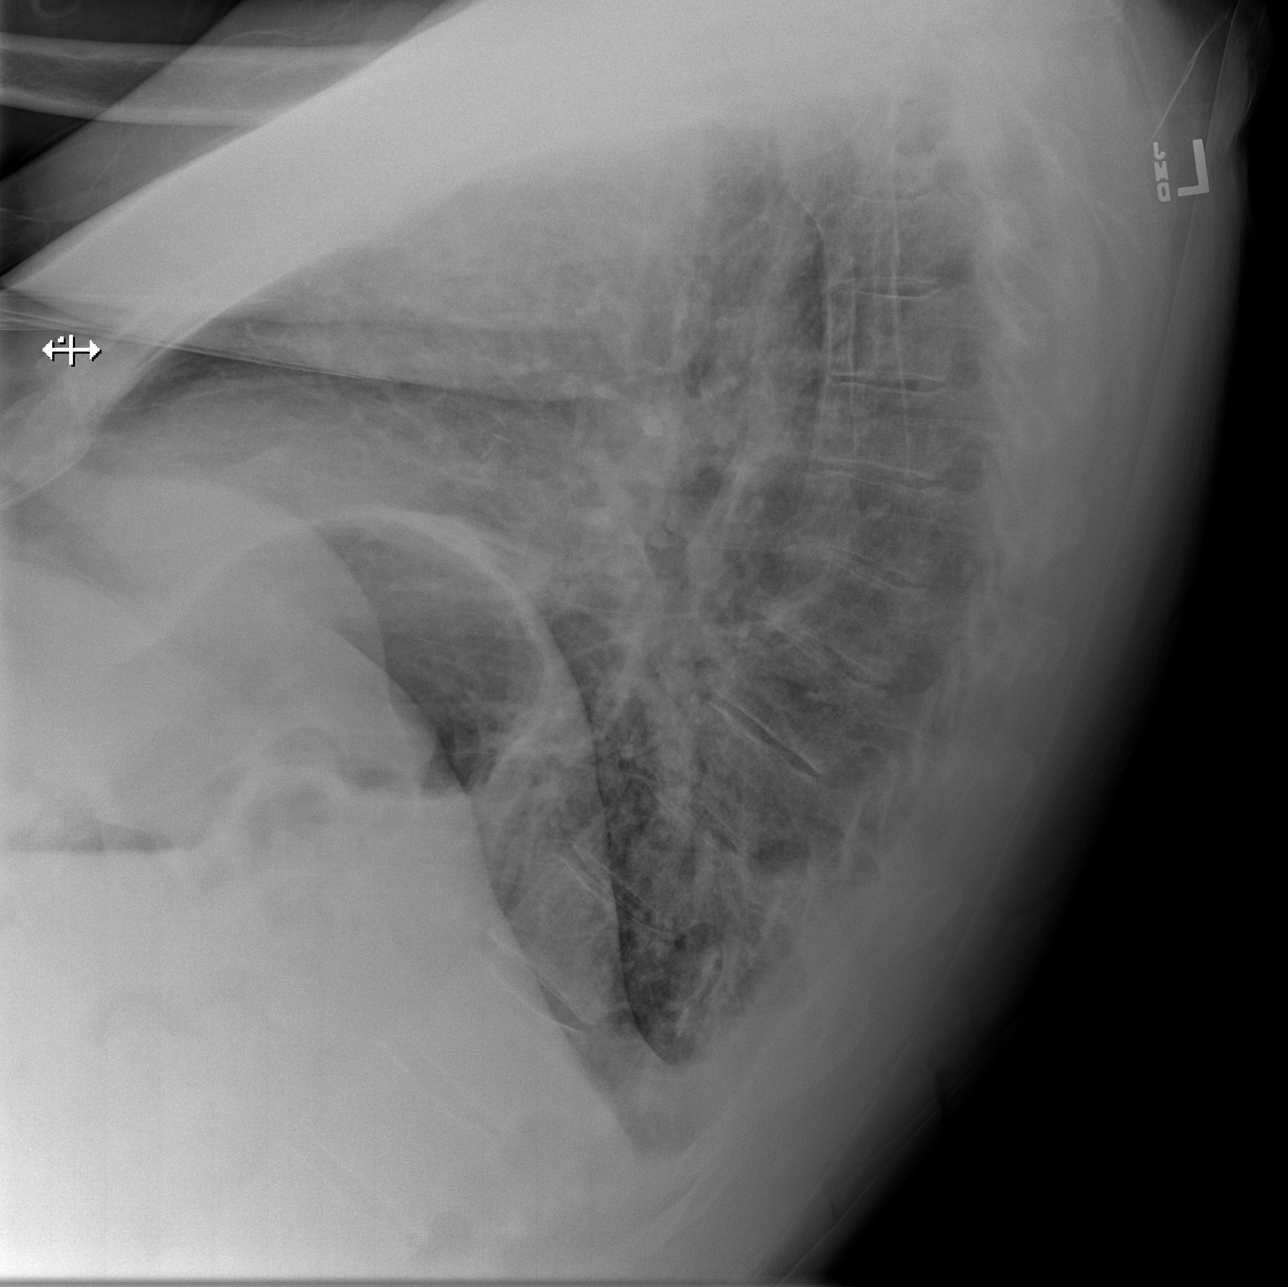

[x chest ap]
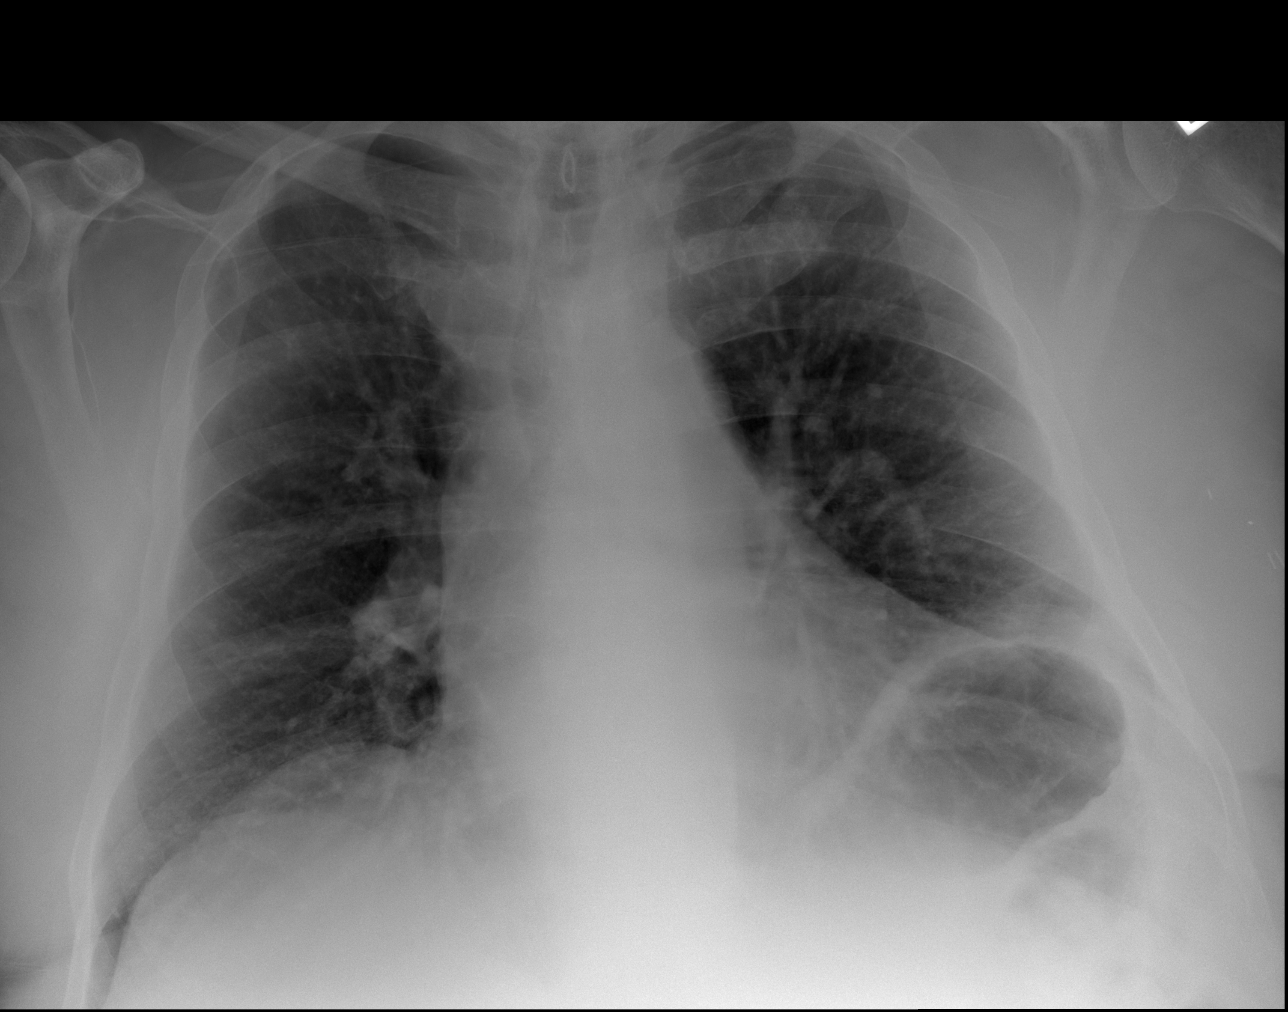

[3 of 3 positions shown; findings below may reference images not displayed]

FINDINGS: Cardiomediastinal silhouette unchanged in size and contour. No
evidence of interlobular septal thickening.

Asymmetric elevation of the left hemidiaphragm.

Blunting of the costophrenic sulcus on the lateral view. No
pneumothorax. No confluent airspace disease. Similar appearance of
chronic interstitial opacities.
IMPRESSION: Chronic lung changes with questionable tiny pleural effusions and no
evidence of overt edema.

## 2020-12-14 IMAGING — CT CT HEAD WITHOUT CONTRAST
3 of 5 series · 15 of 47 positions shown, 18 images · non-contrast
Comparison: None.

CLINICAL DATA: Trauma

EXAM:
CT HEAD WITHOUT CONTRAST
CT CERVICAL SPINE WITHOUT CONTRAST
TECHNIQUE: Multidetector CT imaging of the head and cervical spine was
performed following the standard protocol without intravenous
contrast. Multiplanar CT image reconstructions of the cervical spine
were also generated.

[Series 3: head 5.0 h30s · axial · 0.44mm/px · z∈[-106,+29]mm · 9 of 34 slices shown, 12 images]
[im 4/34  brain]
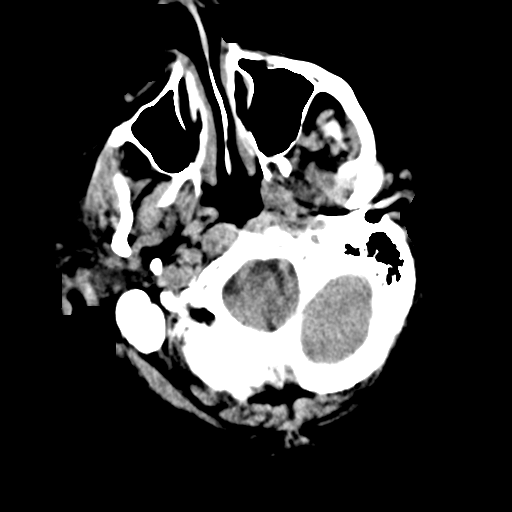
[im 4/34  bone]
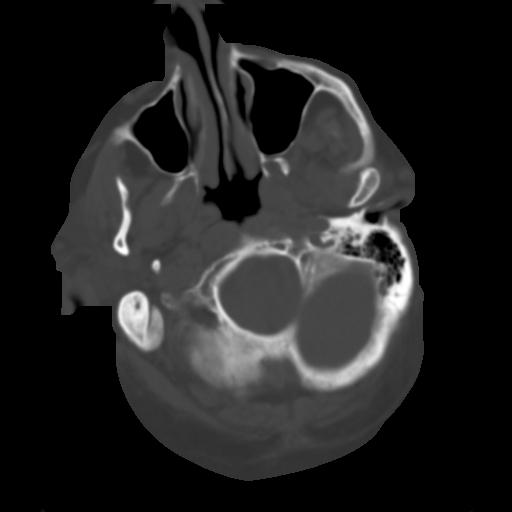
[im 7/34  brain]
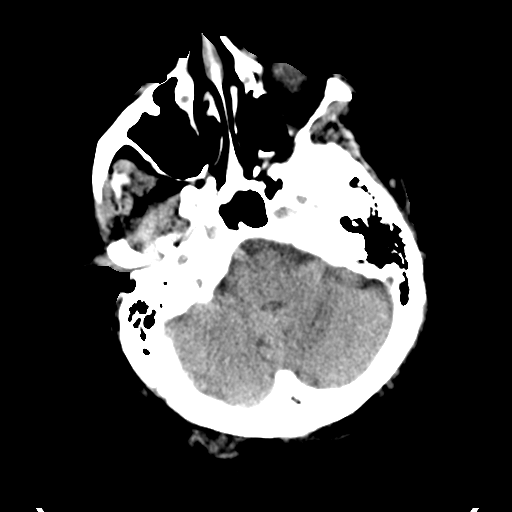
[im 10/34  brain]
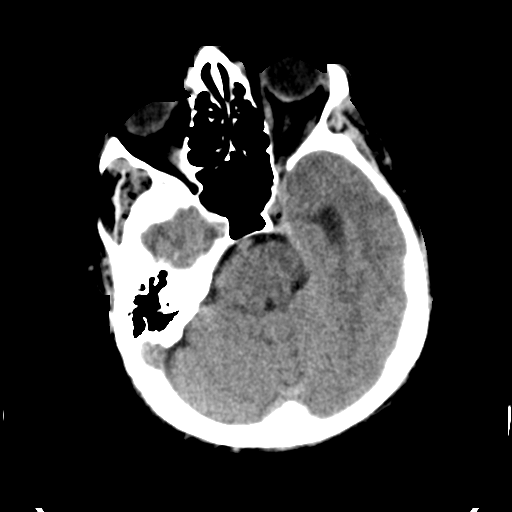
[im 13/34  brain]
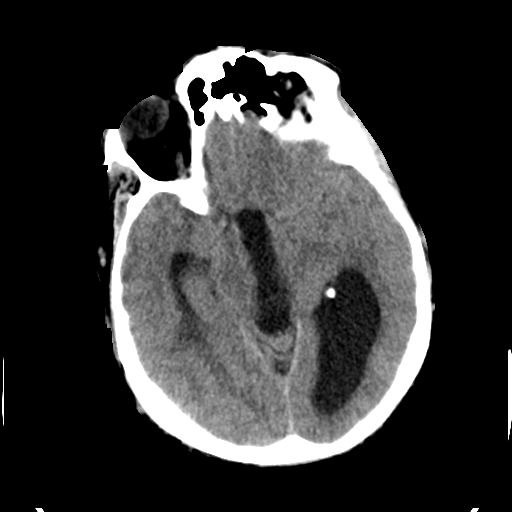
[im 19/34  brain]
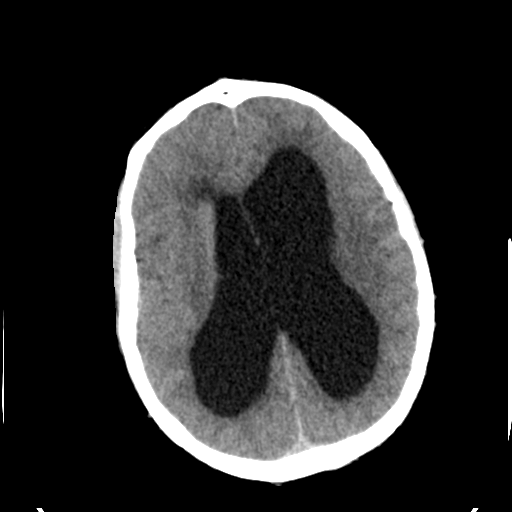
[im 19/34  bone]
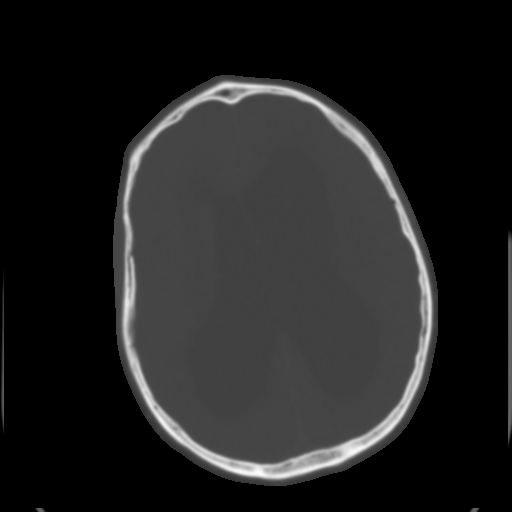
[im 22/34  brain]
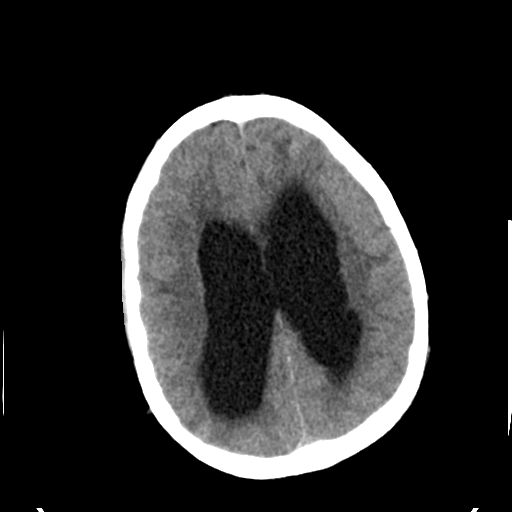
[im 25/34  brain]
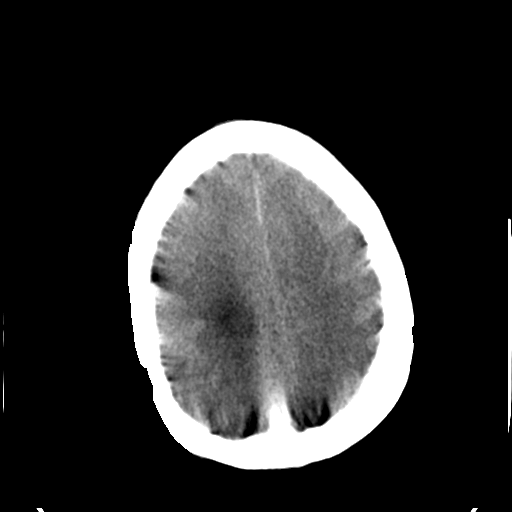
[im 28/34  brain]
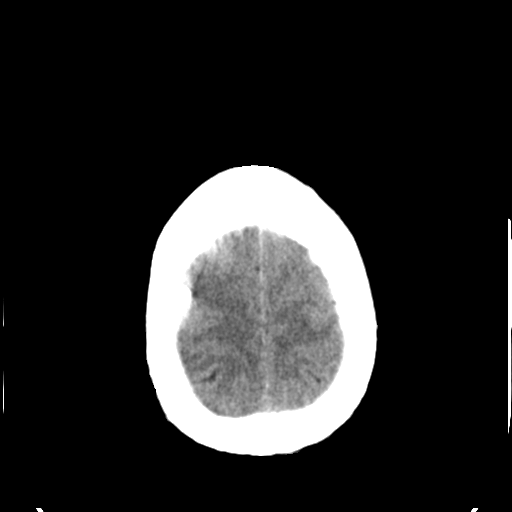
[im 31/34  brain]
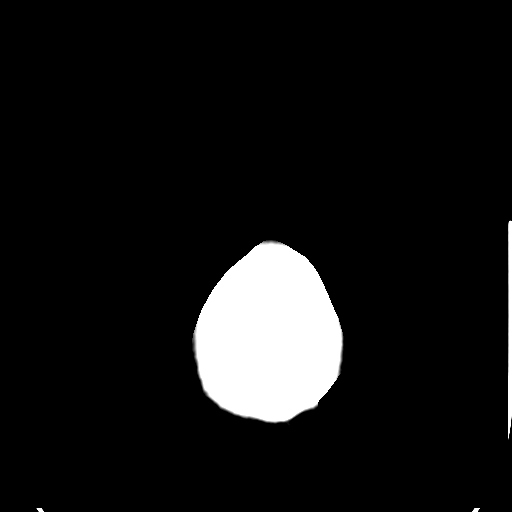
[im 31/34  bone]
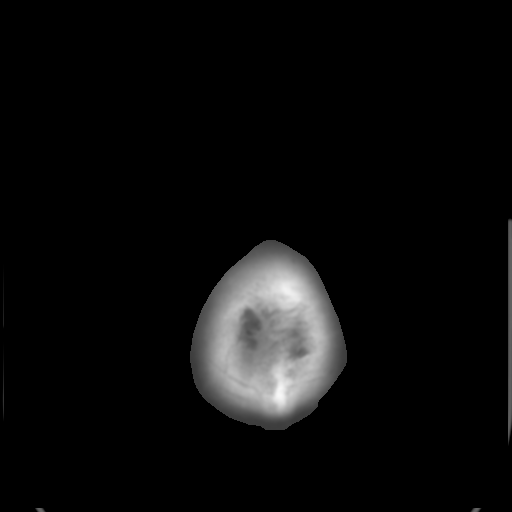

[Series 5: head 3.0 mpr cor · coronal · 0.32mm/px · 3 of 69 slices shown]
[im 23/69  brain]
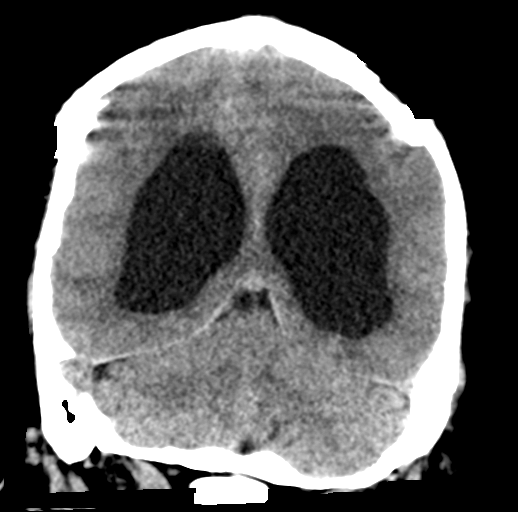
[im 31/69  brain]
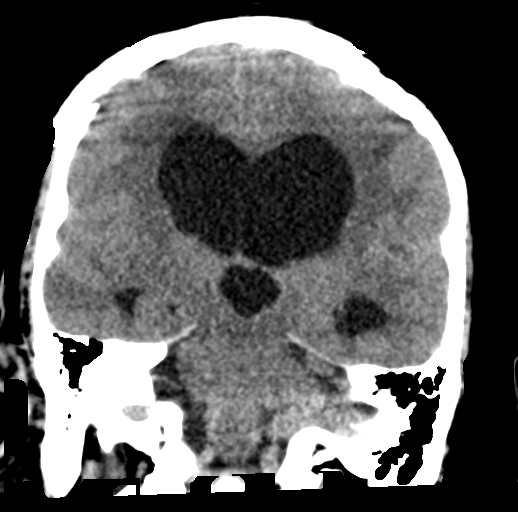
[im 38/69  brain]
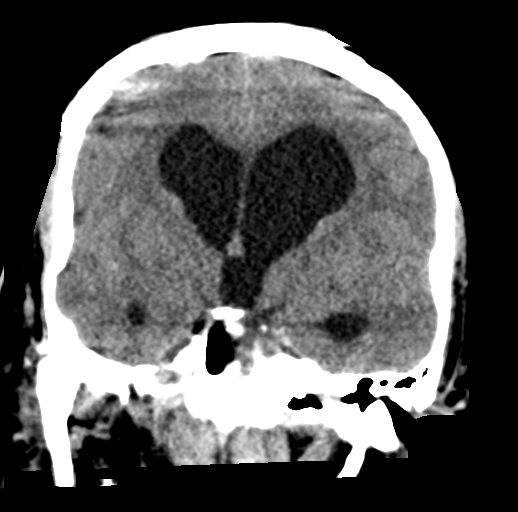

[Series 6: head 3.0 mpr sag · sagittal · 0.32mm/px · 3 of 58 slices shown]
[im 20/58  brain]
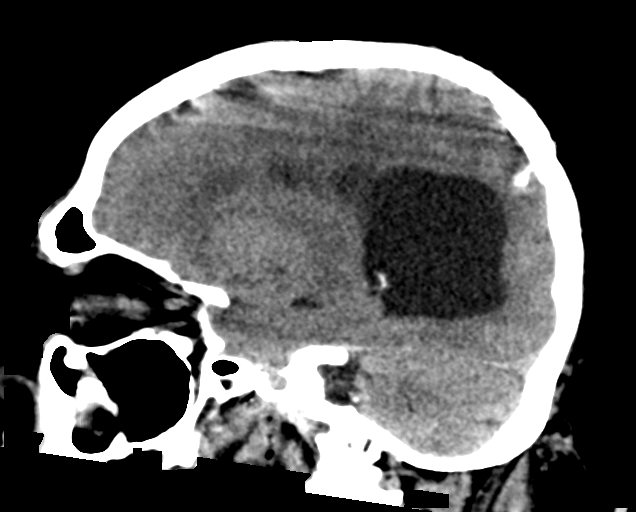
[im 29/58  brain]
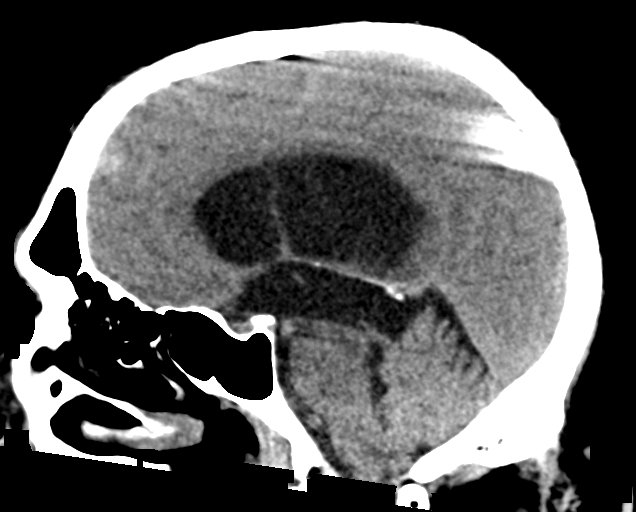
[im 39/58  brain]
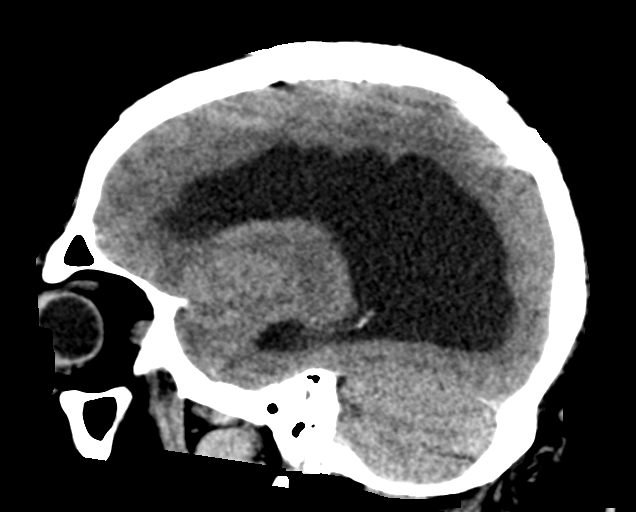

[15 of 47 positions shown; findings below may reference images not displayed]

FINDINGS: CT HEAD FINDINGS

Brain: There is gross dilation of the lateral and third ventricles
with diffuse effacement of the sulci and periventricular white
matter hypodensity. The fourth ventricle is normal in caliber.

Vascular: No hyperdense vessel or unexpected calcification.

Skull: Normal. Negative for fracture or focal lesion.

Sinuses/Orbits: No acute finding.

Other: None.

CT CERVICAL SPINE FINDINGS

Examination of the cervical spine is performed through the C7
vertebral body.

Alignment: Degenerative straightening of the cervical spine.

Skull base and vertebrae: No acute fracture. No primary bone lesion
or focal pathologic process.

Soft tissues and spinal canal: No prevertebral fluid or swelling. No
visible canal hematoma.

Disc levels: Severe multilevel disc degenerative and osteophytosis.
There is a fusion body of C5-C6.

Upper chest: Negative.

Other: None.
IMPRESSION: 1. There is gross dilation of the lateral and third ventricles with
diffuse effacement of the sulci and periventricular white matter
hypodensity. The fourth ventricle is normal in caliber. Findings are
concerning for hydrocephalus. There is no obvious obstructing lesion
of the aqueduct or midbrain on noncontrast CT.

2. No evidence of acute traumatic injury to the brain.

3. Examination of the cervical spine is performed through the C7
vertebral body. There is degenerative straightening of the cervical
spine and severe multilevel disc degenerative disease without
evidence of fracture or static subluxation.

## 2020-12-14 IMAGING — CT CT LUMBAR SPINE WITHOUT CONTRAST
3 series · 11 of 33 positions shown, 13 images · IV contrast (agent unspecified)
Comparison: CT scan 05/11/2018

CLINICAL DATA: Fell.  Right-sided chest pain.

EXAM:
CT Thoracic and Lumbar spine  Contrast
TECHNIQUE: Multiplanar CT images of the thoracic and lumbar spine were
reconstructed from contemporary CT of the Chest, Abdomen, and Pelvis
CONTRAST:  None or No additional

[Series 4: l spine 2.0 st · axial · 0.32mm/px · z∈[-742,-570]mm · 3 of 141 slices shown, 4 images]
[im 33/141  soft-tissue]
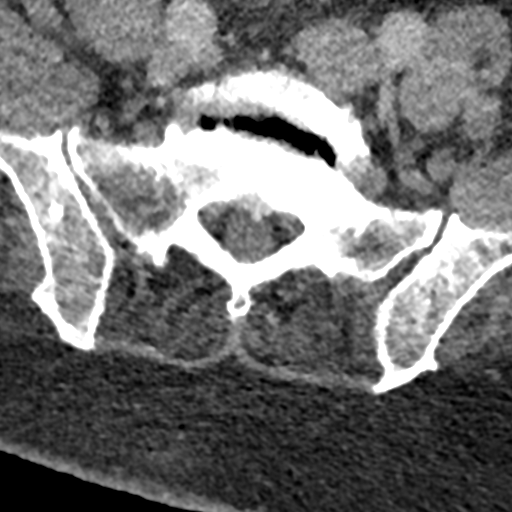
[im 33/141  bone]
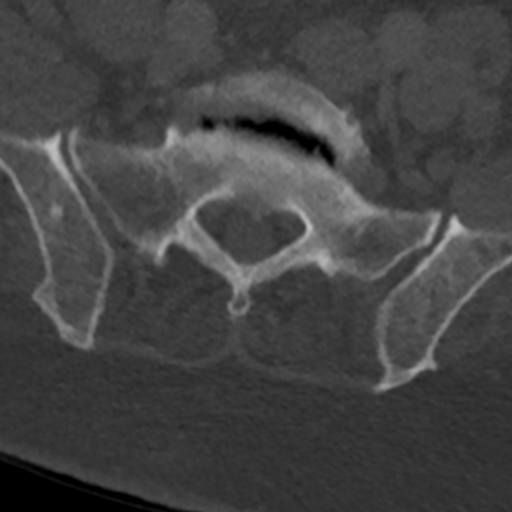
[im 76/141  bone]
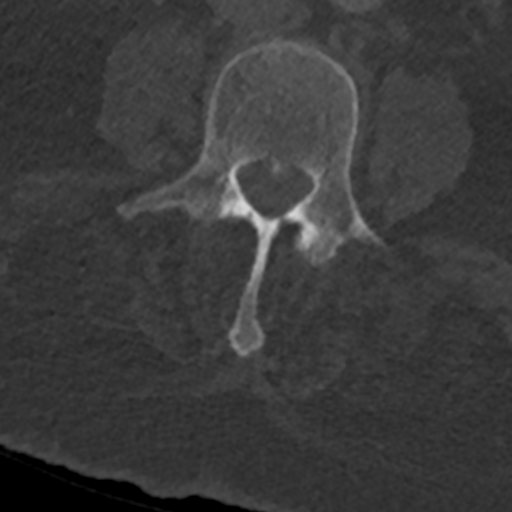
[im 119/141  bone]
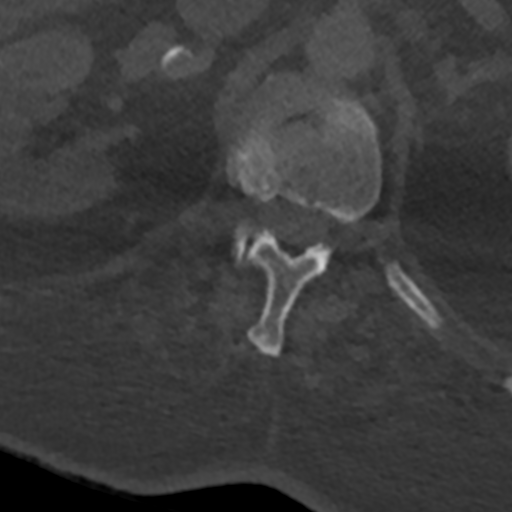

[Series 6: l spine 2.0 · coronal · 0.32mm/px · 3 of 84 slices shown (1 of 2)]
[im 17/84  bone]
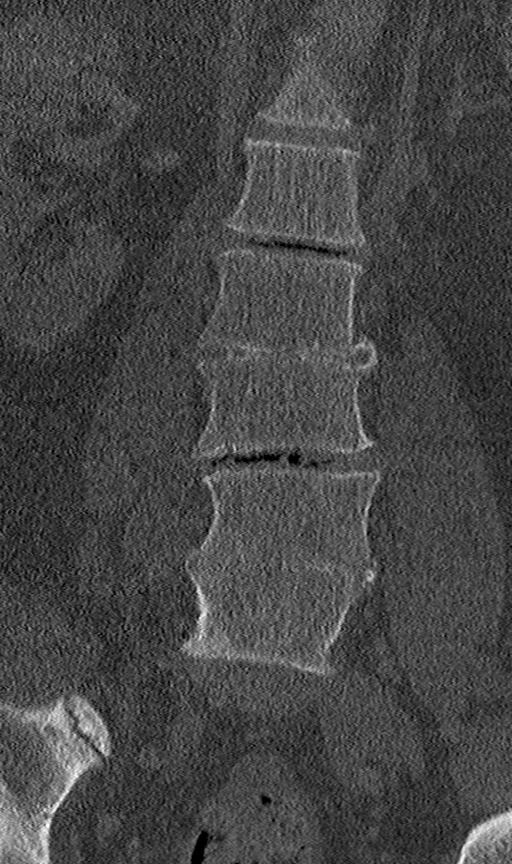
[im 34/84  bone]
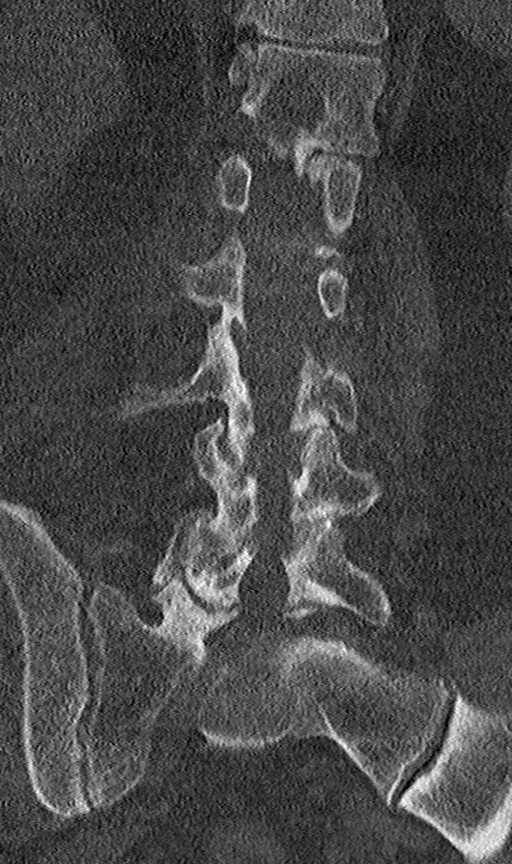
[im 50/84  bone]
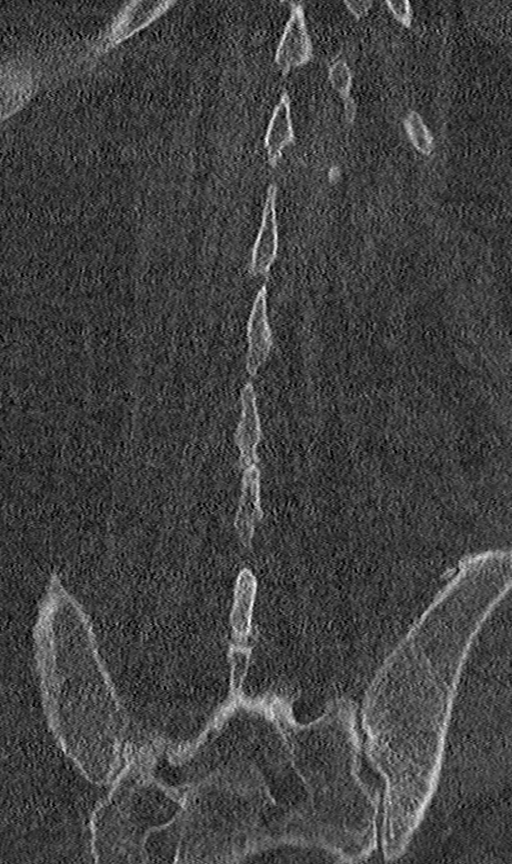

[Series 7: l spine 2.0 · sagittal · 0.32mm/px · 5 of 84 slices shown, 6 images (2 of 2)]
[im 28/84  bone]
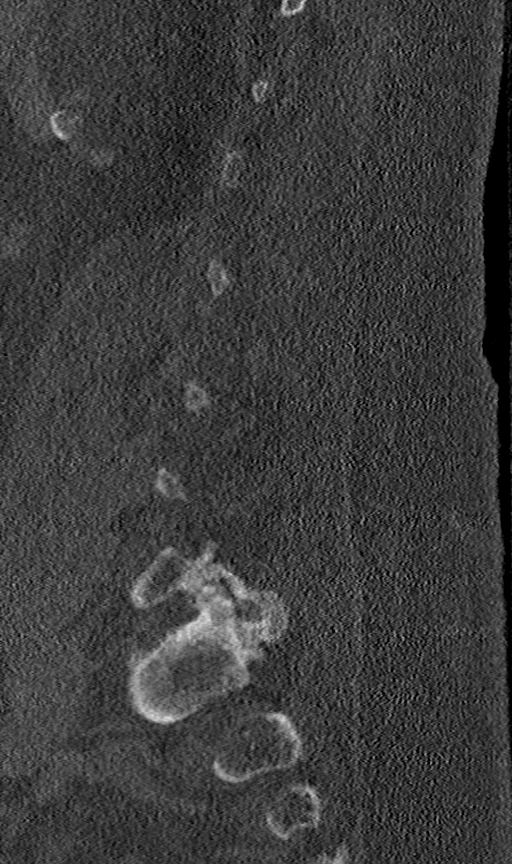
[im 35/84  bone]
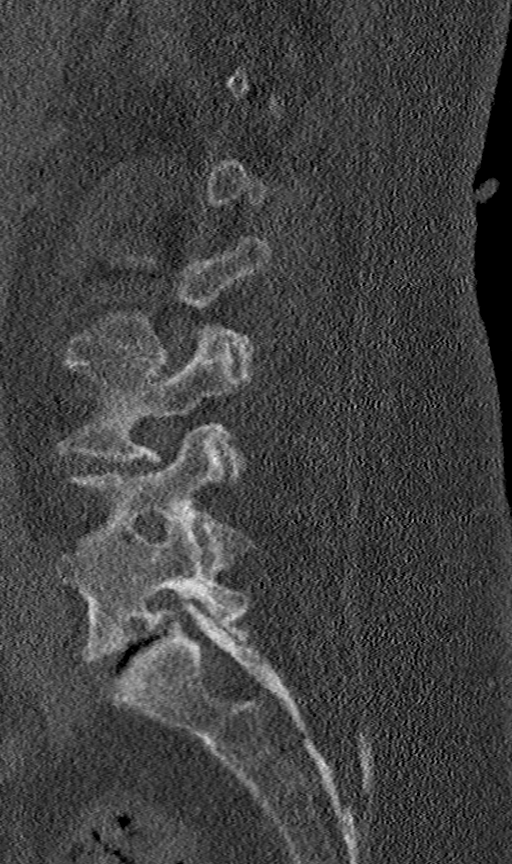
[im 42/84  soft-tissue]
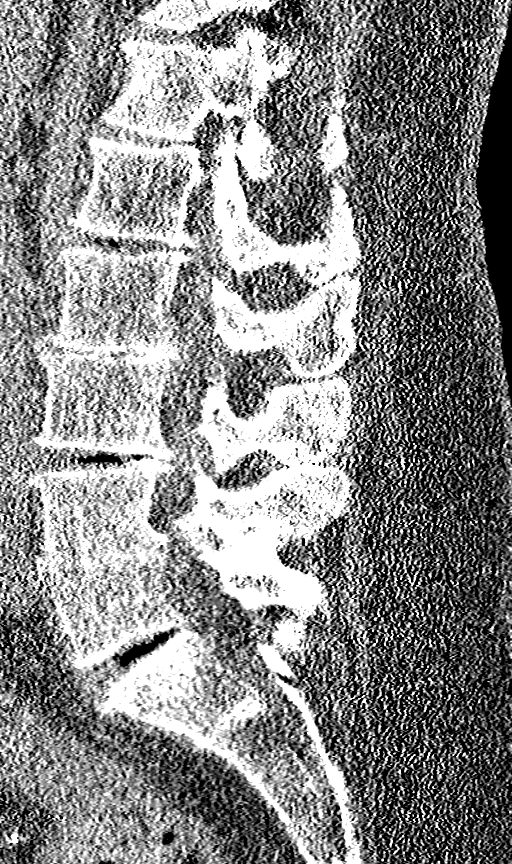
[im 42/84  bone]
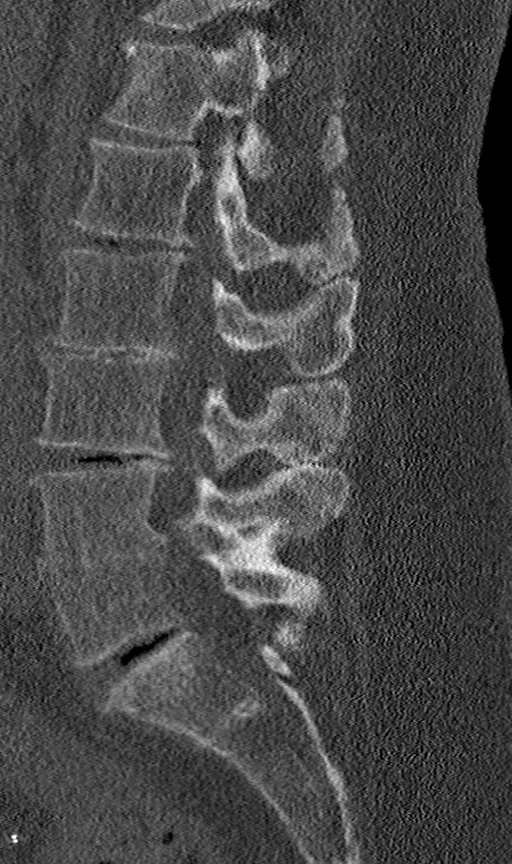
[im 49/84  bone]
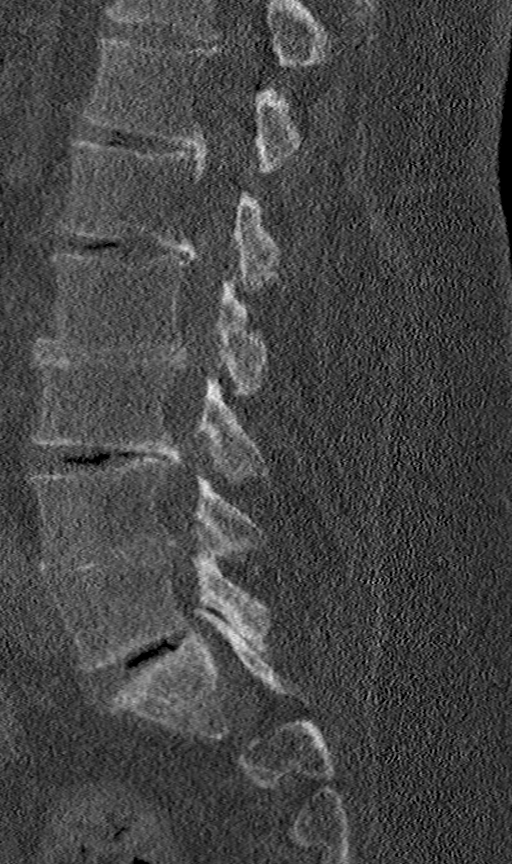
[im 56/84  bone]
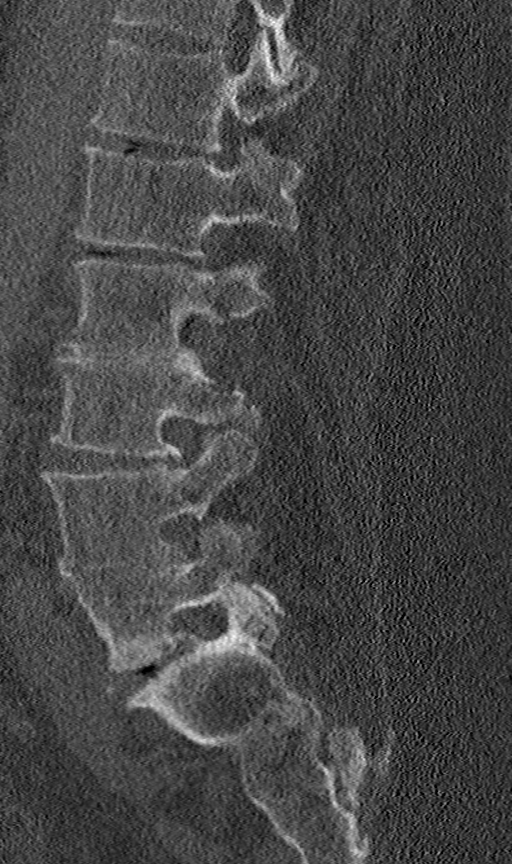

[11 of 33 positions shown; findings below may reference images not displayed]

FINDINGS: CT THORACIC SPINE FINDINGS

Alignment: Mild thoracic scoliosis but normal alignment in the
sagittal plane.

Vertebrae: Moderate degenerative changes but no acute thoracic spine
fracture is identified.

Paraspinal and other soft tissues: No significant paraspinal
findings. No hematoma or mass. Bibasilar atelectasis and a small
right pleural effusion are noted.

No definite posterior rib fractures.

Disc levels: No obvious large disc protrusions or spinal canal
compromise.

CT LUMBAR SPINE FINDINGS

Segmentation: 5 lumbar type vertebral bodies are noted.

Alignment: Normal overall alignment in the sagittal plane.

Vertebrae: No acute lumbar spine fracture is identified. There are
advanced degenerative changes with disc disease and facet disease.
Postoperative changes are also noted with interbody fusion at L4-5
and a left-sided laminectomy. Nearly fused disc space at L2-3 could
surgical.

Paraspinal and other soft tissues: No significant findings.

Disc levels: Osteophytic spurring posteriorly at T12-L1, L1-2, L2-3
and L3-4 with impression on the thecal sac but no significant canal
stenosis.
IMPRESSION: 1. No acute fractures are identified in the thoracic or lumbar
spines.
2. Remote postsurgical changes involving the lumbar spine and
advanced degenerative changes.
